# Patient Record
Sex: Female | Born: 1945
Health system: Southern US, Community
[De-identification: ages and names within clinical notes are randomized; demographics above are authoritative.]

## PROBLEM LIST (undated history)

## (undated) DIAGNOSIS — M1990 Unspecified osteoarthritis, unspecified site: Secondary | ICD-10-CM

## (undated) DIAGNOSIS — R197 Diarrhea, unspecified: Secondary | ICD-10-CM

## (undated) DIAGNOSIS — R202 Paresthesia of skin: Secondary | ICD-10-CM

## (undated) DIAGNOSIS — H269 Unspecified cataract: Secondary | ICD-10-CM

## (undated) DIAGNOSIS — M5126 Other intervertebral disc displacement, lumbar region: Secondary | ICD-10-CM

## (undated) DIAGNOSIS — K219 Gastro-esophageal reflux disease without esophagitis: Secondary | ICD-10-CM

## (undated) DIAGNOSIS — Z973 Presence of spectacles and contact lenses: Secondary | ICD-10-CM

## (undated) DIAGNOSIS — N879 Dysplasia of cervix uteri, unspecified: Secondary | ICD-10-CM

## (undated) DIAGNOSIS — H04123 Dry eye syndrome of bilateral lacrimal glands: Secondary | ICD-10-CM

## (undated) DIAGNOSIS — R200 Anesthesia of skin: Secondary | ICD-10-CM

## (undated) DIAGNOSIS — M545 Low back pain, unspecified: Secondary | ICD-10-CM

## (undated) DIAGNOSIS — I10 Essential (primary) hypertension: Secondary | ICD-10-CM

## (undated) DIAGNOSIS — T7840XA Allergy, unspecified, initial encounter: Secondary | ICD-10-CM

## (undated) DIAGNOSIS — M8580 Other specified disorders of bone density and structure, unspecified site: Secondary | ICD-10-CM

## (undated) DIAGNOSIS — H548 Legal blindness, as defined in USA: Secondary | ICD-10-CM

## (undated) DIAGNOSIS — R3911 Hesitancy of micturition: Secondary | ICD-10-CM

## (undated) DIAGNOSIS — R55 Syncope and collapse: Secondary | ICD-10-CM

## (undated) DIAGNOSIS — R350 Frequency of micturition: Secondary | ICD-10-CM

## (undated) DIAGNOSIS — R609 Edema, unspecified: Secondary | ICD-10-CM

## (undated) HISTORY — PX: LUMBAR LAMINECTOMY: SHX95

## (undated) HISTORY — DX: Other intervertebral disc displacement, lumbar region: M51.26

## (undated) HISTORY — DX: Edema, unspecified: R60.9

## (undated) HISTORY — DX: Other specified disorders of bone density and structure, unspecified site: M85.80

## (undated) HISTORY — DX: Dysplasia of cervix uteri, unspecified: N87.9

## (undated) HISTORY — DX: Low back pain: M54.5

## (undated) HISTORY — PX: UPPER GASTROINTESTINAL ENDOSCOPY: SHX188

## (undated) HISTORY — DX: Low back pain, unspecified: M54.50

## (undated) HISTORY — DX: Allergy, unspecified, initial encounter: T78.40XA

## (undated) HISTORY — PX: COLONOSCOPY: SHX174

## (undated) HISTORY — PX: VAGINAL HYSTERECTOMY: SUR661

---

## 1999-01-21 ENCOUNTER — Emergency Department (HOSPITAL_COMMUNITY): Admission: EM | Admit: 1999-01-21 | Discharge: 1999-01-21 | Payer: Self-pay | Admitting: Emergency Medicine

## 1999-01-26 ENCOUNTER — Other Ambulatory Visit: Admission: RE | Admit: 1999-01-26 | Discharge: 1999-01-26 | Payer: Self-pay | Admitting: Family Medicine

## 2000-09-25 ENCOUNTER — Other Ambulatory Visit: Admission: RE | Admit: 2000-09-25 | Discharge: 2000-09-25 | Payer: Self-pay | Admitting: Family Medicine

## 2004-03-21 ENCOUNTER — Other Ambulatory Visit: Admission: RE | Admit: 2004-03-21 | Discharge: 2004-03-21 | Payer: Self-pay | Admitting: Family Medicine

## 2004-03-26 ENCOUNTER — Encounter: Admission: RE | Admit: 2004-03-26 | Discharge: 2004-03-26 | Payer: Self-pay | Admitting: Orthopedic Surgery

## 2004-04-20 ENCOUNTER — Encounter: Admission: RE | Admit: 2004-04-20 | Discharge: 2004-04-20 | Payer: Self-pay

## 2004-05-16 ENCOUNTER — Ambulatory Visit: Payer: Self-pay | Admitting: Gastroenterology

## 2004-05-17 ENCOUNTER — Ambulatory Visit (HOSPITAL_COMMUNITY): Admission: RE | Admit: 2004-05-17 | Discharge: 2004-05-17 | Payer: Self-pay | Admitting: Gastroenterology

## 2004-05-17 ENCOUNTER — Ambulatory Visit: Payer: Self-pay | Admitting: Gastroenterology

## 2004-07-04 ENCOUNTER — Ambulatory Visit: Payer: Self-pay | Admitting: Gastroenterology

## 2005-04-08 ENCOUNTER — Ambulatory Visit: Payer: Self-pay | Admitting: Family Medicine

## 2005-04-16 ENCOUNTER — Encounter: Payer: Self-pay | Admitting: Family Medicine

## 2005-04-16 ENCOUNTER — Other Ambulatory Visit: Admission: RE | Admit: 2005-04-16 | Discharge: 2005-04-16 | Payer: Self-pay | Admitting: Family Medicine

## 2005-04-16 ENCOUNTER — Ambulatory Visit: Payer: Self-pay | Admitting: Family Medicine

## 2006-08-01 ENCOUNTER — Ambulatory Visit: Payer: Self-pay | Admitting: Family Medicine

## 2006-08-01 LAB — CONVERTED CEMR LAB
ALT: 17 U/L
AST: 20 U/L
Albumin: 3.8 g/dL
Alkaline Phosphatase: 67 U/L
BUN: 8 mg/dL
Basophils Absolute: 0 10*3/uL
Basophils Relative: 0.6 %
CO2: 31 meq/L
Calcium: 9.4 mg/dL
Chloride: 107 meq/L
Cholesterol: 190 mg/dL
Creatinine, Ser: 0.8 mg/dL
Eosinophils Relative: 4.3 %
GFR calc Af Amer: 94 mL/min
GFR calc non Af Amer: 78 mL/min
Glucose, Bld: 92 mg/dL
HCT: 40.6 %
HDL: 52.5 mg/dL
Hemoglobin: 13.7 g/dL
LDL Cholesterol: 112 mg/dL — ABNORMAL HIGH
Lymphocytes Relative: 31.6 %
MCHC: 33.8 g/dL
MCV: 94.2 fL
Monocytes Absolute: 0.7 10*3/uL
Monocytes Relative: 11.9 % — ABNORMAL HIGH
Neutro Abs: 3.2 10*3/uL
Neutrophils Relative %: 51.6 %
Platelets: 233 10*3/uL
Potassium: 3.8 meq/L
RBC: 4.31 M/uL
RDW: 11.9 %
Sodium: 142 meq/L
TSH: 0.99 u[IU]/mL
Total Bilirubin: 0.8 mg/dL
Total CHOL/HDL Ratio: 3.6
Total Protein: 6.5 g/dL
Triglycerides: 126 mg/dL
VLDL: 25 mg/dL
WBC: 6.2 10*3/uL

## 2006-08-12 ENCOUNTER — Ambulatory Visit: Payer: Self-pay | Admitting: Family Medicine

## 2007-03-30 DIAGNOSIS — J309 Allergic rhinitis, unspecified: Secondary | ICD-10-CM | POA: Insufficient documentation

## 2007-04-02 ENCOUNTER — Ambulatory Visit: Payer: Self-pay | Admitting: Family Medicine

## 2007-04-02 DIAGNOSIS — Z8679 Personal history of other diseases of the circulatory system: Secondary | ICD-10-CM | POA: Insufficient documentation

## 2007-05-20 ENCOUNTER — Ambulatory Visit: Payer: Self-pay | Admitting: Family Medicine

## 2007-08-31 ENCOUNTER — Ambulatory Visit: Payer: Self-pay | Admitting: Family Medicine

## 2007-08-31 LAB — CONVERTED CEMR LAB
Blood in Urine, dipstick: NEGATIVE
Glucose, Urine, Semiquant: NEGATIVE
Nitrite: NEGATIVE
Protein, U semiquant: NEGATIVE
Specific Gravity, Urine: >=1.03
Urobilinogen, UA: 0.2
WBC Urine, dipstick: NEGATIVE
pH: 5.5

## 2007-09-09 ENCOUNTER — Other Ambulatory Visit: Admission: RE | Admit: 2007-09-09 | Discharge: 2007-09-09 | Payer: Self-pay | Admitting: Family Medicine

## 2007-09-09 ENCOUNTER — Encounter: Payer: Self-pay | Admitting: Family Medicine

## 2007-09-09 ENCOUNTER — Ambulatory Visit: Payer: Self-pay | Admitting: Family Medicine

## 2007-09-09 DIAGNOSIS — R609 Edema, unspecified: Secondary | ICD-10-CM | POA: Insufficient documentation

## 2007-09-09 DIAGNOSIS — K589 Irritable bowel syndrome without diarrhea: Secondary | ICD-10-CM | POA: Insufficient documentation

## 2007-09-09 DIAGNOSIS — M129 Arthropathy, unspecified: Secondary | ICD-10-CM | POA: Insufficient documentation

## 2007-09-09 LAB — CONVERTED CEMR LAB
ALT: 19 U/L
AST: 21 U/L
Albumin: 3.7 g/dL
Alkaline Phosphatase: 63 U/L
BUN: 10 mg/dL
Basophils Absolute: 0 10*3/uL
Basophils Relative: 0.1 %
Bilirubin, Direct: 0.1 mg/dL
CO2: 30 meq/L
Calcium: 9.2 mg/dL
Chloride: 103 meq/L
Cholesterol: 205 mg/dL
Creatinine, Ser: 0.8 mg/dL
Direct LDL: 124.6 mg/dL
Eosinophils Absolute: 0.2 10*3/uL
Eosinophils Relative: 4.7 %
GFR calc Af Amer: 93 mL/min
GFR calc non Af Amer: 77 mL/min
Glucose, Bld: 91 mg/dL
HCT: 38.7 %
HDL: 49.3 mg/dL
Hemoglobin: 13 g/dL
Lymphocytes Relative: 35.6 %
MCHC: 33.5 g/dL
MCV: 94.3 fL
Monocytes Absolute: 0.5 10*3/uL
Monocytes Relative: 11.2 % — ABNORMAL HIGH
Neutro Abs: 2.4 10*3/uL
Neutrophils Relative %: 48.4 %
Pap Smear: NORMAL
Platelets: 199 10*3/uL
Potassium: 4.2 meq/L
RBC: 4.11 M/uL
RDW: 12 %
Sodium: 140 meq/L
TSH: 1.55 u[IU]/mL
Total Bilirubin: 0.8 mg/dL
Total CHOL/HDL Ratio: 4.2
Total Protein: 6.3 g/dL
Triglycerides: 139 mg/dL
VLDL: 28 mg/dL
WBC: 4.8 10*3/uL

## 2007-09-17 ENCOUNTER — Encounter: Payer: Self-pay | Admitting: Family Medicine

## 2007-09-30 ENCOUNTER — Encounter: Payer: Self-pay | Admitting: Family Medicine

## 2007-09-30 LAB — HM MAMMOGRAPHY

## 2007-11-26 ENCOUNTER — Ambulatory Visit: Payer: Self-pay | Admitting: Family Medicine

## 2008-04-04 ENCOUNTER — Ambulatory Visit: Payer: Self-pay | Admitting: Internal Medicine

## 2008-04-04 DIAGNOSIS — M545 Low back pain, unspecified: Secondary | ICD-10-CM | POA: Insufficient documentation

## 2008-04-04 DIAGNOSIS — M549 Dorsalgia, unspecified: Secondary | ICD-10-CM | POA: Insufficient documentation

## 2008-04-13 ENCOUNTER — Ambulatory Visit: Payer: Self-pay | Admitting: Family Medicine

## 2008-04-13 DIAGNOSIS — M5430 Sciatica, unspecified side: Secondary | ICD-10-CM | POA: Insufficient documentation

## 2008-04-13 DIAGNOSIS — M5126 Other intervertebral disc displacement, lumbar region: Secondary | ICD-10-CM | POA: Insufficient documentation

## 2008-04-15 ENCOUNTER — Encounter: Admission: RE | Admit: 2008-04-15 | Discharge: 2008-04-15 | Payer: Self-pay | Admitting: Family Medicine

## 2008-04-20 ENCOUNTER — Telehealth: Payer: Self-pay | Admitting: Family Medicine

## 2008-04-28 ENCOUNTER — Telehealth: Payer: Self-pay | Admitting: Family Medicine

## 2008-04-28 ENCOUNTER — Encounter: Payer: Self-pay | Admitting: Family Medicine

## 2008-04-28 ENCOUNTER — Ambulatory Visit (HOSPITAL_COMMUNITY): Admission: RE | Admit: 2008-04-28 | Discharge: 2008-04-29 | Payer: Self-pay | Admitting: Neurosurgery

## 2008-05-19 ENCOUNTER — Encounter: Payer: Self-pay | Admitting: Family Medicine

## 2008-06-17 ENCOUNTER — Encounter: Payer: Self-pay | Admitting: Family Medicine

## 2008-08-29 ENCOUNTER — Ambulatory Visit: Payer: Self-pay | Admitting: Family Medicine

## 2008-08-29 LAB — CONVERTED CEMR LAB
Bilirubin Urine: NEGATIVE
Blood in Urine, dipstick: NEGATIVE
Glucose, Urine, Semiquant: NEGATIVE
Ketones, urine, test strip: NEGATIVE
Nitrite: NEGATIVE
Protein, U semiquant: NEGATIVE
Specific Gravity, Urine: 1.015
Urobilinogen, UA: 0.2
WBC Urine, dipstick: NEGATIVE
pH: 5.5

## 2008-09-13 LAB — CONVERTED CEMR LAB
ALT: 17 U/L
AST: 20 U/L
Albumin: 3.8 g/dL
Alkaline Phosphatase: 65 U/L
BUN: 11 mg/dL
Basophils Absolute: 0 10*3/uL
Basophils Relative: 0.4 %
Bilirubin, Direct: 0.1 mg/dL
CO2: 29 meq/L
Calcium: 9.4 mg/dL
Chloride: 106 meq/L
Cholesterol: 188 mg/dL
Creatinine, Ser: 0.6 mg/dL
Eosinophils Absolute: 0.2 10*3/uL
Eosinophils Relative: 3.6 %
GFR calc Af Amer: 130 mL/min
GFR calc non Af Amer: 107 mL/min
Glucose, Bld: 90 mg/dL
HCT: 38.7 %
HDL: 54.4 mg/dL
Hemoglobin: 13.1 g/dL
LDL Cholesterol: 118 mg/dL — ABNORMAL HIGH
Lymphocytes Relative: 28.9 %
MCHC: 33.8 g/dL
MCV: 95.2 fL
Monocytes Absolute: 0.5 10*3/uL
Monocytes Relative: 9.9 %
Neutro Abs: 3 10*3/uL
Neutrophils Relative %: 57.2 %
Platelets: 174 10*3/uL
Potassium: 3.9 meq/L
RBC: 4.07 M/uL
RDW: 11.9 %
Sodium: 141 meq/L
TSH: 1.04 u[IU]/mL
Total Bilirubin: 0.8 mg/dL
Total CHOL/HDL Ratio: 3.5
Total Protein: 6.8 g/dL
Triglycerides: 77 mg/dL
VLDL: 15 mg/dL
Vit D, 25-Hydroxy: 39 ng/mL
WBC: 5.2 10*3/uL

## 2008-09-16 ENCOUNTER — Encounter: Payer: Self-pay | Admitting: Family Medicine

## 2008-09-28 ENCOUNTER — Ambulatory Visit: Payer: Self-pay | Admitting: Family Medicine

## 2008-09-28 DIAGNOSIS — L259 Unspecified contact dermatitis, unspecified cause: Secondary | ICD-10-CM | POA: Insufficient documentation

## 2008-10-18 ENCOUNTER — Encounter: Payer: Self-pay | Admitting: Family Medicine

## 2008-12-09 ENCOUNTER — Encounter (INDEPENDENT_AMBULATORY_CARE_PROVIDER_SITE_OTHER): Payer: Self-pay | Admitting: *Deleted

## 2009-01-20 ENCOUNTER — Ambulatory Visit: Payer: Self-pay | Admitting: Gastroenterology

## 2009-01-27 ENCOUNTER — Ambulatory Visit: Payer: Self-pay | Admitting: Gastroenterology

## 2009-01-27 LAB — HM COLONOSCOPY

## 2009-03-01 ENCOUNTER — Encounter: Payer: Self-pay | Admitting: Family Medicine

## 2009-04-12 ENCOUNTER — Ambulatory Visit: Payer: Self-pay | Admitting: Family Medicine

## 2009-06-07 ENCOUNTER — Ambulatory Visit: Payer: Self-pay | Admitting: Family Medicine

## 2009-06-07 DIAGNOSIS — M479 Spondylosis, unspecified: Secondary | ICD-10-CM | POA: Insufficient documentation

## 2009-06-22 ENCOUNTER — Telehealth: Payer: Self-pay | Admitting: Family Medicine

## 2009-06-27 ENCOUNTER — Ambulatory Visit: Payer: Self-pay | Admitting: Family Medicine

## 2009-07-04 ENCOUNTER — Encounter (INDEPENDENT_AMBULATORY_CARE_PROVIDER_SITE_OTHER): Payer: Self-pay | Admitting: *Deleted

## 2009-07-06 ENCOUNTER — Encounter: Admission: RE | Admit: 2009-07-06 | Discharge: 2009-07-06 | Payer: Self-pay | Admitting: Family Medicine

## 2009-07-11 ENCOUNTER — Encounter: Payer: Self-pay | Admitting: Family Medicine

## 2009-07-12 ENCOUNTER — Encounter: Payer: Self-pay | Admitting: Family Medicine

## 2009-08-08 ENCOUNTER — Encounter: Payer: Self-pay | Admitting: Family Medicine

## 2009-09-15 ENCOUNTER — Ambulatory Visit: Payer: Self-pay | Admitting: Family Medicine

## 2009-09-19 ENCOUNTER — Ambulatory Visit: Payer: Self-pay | Admitting: Family Medicine

## 2009-09-19 DIAGNOSIS — K5289 Other specified noninfective gastroenteritis and colitis: Secondary | ICD-10-CM | POA: Insufficient documentation

## 2009-09-19 LAB — CONVERTED CEMR LAB
ALT: 17 U/L
AST: 17 U/L
Albumin: 3.6 g/dL
Alkaline Phosphatase: 92 U/L
BUN: 19 mg/dL
Basophils Absolute: 0.1 10*3/uL
Basophils Relative: 0.9 %
Bilirubin, Direct: 0 mg/dL
CO2: 30 meq/L
Calcium: 9.2 mg/dL
Chloride: 106 meq/L
Cholesterol: 203 mg/dL — ABNORMAL HIGH
Creatinine, Ser: 0.8 mg/dL
Direct LDL: 136.7 mg/dL
Eosinophils Absolute: 0.1 10*3/uL
Eosinophils Relative: 2.2 %
GFR calc non Af Amer: 76.67 mL/min
Glucose, Bld: 85 mg/dL
HCT: 38.7 %
HDL: 60.6 mg/dL
Hemoglobin: 12.9 g/dL
Lymphocytes Relative: 27.8 %
Lymphs Abs: 1.8 10*3/uL
MCHC: 33.3 g/dL
MCV: 96.2 fL
Monocytes Absolute: 0.8 10*3/uL
Monocytes Relative: 12.3 % — ABNORMAL HIGH
Neutro Abs: 3.7 10*3/uL
Neutrophils Relative %: 56.8 %
Platelets: 322 10*3/uL
Potassium: 4 meq/L
RBC: 4.02 M/uL
RDW: 12.9 %
Sodium: 140 meq/L
TSH: 0.94 u[IU]/mL
Total Bilirubin: 0.3 mg/dL
Total CHOL/HDL Ratio: 3
Total Protein: 6.9 g/dL
Triglycerides: 72 mg/dL
VLDL: 14.4 mg/dL
Vit D, 25-Hydroxy: 34 ng/mL
WBC: 6.5 10*3/uL

## 2009-09-21 ENCOUNTER — Telehealth: Payer: Self-pay | Admitting: Family Medicine

## 2009-09-22 ENCOUNTER — Encounter: Payer: Self-pay | Admitting: Family Medicine

## 2009-10-03 ENCOUNTER — Other Ambulatory Visit: Admission: RE | Admit: 2009-10-03 | Discharge: 2009-10-03 | Payer: Self-pay | Admitting: Family Medicine

## 2009-10-03 ENCOUNTER — Ambulatory Visit: Payer: Self-pay | Admitting: Family Medicine

## 2009-10-03 LAB — CONVERTED CEMR LAB
Bilirubin Urine: NEGATIVE
Blood in Urine, dipstick: NEGATIVE
Glucose, Urine, Semiquant: NEGATIVE
Ketones, urine, test strip: NEGATIVE
Nitrite: NEGATIVE
Protein, U semiquant: NEGATIVE
Specific Gravity, Urine: >=1.03
Urobilinogen, UA: 0.2
WBC Urine, dipstick: NEGATIVE
pH: 5.5

## 2009-10-03 LAB — HM PAP SMEAR

## 2009-10-05 ENCOUNTER — Telehealth: Payer: Self-pay | Admitting: Family Medicine

## 2009-10-05 ENCOUNTER — Encounter: Payer: Self-pay | Admitting: Family Medicine

## 2009-10-25 ENCOUNTER — Encounter: Payer: Self-pay | Admitting: Family Medicine

## 2009-11-01 ENCOUNTER — Encounter: Payer: Self-pay | Admitting: Family Medicine

## 2010-08-12 LAB — CONVERTED CEMR LAB: Pap Smear: NEGATIVE

## 2010-08-16 NOTE — Letter (Signed)
 Summary: [facility]  [facility]   Imported By: Mliss Shutter 03/17/2009 08:21:19  _____________________________________________________________________  External Attachment:    Type:   Image     Comment:   External Document

## 2010-08-16 NOTE — Letter (Signed)
 Summary: Authorizaton for [employer] for [employer] Provider: This provider was preselected by the workflow.  Signature: The signature status of this document was preset by the workflow  Processed by [employer] Local Indexer Client @ Thursday, July 13, 2009 8:56:14 AM using version:2010.1.2.11(2.4)   Manually Indexed By: [id number]  idlBatchDetail: [id number]   _____________________________________________________________________  External Attachment:    Type:   Image     Comment:   External Document

## 2010-08-16 NOTE — Letter (Signed)
 Summary: [facility]  [facility]   Imported By: Mliss Shutter 07/27/2008 15:46:29  _____________________________________________________________________  External Attachment:    Type:   Image     Comment:   External Document

## 2010-08-16 NOTE — Letter (Signed)
 Summary: Recall Colonoscopy Letter  [facility]  [address] [place], [place] [address]   Phone: [phone]  Fax: [phone]      Dec 09, 2008 MRN: [id number]   [name] [place], [place]  [address]   Dear Ms. Satter,   According to your medical record, it is time for you to schedule a Colonoscopy. The American Cancer Society recommends this procedure as a method to detect early colon cancer. Patients with a family history of colon cancer, or a personal history of colon polyps or inflammatory bowel disease are at increased risk.  This letter has beeen generated based on the recommendations made at the time of your procedure. If you feel that in your particular situation this may no longer apply, please contact our office.  Please call our office at [phone] to schedule this appointment or to update your records at your earliest convenience.  Thank you for cooperating with us  to provide you with the very best care possible.   Sincerely,  Alm SAUNDERS. Jakie, M.D.  [facility] [phone]

## 2010-08-16 NOTE — Assessment & Plan Note (Signed)
 Summary: back pain/njr   Vital Signs:  Patient profile:   65 year old female Weight:      145 pounds O2 Sat:      97 % Temp:     97.5 degrees F Pulse rate:   79 / minute Pulse rhythm:   regular BP sitting:   130 / 80  (left arm)  Vitals Entered By: Angeline KANDICE Pellet, RN (June 07, 2009 9:47 AM) CC: left leg ?sciatic and rt leg painful some   History of Present Illness: This 66 year old white female, Vert pleasant however complains of rather severe back pain over the lumbar region with radiation down the left leg. Patient has had back pain since approximately April 28, 2008, had lumbar laminectomy with some improvement however the pain has not ever completely cleared  The patient had increased severity of pain approximately 2 weeks ago with radiation of pain down the left leg of a sciatic distribution, no pain on coughing, no pain radiating down the right leg Patient had been taking only one Celebrex 200 mg capsule daily but has also been taking some hydrocodone  for relief Patient relates she has a bladder problem with some urgency at time in the nose times difficult urination but would like to return for another office visit to evaluatem this problem Blood pressure control  Preventive Screening-Counseling & Management  Alcohol-Tobacco     Smoking Status: quit > 6 months  Allergies (verified): No Known Drug Allergies  Past History:  Risk Factors: Smoking Status: quit > 6 months (06/07/2009)  Past medical, surgical, family and social histories (including risk factors) reviewed, and no changes noted (except as noted below).  Past Medical History: Allergic rhinitis Low back pain herniated lumbar disc  Past Surgical History: Colonoscopy CB x1 Hysterectomy lumbar laminectomy  Family History: Reviewed history and no changes required.  Social History: Reviewed history from 03/30/2007 and no changes required. Former Smoker Smoking Status:  quit > 6 months  Review of  Systems      See HPI General:  Denies chills, fatigue, fever, loss of appetite, malaise, sleep disorder, sweats, weakness, and weight loss. ENT:  Denies decreased hearing, difficulty swallowing, ear discharge, earache, hoarseness, nasal congestion, nosebleeds, postnasal drainage, ringing in ears, sinus pressure, and sore throat. CV:  Denies bluish discoloration of lips or nails, chest pain or discomfort, difficulty breathing at night, difficulty breathing while lying down, fainting, fatigue, leg cramps with exertion, lightheadness, near fainting, palpitations, shortness of breath with exertion, swelling of feet, swelling of hands, and weight gain. Resp:  Denies chest discomfort, chest pain with inspiration, cough, coughing up blood, excessive snoring, hypersomnolence, morning headaches, pleuritic, shortness of breath, sputum productive, and wheezing. GI:  Denies abdominal pain, bloody stools, change in bowel habits, constipation, dark tarry stools, diarrhea, excessive appetite, gas, hemorrhoids, indigestion, loss of appetite, nausea, vomiting, vomiting blood, and yellowish skin color. GU:  Complains of urinary hesitancy; at times urinary urgency. MS:  See HPI; Complains of low back pain.  Physical Exam  General:  Well-developed,well-nourished,in no acute distress; alert,appropriate and cooperative throughout examination Lungs:  Normal respiratory effort, chest expands symmetrically. Lungs are clear to auscultation, no crackles or wheezes. Heart:  Normal rate and regular rhythm. S1 and S2 normal without gallop, murmur, click, rub or other extra sounds. Abdomen:  Bowel sounds positive,abdomen soft and non-tender without masses, organomegaly or hernias noted. Genitalia:  not examined Msk:  Tenderness over the lumbar spine as well as the left sacroiliac joint. No limitation straight leg raise  and Neurologic:  1+ knee reflex bilaterally, absent ankle reflex on the left   Impression &  Recommendations:  Problem # 1:  SCIATICA, ACUTE (ICD-724.3) Assessment Deteriorated  Her updated medication list for this problem includes:    Baby Aspirin  81 Mg Chew (Aspirin )    Hydrocodone -acetaminophen  10-650 Mg Tabs (Hydrocodone -acetaminophen ) .SABRA... 1/2 -1 q4h as needed pain not to exceed 4 per day    Celebrex 200 Mg Caps (Celecoxib) .SABRA... 1 two times a day for arthritis  Orders: Depo- Medrol  80mg  (J1040) Depo- Medrol  40mg  (J1030) Admin of Therapeutic Inj  intramuscular or subcutaneous (03627)  Problem # 2:  HERNIATED LUMBOSACRAL DISC (ICD-722.10) Assessment: Improved  Problem # 3:  LOW BACK PAIN (ICD-724.2) Assessment: Deteriorated  Her updated medication list for this problem includes:    Baby Aspirin  81 Mg Chew (Aspirin )    Hydrocodone -acetaminophen  10-650 Mg Tabs (Hydrocodone -acetaminophen ) .SABRA... 1/2 -1 q4h as needed pain not to exceed 4 per day    Celebrex 200 Mg Caps (Celecoxib) .SABRA... 1 two times a day for arthritis  Problem # 4:  LOW BACK PAIN (ICD-724.2) Assessment: Improved  Her updated medication list for this problem includes:    Baby Aspirin  81 Mg Chew (Aspirin )    Hydrocodone -acetaminophen  10-650 Mg Tabs (Hydrocodone -acetaminophen ) .SABRA... 1/2 -1 q4h as needed pain not to exceed 4 per day    Celebrex 200 Mg Caps (Celecoxib) .SABRA... 1 two times a day for arthritis  Problem # 5:  HYPERTENSION, LABILE, HX OF (ICD-V12.50) Assessment: Improved  Problem # 6:  DEGENERATIVE JOINT DISEASE, LUMBAR SPINE (ICD-721.90) Assessment: Deteriorated  Complete Medication List: 1)  Baby Aspirin  81 Mg Chew (Aspirin ) 2)  Maxzide-25 37.5-25 Mg Tabs (Triamterene-hctz) .... Take 1 once daily 3)  Estrace  1 Mg Tabs (Estradiol ) .SABRA.. 1 once daily 4)  Hydrocodone -acetaminophen  10-650 Mg Tabs (Hydrocodone -acetaminophen ) .... 1/2 -1 q4h as needed pain not to exceed 4 per day 5)  Celebrex 200 Mg Caps (Celecoxib) .SABRA.. 1 two times a day for arthritis 6)  Klor-con  M20 20 Meq Tbcr (Potassium  chloride crys cr) .... Take 1 by mouth once daily 7)  Hydroxyzine Hcl 10 Mg Tabs (Hydroxyzine hcl) .SABRA.. 1 by mouth two times a day 8)  Lotrisone 1-0.05 % Crea (Clotrimazole-betamethasone) .... Apply two times a day for at least 14 days repeaat prn 9)  Lyrica 50 Mg Caps (Pregabalin) .SABRA.. 1 three times a day pc  Patient Instructions: 1)  I think you have degenerative joint disease with some residual arthritic inflammation secondary to surgery. Sciatica is also present 2)  Increase Celebrex to one twice daily 3)  Depo-Medrol  120 mg IM for inflammation 4)  Will start Lyrica 50 mg 3 times daily to help relieve neuropathy and pain 5)  Return in 2 weeks for reevaluation and also to evaluate the bladder problem Prescriptions: HYDROCODONE -ACETAMINOPHEN  10-650 MG  TABS (HYDROCODONE -ACETAMINOPHEN ) 1/2 -1 q4h as needed pain not to exceed 4 per day  #100 x 5   Entered and Authorized by:   Marsha JONELLE Viviann Mickey MD   Signed by:   Marsha JONELLE Viviann Mickey MD on 06/07/2009   Method used:   Print then Give to Patient   RxID:   [id number] LYRICA 50 MG CAPS (PREGABALIN) 1 three times a day pc  #90 x 11   Entered and Authorized by:   Marsha JONELLE Viviann Mickey MD   Signed by:   Marsha JONELLE Viviann Mickey MD on 06/07/2009   Method used:   Print then Give to Patient  RxID:   [id number] CELEBREX 200 MG  CAPS (CELECOXIB) 1 two times a day for arthritis  #60 x 11   Entered and Authorized by:   Marsha JONELLE Viviann Mickey MD   Signed by:   Marsha JONELLE Viviann Mickey MD on 06/07/2009   Method used:   Electronically to        [facility] [phone]* (retail)       [address]       [place], [place]  [address]       Ph: [phone] or [phone]       Fax: [phone]   RxID:   [id number]    Medication Administration  Injection # 1:    Medication: Depo- Medrol  80mg     Diagnosis: SCIATICA, ACUTE (ICD-724.3)    Route: IM    Site: RUOQ gluteus    Exp Date: 04/2010    Lot #: [id number]    Mfr: teva    Patient tolerated  injection without complications    Given by: Angeline KANDICE Pellet, RN (June 07, 2009 11:14 AM)  Injection # 2:    Medication: Depo- Medrol  40mg     Diagnosis: SCIATICA, ACUTE (ICD-724.3)    Route: IM    Site: RUOQ gluteus    Exp Date: 04/2010    Lot #: [id number]    Mfr: teva    Given by: Angeline KANDICE Pellet, RN (June 07, 2009 11:14 AM)  Orders Added: 1)  Depo- Medrol  80mg  [J1040] 2)  Depo- Medrol  40mg  [J1030] 3)  Admin of Therapeutic Inj  intramuscular or subcutaneous [96372] 4)  Est. Patient Level IV [00785]

## 2010-08-16 NOTE — Letter (Signed)
 Summary: [facility]  [facility]   Imported By: Mliss Shutter 06/17/2008 13:25:05  _____________________________________________________________________  External Attachment:    Type:   Image     Comment:   External Document

## 2010-08-16 NOTE — Progress Notes (Signed)
Summary: LETTER FOR DISABILITY   Phone Note Call from Patient   Caller: Patient  [phone] Reason for Call: Talk to Nurse, Talk to Doctor Summary of Call: Pt called to adv that her lawyer has advised her that she needs to get a documented notation on letterhead advising what he has treated pt for and what dx / problems that Dr Scotty Court is aware of, and what pts functional and non-exertional limitations are.  Pt will p/u letter when it is finished... Pt can be reached at [phone]  or  [phone] when same is ready.  Initial call taken by: Debbra Riding,  October 05, 2009 2:38 PM  Follow-up for Phone Call        dr Scotty Court said needs to get this thru her neurosurgeon  Follow-up by: Pura Spice, RN,  October 10, 2009 9:53 AM  Additional Follow-up for Phone Call Additional follow up Details #1::        Pt. notified. Additional Follow-up by: Lynann Beaver CMA,  October 10, 2009 9:55 AM

## 2010-08-16 NOTE — Assessment & Plan Note (Signed)
Summary: cpx/cjr   Vital Signs:  Patient profile:   65 year old female Height:      66 inches Weight:      135 pounds BMI:     21.87 O2 Sat:      97 % Temp:     98.5 degrees F Pulse rate:   108 / minute BP sitting:   120 / 80  (left arm)  Vitals Entered By: Pura Spice, RN (October 03, 2009 1:55 PM) CC: CPX PARTIAL HYST. 1980  Is Patient Diabetic? No   History of Present Illness: This 65 year old female is in for a physical examination as well as discuss her problems with her back since she has had surgery for herniated lumbar disc and since then has received epidural injections appeared she injured herself as far as her back on 17 March and has had some pain but is improved now she had excellent results with the last epidural injection. She complains of leg cramping at night which responds to call plan. Her other complaint is that of urinary frequency which is fine her previous weight was at best fair and will restart this medication. Had a hysterectomy in 1980 and this time for Pap smear Her arthritic pain is felt with Celebrex but at times he does need hydrocodone. We discussed the fact it may be beneficial for her to continue the estradiol 1 mg H. Her episodes of diarrhea and irritable bowel was controlled with Hyoscyamione she is scheduled to see her neurosurgeon regarding future surgery which I do not think she needs to stop and he also agrees  EKG  Procedure date:  10/03/2009  Findings:       sinus rhythm with rate of:  77 possible left atrial abnormality possible old anterior infarct low qrs voltage in limb leads    Problems Prior to Update: 1)  Gastroenteritis, Acute  (ICD-558.9) 2)  Herniated Lumbosacral Disc  (ICD-722.10) 3)  Degenerative Joint Disease, Lumbar Spine  (ICD-721.90) 4)  Eczema  (ICD-692.9) 5)  Physical Examination  (ICD-V70.0) 6)  Sciatica, Acute  (ICD-724.3) 7)  Herniated Lumbosacral Disc  (ICD-722.10) 8)  Back Pain  (ICD-724.5) 9)  Low Back Pain   (ICD-724.2) 10)  Ibs  (ICD-564.1) 11)  Edema  (ICD-782.3) 12)  Arthritis  (ICD-716.90) 13)  Hypertension, Labile, Hx of  (ICD-V12.50) 14)  Allergic Rhinitis  (ICD-477.9)  Allergies: No Known Drug Allergies  Past History:  Past Medical History: Last updated: 06/07/2009 Allergic rhinitis Low back pain herniated lumbar disc  Past Surgical History: Last updated: 06/07/2009 Colonoscopy CB x1 Hysterectomy lumbar laminectomy  Social History: Last updated: 03/30/2007 Former Smoker  Risk Factors: Smoking Status: quit > 6 months (06/07/2009)  Review of Systems  The patient denies anorexia, fever, weight loss, weight gain, vision loss, decreased hearing, hoarseness, chest pain, syncope, dyspnea on exertion, peripheral edema, prolonged cough, headaches, hemoptysis, abdominal pain, melena, hematochezia, severe indigestion/heartburn, hematuria, incontinence, genital sores, muscle weakness, suspicious skin lesions, transient blindness, difficulty walking, depression, unusual weight change, abnormal bleeding, enlarged lymph nodes, angioedema, breast masses, and testicular masses.    Physical Exam  General:  Well-developed,well-nourished,in no acute distress; alert,appropriate and cooperative throughout examination Head:  Normocephalic and atraumatic without obvious abnormalities. No apparent alopecia or balding. Eyes:  No corneal or conjunctival inflammation noted. EOMI. Perrla. Funduscopic exam benign, without hemorrhages, exudates or papilledema. Vision grossly normal. Ears:  External ear exam shows no significant lesions or deformities.  Otoscopic examination reveals clear canals, tympanic membranes are intact bilaterally  without bulging, retraction, inflammation or discharge. Hearing is grossly normal bilaterally. Nose:  External nasal examination shows no deformity or inflammation. Nasal mucosa are pink and moist without lesions or exudates. Mouth:  Oral mucosa and oropharynx without  lesions or exudates.  Teeth in good repair. Neck:  No deformities, masses, or tenderness noted. Chest Wall:  No deformities, masses, or tenderness noted. Breasts:  No mass, nodules, thickening, tenderness, bulging, retraction, inflamation, nipple discharge or skin changes noted.   Lungs:  Normal respiratory effort, chest expands symmetrically. Lungs are clear to auscultation, no crackles or wheezes. Heart:  Normal rate and regular rhythm. S1 and S2 normal without gallop, murmur, click, rub or other extra sounds. Abdomen:  Bowel sounds positive,abdomen soft and non-tender without masses, organomegaly or hernias noted. Rectal:  No external abnormalities noted. Normal sphincter tone. No rectal masses or tenderness. Genitalia:  Normal introitus for age, no external lesions, no vaginal discharge, mucosa pink and moist, no vaginal or cervical lesions, no vaginal atrophy, no friaility or hemorrhage, absent uterus no adnexal masses or tenderness Msk:  No deformity or scoliosis noted of thoracic or lumbar spine.  laminectomy scar but no symptoms of signs of back surgery although was Pulses:  R and L carotid,radial,femoral,dorsalis pedis and posterior tibial pulses are full and equal bilaterally Extremities:  No clubbing, cyanosis, edema, or deformity noted with normal full range of motion of all joints.   Neurologic:  No cranial nerve deficits noted. Station and gait are normal. Plantar reflexes are down-going bilaterally. DTRs are symmetrical throughout. Sensory, motor and coordinative functions appear intact. Skin:  Intact without suspicious lesions or rashes Cervical Nodes:  No lymphadenopathy noted Axillary Nodes:  No palpable lymphadenopathy Inguinal Nodes:  No significant adenopathy Psych:  Cognition and judgment appear intact. Alert and cooperative with normal attention span and concentration. No apparent delusions, illusions, hallucinations   Impression & Recommendations:  Problem # 1:   HERNIATED LUMBOSACRAL DISC (ICD-722.10) Assessment Improved  Problem # 2:  PHYSICAL EXAMINATION (ICD-V70.0) Assessment: Unchanged  Orders: EKG w/ Interpretation (93000)no abnormalities noted  Problem # 3:  LOW BACK PAIN (ICD-724.2) Assessment: Improved  Her updated medication list for this problem includes:    Baby Aspirin 81 Mg Chew (Aspirin)    Hydrocodone-acetaminophen 10-650 Mg Tabs (Hydrocodone-acetaminophen) .Marland Kitchen... 1/2 -1 q4h as needed pain not to exceed 4 per day    Celebrex 200 Mg Caps (Celecoxib) .Marland Kitchen... 1 two times a day for arthritis  Problem # 4:  ECZEMA (ICD-692.9) Assessment: Improved  Problem # 5:  IBS (ICD-564.1) Assessment: Improved  Problem # 6:  HYPERTENSION, LABILE, HX OF (ICD-V12.50) Assessment: Improved  Complete Medication List: 1)  Baby Aspirin 81 Mg Chew (Aspirin) 2)  Maxzide-25 37.5-25 Mg Tabs (Triamterene-hctz) .... Take 1 once daily 3)  Estrace 1 Mg Tabs (Estradiol) .Marland Kitchen.. 1 once daily 4)  Hydrocodone-acetaminophen 10-650 Mg Tabs (Hydrocodone-acetaminophen) .... 1/2 -1 q4h as needed pain not to exceed 4 per day 5)  Celebrex 200 Mg Caps (Celecoxib) .Marland Kitchen.. 1 two times a day for arthritis 6)  Klor-con M20 20 Meq Tbcr (Potassium chloride crys cr) .... Take 1 by mouth once daily 7)  Hydroxyzine Hcl 10 Mg Tabs (Hydroxyzine hcl) .Marland Kitchen.. 1 by mouth two times a day 8)  Lotrisone 1-0.05 % Crea (Clotrimazole-betamethasone) .... Apply two times a day for at least 14 days repeaat prn 9)  Lyrica 50 Mg Caps (Pregabalin) .Marland Kitchen.. 1 three times a day pc 10)  Qualquin 325 Mg  .Marland Kitchen.. 1 each day to prevent  muscle cramps 11)  Hyoscyamine Sulfate 0.125 Mg Tabs (Hyoscyamine sulfate) .... 2 stat then 1 qid ac hs to prevent diarrhea and irritable bowel 12)  Nexium 40 Mg Cpdr (Esomeprazole magnesium) .Marland Kitchen.. 1 stat repeat tonight then 1 qd 13)  Vesicare 10 Mg Tabs (Solifenacin succinate) .Marland Kitchen.. 1 each day for urinary frequency  Patient Instructions: 1)  at this time of acute overwhelmed and  dictated on a daily back surgery. Continue trial of epidural injections for her back problem and I think he would do well 2)  Refill medications 3)  Physical examination revealed a healthy female Prescriptions: NEXIUM 40 MG CPDR (ESOMEPRAZOLE MAGNESIUM) 1 stat repeat tonight then 1 qd  #90 x 3   Entered and Authorized by:   Judithann Sheen MD   Signed by:   Judithann Sheen MD on 10/03/2009   Method used:   Print then Give to Patient   RxID:   [id number] QUALQUIN 325 MG 1 each day to prevent muscle cramps  #90 x 3   Entered and Authorized by:   Judithann Sheen MD   Signed by:   Judithann Sheen MD on 10/03/2009   Method used:   Print then Give to Patient   RxID:   [id number] HYDROXYZINE HCL 10 MG TABS (HYDROXYZINE HCL) 1 by mouth two times a day  #180 x 3   Entered and Authorized by:   Judithann Sheen MD   Signed by:   Judithann Sheen MD on 10/03/2009   Method used:   Print then Give to Patient   RxID:   [id number] KLOR-CON M20 20 MEQ  TBCR (POTASSIUM CHLORIDE CRYS CR) take 1 by mouth once daily  #90 x 3   Entered and Authorized by:   Judithann Sheen MD   Signed by:   Judithann Sheen MD on 10/03/2009   Method used:   Print then Give to Patient   RxID:   [id number] CELEBREX 200 MG  CAPS (CELECOXIB) 1 two times a day for arthritis  #180 x 3   Entered and Authorized by:   Judithann Sheen MD   Signed by:   Judithann Sheen MD on 10/03/2009   Method used:   Print then Give to Patient   RxID:   [id number] ESTRACE 1 MG  TABS (ESTRADIOL) 1 once daily  #90 x 3   Entered and Authorized by:   Judithann Sheen MD   Signed by:   Judithann Sheen MD on 10/03/2009   Method used:   Print then Give to Patient   RxID:   [id number]

## 2010-08-16 NOTE — Letter (Signed)
Summary: Results Follow-up Letter  [place] [facility]  [address] [place], [place] [address]   Phone: [phone]  Fax: [phone]    10/05/2009  [address] [place], [place]  [address]  Dear Ms. Quiros,   The following are the results of your recent test(s):  Test     Result     Pap Smear    Normal____yes ___       Sincerely,      Dr Raoul Pitch MD  [place] [facility]

## 2010-08-16 NOTE — Medication Information (Signed)
Summary: Exception Request for Dexilant  Exception Request for Dexilant   Imported By: Maryln Gottron 10/26/2009 13:53:31  _____________________________________________________________________  External Attachment:    Type:   Image     Comment:   External Document

## 2010-08-16 NOTE — Assessment & Plan Note (Signed)
 Summary: cpx/ccm   Vital Signs:  Patient Profile:   65 Years Old Female Weight:      137 pounds Temp:     98.2 degrees F Pulse rate:   72 / minute BP sitting:   120 / 80  (left arm) Cuff size:   regular  Vitals Entered By: Angeline KANDICE Pellet, RN (September 09, 2007 10:34 AM)                 Chief Complaint:  cpx  labs already drawn.SABRA  History of Present Illness: Pt in for yearly evaluation doing well, under some stress with husband and mother increased gas and abd pain at times with occasional episode of diarrhea    Current Allergies: No known allergies      Review of Systems      See HPI  General      Denies chills, fatigue, fever, loss of appetite, malaise, sleep disorder, sweats, weakness, and weight loss.  ENT      Denies decreased hearing, difficulty swallowing, ear discharge, earache, hoarseness, nasal congestion, nosebleeds, postnasal drainage, ringing in ears, sinus pressure, and sore throat.  CV      Denies bluish discoloration of lips or nails, chest pain or discomfort, difficulty breathing at night, difficulty breathing while lying down, fainting, fatigue, leg cramps with exertion, lightheadness, near fainting, palpitations, shortness of breath with exertion, swelling of feet, swelling of hands, and weight gain.  Resp      Denies chest discomfort, chest pain with inspiration, cough, coughing up blood, excessive snoring, hypersomnolence, morning headaches, pleuritic, shortness of breath, sputum productive, and wheezing.  GI      Complains of abdominal pain and hemorrhoids.      Denies bloody stools, constipation, dark tarry stools, diarrhea, and gas.      increased flatus, occasional diarrhea compatable with IBS  GU      Denies abnormal vaginal bleeding, decreased libido, discharge, dysuria, genital sores, hematuria, incontinence, nocturia, urinary frequency, and urinary hesitancy.  MS      Denies joint pain, joint redness, joint swelling, loss of  strength, low back pain, mid back pain, muscle aches, muscle , cramps, muscle weakness, stiffness, and thoracic pain.  Derm      Denies changes in color of skin, changes in nail beds, dryness, excessive perspiration, flushing, hair loss, insect bite(s), itching, lesion(s), poor wound healing, and rash.  Neuro      Denies brief paralysis, difficulty with concentration, disturbances in coordination, falling down, headaches, inability to speak, memory loss, numbness, poor balance, seizures, sensation of room spinning, tingling, tremors, visual disturbances, and weakness.  Psych      Denies alternate hallucination ( auditory/visual), anxiety, depression, easily angered, easily tearful, irritability, mental problems, panic attacks, sense of great danger, suicidal thoughts/plans, thoughts of violence, unusual visions or sounds, and thoughts /plans of harming others.      some stress or anxiety  Endo      Denies cold intolerance, excessive hunger, excessive thirst, excessive urination, heat intolerance, polyuria, and weight change.  Heme      Denies abnormal bruising, bleeding, enlarge lymph nodes, fevers, pallor, and skin discoloration.  Allergy      Denies hives or rash, itching eyes, persistent infections, seasonal allergies, and sneezing.  ENT      Denies decreased hearing, difficulty swallowing, ear discharge, earache, hoarseness, nasal congestion, nosebleeds, postnasal drainage, ringing in ears, sinus pressure, and sore throat.   Physical Exam  General:     Well-developed,well-nourished,in no acute  distress; alert,appropriate and cooperative throughout examination Head:     Normocephalic and atraumatic without obvious abnormalities. No apparent alopecia or balding. Eyes:     No corneal or conjunctival inflammation noted. EOMI. Perrla. Funduscopic exam benign, without hemorrhages, exudates or papilledema. Vision grossly normal. Ears:     External ear exam shows no significant lesions  or deformities.  Otoscopic examination reveals clear canals, tympanic membranes are intact bilaterally without bulging, retraction, inflammation or discharge. Hearing is grossly normal bilaterally. Nose:     External nasal examination shows no deformity or inflammation. Nasal mucosa are pink and moist without lesions or exudates. Mouth:     Oral mucosa and oropharynx without lesions or exudates.  Teeth in good repair. Neck:     No deformities, masses, or tenderness noted. Chest Wall:     No deformities, masses, or tenderness noted. Breasts:     No mass, nodules, thickening, tenderness, bulging, retraction, inflamation, nipple discharge or skin changes noted.   Lungs:     Normal respiratory effort, chest expands symmetrically. Lungs are clear to auscultation, no crackles or wheezes. Heart:     Normal rate and regular rhythm. S1 and S2 normal without gallop, murmur, click, rub or other extra sounds. Abdomen:     bowel sounds hyperactive.   Rectal:     No external abnormalities noted. Normal sphincter tone. No rectal masses or tenderness. Genitalia:     absent uterus pap done Msk:     No deformity or scoliosis noted of thoracic or lumbar spine.   Pulses:     R and L carotid,radial,femoral,dorsalis pedis and posterior tibial pulses are full and equal bilaterally Extremities:     No clubbing, cyanosis, edema, or deformity noted with normal full range of motion of all joints.   Neurologic:     No cranial nerve deficits noted. Station and gait are normal. Plantar reflexes are down-going bilaterally. DTRs are symmetrical throughout. Sensory, motor and coordinative functions appear intact. Skin:     Intact without suspicious lesions or rashes Cervical Nodes:     No lymphadenopathy noted Axillary Nodes:     No palpable lymphadenopathy Inguinal Nodes:     No significant adenopathy Psych:     Cognition and judgment appear intact. Alert and cooperative with normal attention span and  concentration. No apparent delusions, illusions, hallucinations    Complete Medication List: 1)  Diclofenac Sodium 75 Mg Tbec (Diclofenac sodium) .SABRA.. 1 tab two times a day 2)  Baby Aspirin  81 Mg Chew (Aspirin ) 3)  Alprazolam  0.25 Mg Tabs (Alprazolam ) .SABRA.. 1 tab three times a day for stress 4)  Maxzide-25 37.5-25 Mg Tabs (Triamterene-hctz) .... Take 1 once daily 5)  Estrace  1 Mg Tabs (Estradiol ) .SABRA.. 1 once daily 6)  Hyoscyamine Sulfate Ir/sr 0.375 Mg Tbcr (Hyoscyamine sulfate) .SABRA.. 1 tab two times a day  fir irritable bowel     Prescriptions: VALIUM 5 MG  TABS (DIAZEPAM) q1 three times a day  as needed  for agitation  #90 x 1   Entered and Authorized by:   Marsha JONELLE Viviann Mickey MD   Signed by:   Marsha JONELLE Viviann Mickey MD on 09/09/2007   Method used:   Print then Give to Patient   RxID:   [id number] HYOSCYAMINE SULFATE IR/SR 0.375 MG  TBCR (HYOSCYAMINE SULFATE) 1 tab two times a day  fir irritable bowel  #6o x 11   Entered and Authorized by:   Marsha JONELLE Viviann Mickey MD   Signed by:  Marsha JONELLE Viviann Mickey MD on 09/09/2007   Method used:   Electronically sent to ...       [id number]       [address]       [place], [place]  [address]       Ph: [phone] or [phone]       Fax: [phone]   RxID:   [id number] ALPRAZOLAM  0.25 MG  TABS (ALPRAZOLAM ) 1 tab three times a day for stress  #90 x 5   Entered and Authorized by:   Marsha JONELLE Viviann Mickey MD   Signed by:   Marsha JONELLE Viviann Mickey MD on 09/09/2007   Method used:   Print then Give to Patient   RxID:   [id number] DICLOFENAC SODIUM 75 MG  TBEC (DICLOFENAC SODIUM) 1 tab two times a day  #60 x 11   Entered and Authorized by:   Marsha JONELLE Viviann Mickey MD   Signed by:   Marsha JONELLE Viviann Mickey MD on 09/09/2007   Method used:   Electronically sent to ...       [facility]  [address] [id number]       [address]       [place], [place]  [address]       Ph: [phone] or [phone]       Fax: [phone]   RxID:    [id number] ESTRACE  1 MG  TABS (ESTRADIOL ) 1 once daily  #30 x 11   Entered and Authorized by:   Marsha JONELLE Viviann Mickey MD   Signed by:   Marsha JONELLE Viviann Mickey MD on 09/09/2007   Method used:   Electronically sent to ...       [facility] [address] [id number]       [address]       [place], [place]  [address]       Ph: [phone] or [phone]       Fax: [phone]   RxID:   [id number] MAXZIDE-25 37.5-25 MG  TABS (TRIAMTERENE-HCTZ) take 1 once daily  #90 x 3   Entered by:   Angeline KANDICE Pellet, RN   Authorized by:   Marsha JONELLE Viviann Mickey MD   Signed by:   Marsha JONELLE Viviann Mickey MD on 09/09/2007   Method used:   Print then Give to Patient   RxID:   [id number]  ]

## 2010-08-16 NOTE — Consult Note (Signed)
Summary: [facility]  [facility]   Imported By: Maryln Gottron 08/10/2009 14:00:13  _____________________________________________________________________  External Attachment:    Type:   Image     Comment:   External Document

## 2010-08-16 NOTE — Letter (Signed)
Summary: [facility]  [facility]   Imported By: Maryln Gottron 10/06/2009 13:54:21  _____________________________________________________________________  External Attachment:    Type:   Image     Comment:   External Document

## 2010-08-16 NOTE — Letter (Signed)
 Summary: Results Follow-up Letter  [place] [facility]  [address] [place], [place] [address]   Phone: [phone]  Fax: [phone]    09/17/2007  [address] [place], [place]  [address]  Dear Ms. Sida,   The following are the results of your recent test(s):  Test     Result     Pap Smear    Normal______  Not Normal_____       Comments: _________________________________________________________  _________________________________________________________  _________________________________________________________ _________________________________________________________ _________________________________________________________  Sincerely,  gina , RN [place] at [facility]           Appended Document: Results Follow-up Letter negative

## 2010-08-16 NOTE — Assessment & Plan Note (Signed)
Summary: ? virus//ccm   Vital Signs:  Patient profile:   65 year old female Weight:      135.8 pounds BMI:     22.00 O2 Sat:      98 % Temp:     98.1 degrees F Pulse rate:   67 / minute BP sitting:   120 / 78  (left arm)  Vitals Entered By: Pura Spice, RN (September 19, 2009 10:57 AM) CC: vomiting but thinks resolved diarrhea x 2 this am  nauseous with headache. back surg sch October 12 2009  Is Patient Diabetic? No   History of Present Illness: This 65 year old white married female has been having some vomiting and also epigastric pain associated with nausea. Addition that she had associated headache. The pain in her epigastric and burning along pain with signs of reflux. She has also had 2 episodes of diarrhea today but not at no other time. On February 17 she had an epidural injection and had some nausea and following this He continues to have slight left sciatic pain but is improving since injection  Allergies (verified): No Known Drug Allergies  Past History:  Past Medical History: Last updated: 06/07/2009 Allergic rhinitis Low back pain herniated lumbar disc  Past Surgical History: Last updated: 06/07/2009 Colonoscopy CB x1 Hysterectomy lumbar laminectomy  Social History: Last updated: 03/30/2007 Former Smoker  Risk Factors: Smoking Status: quit > 6 months (06/07/2009)  Review of Systems  The patient denies anorexia, fever, weight loss, weight gain, vision loss, decreased hearing, hoarseness, chest pain, syncope, dyspnea on exertion, peripheral edema, prolonged cough, headaches, hemoptysis, abdominal pain, melena, hematochezia, severe indigestion/heartburn, hematuria, incontinence, genital sores, muscle weakness, suspicious skin lesions, transient blindness, difficulty walking, depression, unusual weight change, abnormal bleeding, enlarged lymph nodes, angioedema, breast masses, and testicular masses.    Physical Exam  General:   Well-developed,well-nourished,in no acute distress; alert,appropriate and cooperative throughout examination Head:  Normocephalic and atraumatic without obvious abnormalities. No apparent alopecia or balding. Eyes:  No corneal or conjunctival inflammation noted. EOMI. Perrla. Funduscopic exam benign, without hemorrhages, exudates or papilledema. Vision grossly normal. Ears:  External ear exam shows no significant lesions or deformities.  Otoscopic examination reveals clear canals, tympanic membranes are intact bilaterally without bulging, retraction, inflammation or discharge. Hearing is grossly normal bilaterally. Nose:  External nasal examination shows no deformity or inflammation. Nasal mucosa are pink and moist without lesions or exudates. Mouth:  Oral mucosa and oropharynx without lesions or exudates.  Teeth in good repair. Lungs:  Normal respiratory effort, chest expands symmetrically. Lungs are clear to auscultation, no crackles or wheezes. Heart:  Normal rate and regular rhythm. S1 and S2 normal without gallop, murmur, click, rub or other extra sounds. Abdomen:  epigastriic tenderness with hyperactive bowel sounds Rectal:  not done Msk:  back exam no limitation straight leg raising Back exam negative   Impression & Recommendations:  Problem # 1:  GASTROENTERITIS, ACUTE (ICD-558.9) Assessment New  Orders: Prescription Created Electronically [phone])  Problem # 2:  HERNIATED LUMBOSACRAL DISC (ICD-722.10) Assessment: Improved  Problem # 3:  DEGENERATIVE JOINT DISEASE, LUMBAR SPINE (ICD-721.90) Assessment: Improved  Problem # 4:  HYPERTENSION, LABILE, HX OF (ICD-V12.50) Assessment: Improved  Complete Medication List: 1)  Baby Aspirin 81 Mg Chew (Aspirin) 2)  Maxzide-25 37.5-25 Mg Tabs (Triamterene-hctz) .... Take 1 once daily 3)  Estrace 1 Mg Tabs (Estradiol) .Marland Kitchen.. 1 once daily 4)  Hydrocodone-acetaminophen 10-650 Mg Tabs (Hydrocodone-acetaminophen) .... 1/2 -1 q4h as needed pain  not to exceed  4 per day 5)  Celebrex 200 Mg Caps (Celecoxib) .Marland Kitchen.. 1 two times a day for arthritis 6)  Klor-con M20 20 Meq Tbcr (Potassium chloride crys cr) .... Take 1 by mouth once daily 7)  Hydroxyzine Hcl 10 Mg Tabs (Hydroxyzine hcl) .Marland Kitchen.. 1 by mouth two times a day 8)  Lotrisone 1-0.05 % Crea (Clotrimazole-betamethasone) .... Apply two times a day for at least 14 days repeaat prn 9)  Lyrica 50 Mg Caps (Pregabalin) .Marland Kitchen.. 1 three times a day pc 10)  Qualquin 325 Mg  .Marland Kitchen.. 1 each day to prevent muscle cramps 11)  Hyoscyamine Sulfate 0.125 Mg Tabs (Hyoscyamine sulfate) .... 2 stat then 1 qid ac hs to prevent diarrhea and irritable bowel 12)  Nexium 40 Mg Cpdr (Esomeprazole magnesium) .Marland Kitchen.. 1 stat repeat tonight then 1 qd  Patient Instructions: 1)  You have gastroenyteritis 2)  No fried greasy spicy foods , low roughage for few days 3)  clear liquids as gatorade today then restart with chicken noodle , potatoe etc , no tomatoe  soup then as nausea and diarrhea subside atart solid food 4)  For mucle cramps start qualquin 1 each day, will prescribe today 5)  return on March 22 or call earlier if not improving Prescriptions: NEXIUM 40 MG CPDR (ESOMEPRAZOLE MAGNESIUM) 1 stat repeat tonight then 1 qd  #30 x 5   Entered and Authorized by:   Judithann Sheen MD   Signed by:   Judithann Sheen MD on 09/19/2009   Method used:   [facility]        [id number]       [address]       [place], [place]  [address]       Ph: [phone] or [phone]       Fax: [phone]   RxID:   [id number] HYOSCYAMINE SULFATE 0.125 MG TABS (HYOSCYAMINE SULFATE) 2 stat then 1 qid ac hs to prevent diarrhea and irritable bowel  #120 x 5   Entered and Authorized by:   Judithann Sheen MD   Signed by:   Judithann Sheen MD on 09/19/2009   Method used:   Print then Give to Patient   RxID:   [id number] QUALQUIN 325 MG 1 each day to prevent muscle cramps  #30 x 5   Entered  and Authorized by:   Judithann Sheen MD   Signed by:   Judithann Sheen MD on 09/19/2009   Method used:   Print then Give to Patient   RxID:   [id number]

## 2010-08-16 NOTE — Assessment & Plan Note (Signed)
 Summary: sciatica nerve//ccm   Vital Signs:  Patient profile:   65 year old female Weight:      139 pounds Temp:     97.9 degrees F oral BP sitting:   142 / 82  Vitals Entered By: Marissa Shropshire CMA (June 27, 2009 8:33 AM) CC: recheck leg and back pain Is Patient Diabetic? No Pain Assessment Patient in pain? yes        History of Present Illness: this 65 year old white female returns complaining of pain in the lower right lumbar spine with pain radiating down the right leg to the foot. Also has numbness and tingling of the right lower leg and foot area has had previous laminectomy but has had recurrence of symptoms and will evaluate and refer back to neurosurgery  Current Medications (verified): 1)  Baby Aspirin  81 Mg  Chew (Aspirin ) 2)  Maxzide-25 37.5-25 Mg  Tabs (Triamterene-Hctz) .... Take 1 Once Daily 3)  Estrace  1 Mg  Tabs (Estradiol ) .SABRA.. 1 Once Daily 4)  Hydrocodone -Acetaminophen  10-650 Mg  Tabs (Hydrocodone -Acetaminophen ) .... 1/2 -1 Q4h As Needed Pain Not To Exceed 4 Per Day 5)  Celebrex 200 Mg  Caps (Celecoxib) .SABRA.. 1 Two Times A Day For Arthritis 6)  Klor-Con  M20 20 Meq  Tbcr (Potassium Chloride  Crys Cr) .... Take 1 By Mouth Once Daily 7)  Hydroxyzine Hcl 10 Mg Tabs (Hydroxyzine Hcl) .SABRA.. 1 By Mouth Two Times A Day 8)  Lotrisone 1-0.05 % Crea (Clotrimazole-Betamethasone) .... Apply Two Times A Day For At Least 14 Days Repeaat Prn 9)  Lyrica 50 Mg Caps (Pregabalin) .SABRA.. 1 Three Times A Day Pc  Allergies (verified): No Known Drug Allergies  Past History:  Past Medical History: Last updated: 06/07/2009 Allergic rhinitis Low back pain herniated lumbar disc  Past Surgical History: Last updated: 06/07/2009 Colonoscopy CB x1 Hysterectomy lumbar laminectomy  Risk Factors: Smoking Status: quit > 6 months (06/07/2009)  Review of Systems  The patient denies anorexia, fever, weight loss, weight gain, vision loss, decreased hearing, hoarseness, chest pain,  syncope, dyspnea on exertion, peripheral edema, prolonged cough, headaches, hemoptysis, abdominal pain, melena, hematochezia, severe indigestion/heartburn, hematuria, incontinence, genital sores, muscle weakness, suspicious skin lesions, transient blindness, difficulty walking, depression, unusual weight change, abnormal bleeding, enlarged lymph nodes, angioedema, breast masses, and testicular masses.    Physical Exam  General:  Well-developed,well-nourished,in no acute distress; alert,appropriate and cooperative throughout examination Lungs:  Normal respiratory effort, chest expands symmetrically. Lungs are clear to auscultation, no crackles or wheezes. Heart:  Normal rate and regular rhythm. S1 and S2 normal without gallop, murmur, click, rub or other extra sounds. Abdomen:  Bowel sounds positive,abdomen soft and non-tender without masses, organomegaly or hernias noted. Rectal:  none exam Msk:  tenderness right lumbosacral spine especially SI joint limitation straight leg raising to 60 right leg, decreased sensation of the foot and lower right leg Pulses:  R and L carotid,radial,femoral,dorsalis pedis and posterior tibial pulses are full and equal bilaterally Extremities:  No clubbing, cyanosis, edema, or deformity noted with normal full range of motion of all joints.     Impression & Recommendations:  Problem # 1:  HERNIATED LUMBOSACRAL DISC (ICD-722.10) Assessment Deteriorated  Orders: Radiology Referral (Radiology)  Problem # 2:  DEGENERATIVE JOINT DISEASE, LUMBAR SPINE (ICD-721.90) Assessment: Deteriorated  Problem # 3:  SCIATICA, ACUTE (ICD-724.3) Assessment: Unchanged  Her updated medication list for this problem includes:    Baby Aspirin  81 Mg Chew (Aspirin )    Hydrocodone -acetaminophen  10-650 Mg Tabs (Hydrocodone -acetaminophen ) .SABRA... 1/2 -1  q4h as needed pain not to exceed 4 per day    Celebrex 200 Mg Caps (Celecoxib) .SABRA... 1 two times a day for arthritis  Problem # 4:   BACK PAIN (ICD-724.5) Assessment: Unchanged  Her updated medication list for this problem includes:    Baby Aspirin  81 Mg Chew (Aspirin )    Hydrocodone -acetaminophen  10-650 Mg Tabs (Hydrocodone -acetaminophen ) .SABRA... 1/2 -1 q4h as needed pain not to exceed 4 per day    Celebrex 200 Mg Caps (Celecoxib) .SABRA... 1 two times a day for arthritis  Problem # 5:  HYPERTENSION, LABILE, HX OF (ICD-V12.50) Assessment: Unchanged  Complete Medication List: 1)  Baby Aspirin  81 Mg Chew (Aspirin ) 2)  Maxzide-25 37.5-25 Mg Tabs (Triamterene-hctz) .... Take 1 once daily 3)  Estrace  1 Mg Tabs (Estradiol ) .SABRA.. 1 once daily 4)  Hydrocodone -acetaminophen  10-650 Mg Tabs (Hydrocodone -acetaminophen ) .... 1/2 -1 q4h as needed pain not to exceed 4 per day 5)  Celebrex 200 Mg Caps (Celecoxib) .SABRA.. 1 two times a day for arthritis 6)  Klor-con  M20 20 Meq Tbcr (Potassium chloride  crys cr) .... Take 1 by mouth once daily 7)  Hydroxyzine Hcl 10 Mg Tabs (Hydroxyzine hcl) .SABRA.. 1 by mouth two times a day 8)  Lotrisone 1-0.05 % Crea (Clotrimazole-betamethasone) .... Apply two times a day for at least 14 days repeaat prn 9)  Lyrica 50 Mg Caps (Pregabalin) .SABRA.. 1 three times a day pc  Patient Instructions: 1)  You have symptoms compatable with herniated disc as prior to previous laminectomy 2)  Prioe to returning to Dr. Alix we will get MRI  LS spine to hasten treatment by Dr. Alix 3)  will call aas soon as scheduled

## 2010-08-16 NOTE — Letter (Signed)
 Summary: [facility]  [facility]   Imported By: Mliss Shutter 10/17/2008 15:39:50  _____________________________________________________________________  External Attachment:    Type:   Image     Comment:   External Document

## 2010-08-16 NOTE — Progress Notes (Signed)
 Summary: Pt has not heard from the surgeons office  Phone Note Call from Patient Call back at Home Phone [phone]   Caller: Patient Call For: [name] Summary of Call: Pt calling to inform Dr [name] that she has not heard from the surgeons' office. Initial call taken by: Inocente Gallon LPN,  April 20, 2008 9:24 AM  Follow-up for Phone Call        dr stafford called Dr Chilton office and awaiting phone call for appt pt called  and informed Follow-up by: Angeline KANDICE Pellet, RN,  April 21, 2008 10:58 AM  Additional Follow-up for Phone Call Additional follow up Details #1::        notified pt and said she went to dr mora office and will be seen thusday oct 15 09 Additional Follow-up by: Angeline KANDICE Pellet, RN,  April 26, 2008 1:37 PM

## 2010-08-16 NOTE — Progress Notes (Signed)
Summary: FYI  Phone Note Call from Patient   Caller: Patient Call For: Judithann Sheen MD Summary of Call: Pt is some better, but believes it is her nerves and will continue what she is doing. [phone] Initial call taken by: Lynann Beaver CMA,  September 21, 2009 10:00 AM  Follow-up for Phone Call        dr stafford aware.  Follow-up by: Pura Spice, RN,  September 21, 2009 11:20 AM

## 2010-08-16 NOTE — Assessment & Plan Note (Signed)
 Summary: flu shot/njr  Nurse Visit   Allergies: No Known Drug Allergies  Orders Added: 1)  Admin 1st Vaccine [90471] 2)  Flu Vaccine 64yrs + [09341] Flu Vaccine Consent Questions     Do you have a history of severe allergic reactions to this vaccine? no    Any prior history of allergic reactions to egg and/or gelatin? no    Do you have a sensitivity to the preservative Thimersol? no    Do you have a past history of Guillan-Barre Syndrome? no    Do you currently have an acute febrile illness? no    Have you ever had a severe reaction to latex? no    Vaccine information given and explained to patient? yes    Are you currently pregnant? no    Lot Number:[id number]   Exp Date:01/11/2010   Site Given  Left Deltoid IM

## 2010-08-16 NOTE — Assessment & Plan Note (Signed)
 Summary: back pain,foot pain/jls   Vital Signs:  Patient Profile:   65 Years Old Female Weight:      132 pounds O2 Sat:      98 % Temp:     98 degrees F Pulse rate:   88 / minute BP sitting:   120 / 80  (left arm)  Vitals Entered By: Angeline KANDICE Pellet, RN (April 13, 2008 2:47 PM)                 Chief Complaint:  sciatiaaic left leg saw dr marla last week was given shot.States feels like pins and needles and numbness left lateral foot.  History of Present Illness: Pt complains of  left leg pain from back radiatiating down left leg, also numbness and pins and needles left lateral foot into toes.  Was seen by Dr. Jannine last week and was given Depomedrol. Symptoms have increased in severity  to point pt can hardly walk and even has pain in bed. Pt iin for evaluation and tx. No known injury. No past hx of this problem.    Current Allergies (reviewed today): No known allergies      Review of Systems      See HPI  General      Denies chills, fatigue, fever, loss of appetite, malaise, sleep disorder, sweats, weakness, and weight loss.  CV      Denies bluish discoloration of lips or nails, chest pain or discomfort, difficulty breathing at night, difficulty breathing while lying down, fainting, fatigue, leg cramps with exertion, lightheadness, near fainting, palpitations, shortness of breath with exertion, swelling of feet, swelling of hands, and weight gain.  GI      Denies abdominal pain, bloody stools, change in bowel habits, constipation, dark tarry stools, diarrhea, excessive appetite, gas, hemorrhoids, indigestion, loss of appetite, nausea, vomiting, vomiting blood, and yellowish skin color.  GU      Denies abnormal vaginal bleeding, decreased libido, discharge, dysuria, genital sores, hematuria, incontinence, nocturia, urinary frequency, and urinary hesitancy.  MS      See HPI   Physical Exam  General:     Well-developed,well-nourished,in no acute distress;  alert,appropriate and cooperative throughout examination Lungs:     Normal respiratory effort, chest expands symmetrically. Lungs are clear to auscultation, no crackles or wheezes. Heart:     Normal rate and regular rhythm. S1 and S2 normal without gallop, murmur, click, rub or other extra sounds. Abdomen:     Bowel sounds positive,abdomen soft and non-tender without masses, organomegaly or hernias noted. Msk:     tender left SI joint and lumbar tenderness Severe pain on Straight leg nraising to 15 degrees hyper reflesic Pulses:     R and L carotid,radial,femoral,dorsalis pedis and posterior tibial pulses are full and equal bilaterally Neurologic:     No cranial nerve deficits noted. Station and gait are normal. Plantar reflexes are down-going bilaterally. DTRs are symmetrical throughout. Sensory, motor and coordinative functions appear intact.    Impression & Recommendations:  Problem # 1:  HERNIATED LUMBOSACRAL DISC (ICD-722.10) Assessment: New  Problem # 2:  SCIATICA, ACUTE (ICD-724.3) Assessment: New  Her updated medication list for this problem includes:    Baby Aspirin  81 Mg Chew (Aspirin )    Hydrocodone -acetaminophen  10-650 Mg Tabs (Hydrocodone -acetaminophen ) .SABRA... 1/2 -1 q4h as needed pain    Celebrex 200 Mg Caps (Celecoxib) .SABRA... 1 two times a day for arthritis   Complete Medication List: 1)  Baby Aspirin  81 Mg Chew (Aspirin ) 2)  Alprazolam   0.25 Mg Tabs (Alprazolam ) .SABRA.. 1 tab three times a day for stress 3)  Maxzide-25 37.5-25 Mg Tabs (Triamterene-hctz) .... Take 1 once daily 4)  Estrace  1 Mg Tabs (Estradiol ) .SABRA.. 1 once daily 5)  Hyoscyamine Sulfate Ir/sr 0.375 Mg Tbcr (Hyoscyamine sulfate) .SABRA.. 1 tab two times a day  fir irritable bowel 6)  Hydrocodone -acetaminophen  10-650 Mg Tabs (Hydrocodone -acetaminophen ) .... 1/2 -1 q4h as needed pain 7)  Celebrex 200 Mg Caps (Celecoxib) .SABRA.. 1 two times a day for arthritis 8)  Klor-con  M20 20 Meq Tbcr (Potassium chloride  crys  cr) .... Take 1 by mouth once daily 9)  Sterapred Ds 12 Day 10 Mg Tabs (Prednisone) .... As directed  Other Orders: Radiology Referral (Radiology)   Patient Instructions: 1)  Pt on bed rest 2)  sterapred dose pk 3)  Lorcet 10-650 1 q4h as needed pain 4)  Scheduled  MRI for noon )ct 2 5)  To refer to Neurosurgeon 6)  My impression is herniated disc L5-S1 7)  Will call as soon as receive results MRI   Prescriptions: STERAPRED DS 12 DAY 10 MG TABS (PREDNISONE) as directed  #1 pkg x 0   Entered and Authorized by:   Marsha JONELLE Viviann Mickey MD   Signed by:   Marsha JONELLE Viviann Mickey MD on 04/19/2008   Method used:   Telephoned to ...       [phone][facility] (retail)       [address]       [place], [place]  [address]       Ph: [phone] or [phone]       Fax: [phone]   RxID:   [id number]  ]

## 2010-08-16 NOTE — Assessment & Plan Note (Signed)
 Summary: Pt concerned about sx with 93/53 standing BP/nn   Vital Signs:  Patient Profile:   65 Years Old Female Weight:      131 pounds Temp:     98.4 degrees F oral Pulse rate:   80 / minute BP sitting:   140 / 90  (left arm) BP standing:   112 / 90  (left arm)  Vitals Entered By: Angeline KANDICE Pellet, RN (April 02, 2007 4:22 PM)                 Chief Complaint:  pt concerned when takes bp at home with her bp machine getting low readings and cncerned.SABRA  History of Present Illness: Pt concerned about low  bp at home with wrist BP manometer ranging from 112 / to 110 sitting and 90/ standing  asymtomatic  BP readings in office with adult cuff 130/ 80 to 140/90 ROS neg in review  no rx needed get new BP manometer  Current Allergies: No known allergies      Review of Systems      See HPI   Physical Exam  General:     Well-developed,well-nourished,in no acute distress; alert,appropriate and cooperative throughout examination Head:     Normocephalic and atraumatic without obvious abnormalities. No apparent alopecia or balding. Eyes:     No corneal or conjunctival inflammation noted. EOMI. Perrla. Funduscopic exam benign, without hemorrhages, exudates or papilledema. Vision grossly normal. Ears:     External ear exam shows no significant lesions or deformities.  Otoscopic examination reveals clear canals, tympanic membranes are intact bilaterally without bulging, retraction, inflammation or discharge. Hearing is grossly normal bilaterally. Nose:     External nasal examination shows no deformity or inflammation. Nasal mucosa are pink and moist without lesions or exudates. Neck:     No deformities, masses, or tenderness noted. Lungs:     Normal respiratory effort, chest expands symmetrically. Lungs are clear to auscultation, no crackles or wheezes. Heart:     Normal rate and regular rhythm. S1 and S2 normal without gallop, murmur, click, rub or other extra  sounds.    Complete Medication List: 1)  Premarin 0.625 Mg Tabs (Estrogens conjugated) 2)  Diclofenac Sodium 75 Mg Tbec (Diclofenac sodium) 3)  Baby Aspirin  81 Mg Chew (Aspirin ) 4)  Alprazolam  0.25 Mg Tabs (Alprazolam ) 5)  Maxzide-25 37.5-25 Mg Tabs (Triamterene-hctz) 6)  Nexium 40 Mg Cpdr (Esomeprazole magnesium)     ]

## 2010-08-16 NOTE — Consult Note (Signed)
 Summary: Dr Alix note  Dr Alix note   Imported By: Doyal Daring 05/14/2008 10:12:54  _____________________________________________________________________  External Attachment:    Type:   Image     Comment:   Dr Alix note

## 2010-08-16 NOTE — Assessment & Plan Note (Signed)
 Summary: flu shot/njr  Nurse Visit    Prior Medications: PREMARIN 0.625 MG  TABS (ESTROGENS CONJUGATED)  DICLOFENAC SODIUM 75 MG  TBEC (DICLOFENAC SODIUM)  BABY ASPIRIN  81 MG  CHEW (ASPIRIN )  ALPRAZOLAM  0.25 MG  TABS (ALPRAZOLAM )  MAXZIDE-25 37.5-25 MG  TABS (TRIAMTERENE-HCTZ)  NEXIUM 40 MG  CPDR (ESOMEPRAZOLE MAGNESIUM)  Current Allergies: No known allergies    Influenza Vaccine    Vaccine Type: Fluvax Non-MCR    Given by: Leeroy Boas, RN  Flu Vaccine Consent Questions    Do you have a history of severe allergic reactions to this vaccine? no    Any prior history of allergic reactions to egg and/or gelatin? no    Do you have a sensitivity to the preservative Thimersol? no    Do you have a past history of Guillan-Barre Syndrome? no    Do you currently have an acute febrile illness? no    Have you ever had a severe reaction to latex? no    Vaccine information given and explained to patient? yes    Are you currently pregnant? no   Impression & Recommendations: lot [id number], exp June 30,2009, [employer], left deltoid IM, 0.5 cc ..................................................................SABRALeeroy Boas, RN  May 20, 2007 2:38 PM   Complete Medication List: 1)  Premarin 0.625 Mg Tabs (Estrogens conjugated) 2)  Diclofenac Sodium 75 Mg Tbec (Diclofenac sodium) 3)  Baby Aspirin  81 Mg Chew (Aspirin ) 4)  Alprazolam  0.25 Mg Tabs (Alprazolam ) 5)  Maxzide-25 37.5-25 Mg Tabs (Triamterene-hctz) 6)  Nexium 40 Mg Cpdr (Esomeprazole magnesium)   Orders Added: 1)  Influenza Vaccine NON MCR [00028]    ]

## 2010-08-16 NOTE — Procedures (Signed)
 Summary: Colonoscopy   Colonoscopy  Procedure date:  01/27/2009  Findings:      Location:  [facility].    Procedures Next Due Date:    Colonoscopy: 02/2019 COLONOSCOPY PROCEDURE REPORT  PATIENT:  [name], [name]  MR#:  [id number] BIRTHDATE:   12-13-1945, 62 yrs. old   GENDER:   female  ENDOSCOPIST:   Alm SAUNDERS. Jakie, MD, [facility] Referred by:   PROCEDURE DATE:  01/27/2009 PROCEDURE:  Colonoscopy, diagnostic ASA CLASS:   Class II INDICATIONS: history of pre-cancerous (adenomatous) colon polyps   MEDICATIONS:    Fentanyl  75 mcg IV, Versed  7 mg IV  DESCRIPTION OF PROCEDURE:   After the risks benefits and alternatives of the procedure were thoroughly explained, informed consent was obtained.  Digital rectal exam was performed and revealed no abnormalities.   The LB [id number] endoscope was introduced through the anus and advanced to the cecum, which was identified by both the appendix and ileocecal valve, without limitations.  The quality of the prep was excellent, using MoviPrep .  The instrument was then slowly withdrawn as the colon was fully examined. <<PROCEDUREIMAGES>>    <<OLD IMAGES>>  FINDINGS:  No polyps or cancers were seen.  This was otherwise a normal examination of the colon.   Retroflexed views in the rectum revealed no abnormalities.    The scope was then withdrawn from the patient and the procedure completed.  COMPLICATIONS:   None  ENDOSCOPIC IMPRESSION:  1) No polyps or cancers  2) Otherwise normal examination RECOMMENDATIONS:  1) You should continue follow current colorectal cancer screening guidelines for routine risk patients with a repeat colonoscopy in 10 years. I do not recommend other colon cancer screening prior to then (including stool tests for microscopic blood) unless new symptoms arise.      REPEAT EXAM:   No   _______________________________ Alm SAUNDERS. Jakie, MD, NOLIA  CC: Elsie Rouse, MD

## 2010-08-16 NOTE — Assessment & Plan Note (Signed)
 Summary: sciatic/mhf   Vital Signs:  Patient Profile:   65 Years Old Female Temp:     99.3 degrees F oral BP sitting:   122 / 82  (left arm) Cuff size:   regular  Vitals Entered By: Dagoberto Jenkins Leak, RN (April 04, 2008 9:35 AM)                 Chief Complaint:  C/o pain tail-bone and back of L leg. Hx sciatica R side.SABRA  History of Present Illness: 65 year old patient presents with a 3-week history of low back pain associate with left leg discomfort.  She has similar problems in the spurring involving the right side ; over the past 3 to 4 days.  She has had worsening pain.  Pain radiates down the posterior aspect of the left leg and may be associated  with some numbness involving the lateral left foot.  Pain is aggravated by walking and movement.  It seems the alleviated by hip flexion.  She states this is able to get countable at night lying supine with her knees pressed against her chest wall.  No constitutional symptoms.  No history of trauma.  She has been using Vicodin without much benefit and has resumed taking Celebrex for the past couple of weeks.  If imaging studies are necessary.  She wishes to wait till after October 1    Current Allergies: No known allergies   Past Medical History:    Allergic rhinitis    Low back pain     Review of Systems       The patient complains of difficulty walking.  The patient denies anorexia, fever, and weight loss.     Physical Exam  General:     alert,  uncomfortable Msk:     there is slight tenderness over the lumbosacral area; the right lumbar musculature was tight and tense, but not tender; the patient was unable to lay supine with her legs and hips extended due to pain; flexion at the hips alleviated the discomfort Neurologic:     patella reflexes were intact with the patient sitting; Achilles reflexes were both blunted; the patient is able to plantar and dorsi flex ankles and toes without difficulty; there may have been  some subjective diminished sensation involving her left lateral foot and toe    Impression & Recommendations:  Problem # 1:  BACK PAIN (ICD-724.5)  Her updated medication list for this problem includes:    Baby Aspirin  81 Mg Chew (Aspirin )    Hydrocodone -acetaminophen  10-650 Mg Tabs (Hydrocodone -acetaminophen ) .SABRA... 1/2 -1 q4h as needed pain    Celebrex 200 Mg Caps (Celecoxib) .SABRA... 1 two times a day for arthritis the patient was given Depo-Medrol  80 mg IM  Problem # 2:  ARTHRITIS (ICD-716.90) Flu Vaccine Consent Questions     Do you have a history of severe allergic reactions to this vaccine? no    Any prior history of allergic reactions to egg and/or gelatin? no    Do you have a sensitivity to the preservative Thimersol? no    Do you have a past history of Guillan-Barre Syndrome? no    Do you currently have an acute febrile illness? no    Have you ever had a severe reaction to latex? no    Vaccine information given and explained to patient? yes    Are you currently pregnant? no    Lot Number:[id number]   Site Given  Left Deltoid IM   Complete Medication List: 1)  Baby Aspirin  81 Mg Chew (Aspirin ) 2)  Alprazolam  0.25 Mg Tabs (Alprazolam ) .SABRA.. 1 tab three times a day for stress 3)  Maxzide-25 37.5-25 Mg Tabs (Triamterene-hctz) .... Take 1 once daily 4)  Estrace  1 Mg Tabs (Estradiol ) .SABRA.. 1 once daily 5)  Hyoscyamine Sulfate Ir/sr 0.375 Mg Tbcr (Hyoscyamine sulfate) .SABRA.. 1 tab two times a day  fir irritable bowel 6)  Hydrocodone -acetaminophen  10-650 Mg Tabs (Hydrocodone -acetaminophen ) .... 1/2 -1 q4h as needed pain 7)  Celebrex 200 Mg Caps (Celecoxib) .SABRA.. 1 two times a day for arthritis 8)  Klor-con  M20 20 Meq Tbcr (Potassium chloride  crys cr) .... Take 1 by mouth once daily  Other Orders: Flu Vaccine 14yrs + (09341) Admin of Therapeutic Inj (IM or [place]) (03627)   Patient Instructions: 1)  Most patients (90%) with low back pain will improve with time (2-6 weeks). Keep active  but avoid activities that are painful. Apply moist heat and/or ice to lower back several times a day. 2)  call if  pain intensifies or  you  develop any leg weakness   ]  Appended Document: sciatic/mhf    Clinical Lists Changes  Orders: Added new Service order of Depo- Medrol  80mg  (J1040) - Signed Added new Service order of Admin of Therapeutic Inj  intramuscular or subcutaneous (03627) - Signed       Medication Administration  Injection # 1:    Medication: Depo- Medrol  80mg     Diagnosis: ARTHRITIS (ICD-716.90)    Route: IM    Site: R deltoid    Exp Date: 11/10    Lot #: [id number]    Mfr: sicor    Patient tolerated injection without complications    Given by: Dagoberto Jenkins Leak, RN (April 04, 2008 10:13 AM)  Orders Added: 1)  Depo- Medrol  80mg  [J1040] 2)  Admin of Therapeutic Inj  intramuscular or subcutaneous [03627]

## 2010-08-16 NOTE — Assessment & Plan Note (Signed)
 Summary: leg pain/jls   Vital Signs:  Patient Profile:   65 Years Old Female Weight:      138 pounds O2 Sat:      97 % Temp:     98.2 degrees F Pulse rate:   78 / minute BP sitting:   120 / 78  (left arm) Cuff size:   regular  Vitals Entered By: Angeline KANDICE Pellet, RN (Nov 26, 2007 10:56 AM)                 Chief Complaint:  rt hip and knee painful .  History of Present Illness: over past month pt has had increased pain inrt hip and rt knee, painful on walking at times at rest needs tx and analgesics IBS doing fine stress under good controll  no other complaints    Current Allergies (reviewed today): No known allergies      Review of Systems      See HPI  MS      pain rt hip and knee   Physical Exam  General:     Well-developed,well-nourished,in no acute distress; alert,appropriate and cooperative throughout examination Lungs:     Normal respiratory effort, chest expands symmetrically. Lungs are clear to auscultation, no crackles or wheezes. Heart:     Normal rate and regular rhythm. S1 and S2 normal without gallop, murmur, click, rub or other extra sounds. Abdomen:     Bowel sounds positive,abdomen soft and non-tender without masses, organomegaly or hernias noted. Msk:     tender rt hip, tender and swollen rt knee Extremities:     No clubbing, cyanosis, edema, or deformity noted with normal full range of motion of all joints.      Impression & Recommendations:  Problem # 1:  ARTHRITIS (ICD-716.90) Assessment: Deteriorated  Problem # 2:  EDEMA (ICD-782.3) Assessment: Improved  Her updated medication list for this problem includes:    Maxzide-25 37.5-25 Mg Tabs (Triamterene-hctz) .SABRA... Take 1 once daily   Problem # 3:  IBS (ICD-564.1) Assessment: Improved  Problem # 4:  HYPERTENSION, LABILE, HX OF (ICD-V12.50) Assessment: Unchanged  Complete Medication List: 1)  Baby Aspirin  81 Mg Chew (Aspirin ) 2)  Alprazolam  0.25 Mg Tabs (Alprazolam ) .SABRA..  1 tab three times a day for stress 3)  Maxzide-25 37.5-25 Mg Tabs (Triamterene-hctz) .... Take 1 once daily 4)  Estrace  1 Mg Tabs (Estradiol ) .SABRA.. 1 once daily 5)  Hyoscyamine Sulfate Ir/sr 0.375 Mg Tbcr (Hyoscyamine sulfate) .SABRA.. 1 tab two times a day  fir irritable bowel 6)  Hydrocodone -acetaminophen  10-650 Mg Tabs (Hydrocodone -acetaminophen ) .... 1/2 -1 q4h as needed pain 7)  Celebrex 200 Mg Caps (Celecoxib) .SABRA.. 1 two times a day for arthritis 8)  Klor-con  M20 20 Meq Tbcr (Potassium chloride  crys cr) .... Take 1 by mouth once daily   Patient Instructions: 1)  to start celebrex 200 mg two times a day 2)  add K+ to diet 3)  hydrocodone  for pain 4)  if increases in severity to Xray   Prescriptions: KLOR-CON  M20 20 MEQ  TBCR (POTASSIUM CHLORIDE  CRYS CR) take 1 by mouth once daily  #30 x 11   Entered by:   Regina G Hudy, RN   Authorized by:   Marsha JONELLE Viviann Mickey MD   Signed by:   Angeline KANDICE Pellet, RN on 12/08/2007   Method used:   Electron[facility] sent to ...       [facility]  [address] [id number]       [address]  [address]       [place], [place]  [address]       Ph: [phone] or [phone]       Fax: [phone]   RxID:   [id number] CELEBREX 200 MG  CAPS (CELECOXIB) 1 two times a day for arthritis  #60 x 11   Entered and Authorized by:   Marsha JONELLE Viviann Mickey MD   Signed by:   Marsha JONELLE Viviann Mickey MD on 11/26/2007   Method used:   Print then Give to Patient   RxID:   [id number] HYDROCODONE -ACETAMINOPHEN  10-650 MG  TABS (HYDROCODONE -ACETAMINOPHEN ) 1/2 -1 q4h as needed pain  #100 x 5   Entered and Authorized by:   Marsha JONELLE Viviann Mickey MD   Signed by:   Marsha JONELLE Viviann Mickey MD on 11/26/2007   Method used:   Print then Give to Patient   RxID:   [id number]  ]

## 2010-08-16 NOTE — Miscellaneous (Signed)
Summary: Orders Update  Clinical Lists Changes  Orders: Added new Referral order of Neurosurgeon Referral (Neurosurgeon) - Signed 

## 2010-08-16 NOTE — Letter (Signed)
Summary: [facility]  [facility]   Imported By: Maryln Gottron 09/20/2009 14:17:53  _____________________________________________________________________  External Attachment:    Type:   Image     Comment:   External Document

## 2010-10-10 ENCOUNTER — Encounter: Payer: Self-pay | Admitting: Family Medicine

## 2010-11-27 NOTE — Op Note (Signed)
[name], [name]               ACCOUNT NO.:  [id number]   MEDICAL RECORD NO.:  [id number]          PATIENT TYPE:  AMB   LOCATION:  SDS                          FACILITY:  [facility]   PHYSICIAN:  [name], M.D.DATE OF BIRTH:  August 09, 1945   DATE OF PROCEDURE:  04/28/2008  DATE OF DISCHARGE:                               OPERATIVE REPORT   PREOPERATIVE DIAGNOSES:  1. Left L5-S1 lumbar disk herniation.  2. Lumbar degenerative disk disease.  3. Lumbar spondylosis.  4. Lumbar radiculopathy.   POSTOPERATIVE DIAGNOSES:  1. Left L5-S1 lumbar disk herniation.  2. Lumbar degenerative disk disease.  3. Lumbar spondylosis.  4. Lumbar radiculopathy.   PROCEDURES:  Left L5-S1 lumbar laminotomy and microdiskectomy with  microdissection.   SURGEON:  [name], M.D.   ASSISTANTS:  1. [name], M.D..   ANESTHESIA:  General endotracheal.   INDICATIONS:  The patient is a 65 year old woman who presented with left  lumbar radiculopathy who was found by MRI scan to have a large left L5-  S1 lumbar disk herniation.  Decision was made to proceed for elective  laminotomy and microdiskectomy.   DESCRIPTION OF PROCEDURE:  The patient was brought to the operating room  and placed under general endotracheal anesthesia.  The patient was  turned to a prone position.  Lumbar region was prepped with Betadine  soap and solution and draped in a sterile fashion.  The midline was  infiltrated with local anesthetic with epinephrine and x-ray was taken.  The L5-S1 level was identified and a midline incision was made over the  L5-S1 level and carried down through the subcutaneous tissue.  Bipolar  cautery and electrocautery maintained hemostasis.  Dissection was  carried down to the lumbar fascia, which was incised on the left side of  the midline and the paraspinal muscles were dissected from the spinous  process and lamina in a subperiosteal fashion.  The L5-S1 intralaminar  space was identified.  X-ray was taken to confirm the localization and  then the microscope was draped and brought to the field to provide  additional navigation, illumination, and visualization.  The remainder  of the decompression was performed using microdissection and  microsurgical technique.  Laminotomy was performed using the X-Max drill  and Kerrison punches.  The ligamentum flavum was carefully removed and  then we identified the thecal sac and exiting left S1 nerve root.  The  structures were retracted medially and the disk herniation was  identified immediately ventral to the left S1 nerve root.  We were able  to incise the remaining annular fibers and the fragment extruded.  We  continued to remove the fragment and then enter into the disk space and  continue with a thorough diskectomy removing all loose fragments of disk  material from the disk space with decompression of thecal sac and nerve  root.  At the end, a thorough diskectomy was performed, all loose  fragments of the disk material were removed from both the disk space and  the epidural space.  Hemostasis was established with the use of bipolar  cautery and Gelfoam soaked in thrombin.  The Gelfoam was removed.  The  wounds were irrigated with Bacitracin solution.  Hemostasis was  confirmed and then we instilled 2 mL of fentanyl and 80 mg of Depo-  Medrol into the epidural space and proceeded with closure.  The deep  fascia was closed with interrupted undyed #1 Vicryl sutures.  The  subcutaneous and subcuticular were closed with interrupted inverted 3-0  undyed Vicryl sutures.  The skin was approximated with Dermabond.  The  procedure was tolerated well.  The estimated blood loss was 10 mL.  Sponge and needle count were correct.  Following the surgery, the  patient was turned back to the supine position, reversed from the  anesthetic, extubated, and transferred to the recovery room for further  care.      [name], M.D.  Electronically Signed     RWN/MEDQ  D:  04/28/2008  T:  04/29/2008  Job:  [id number]

## 2011-04-16 LAB — CBC
HCT: 40.1
Hemoglobin: 13.5
MCHC: 33.8
MCV: 95.6
Platelets: 201
RBC: 4.19
RDW: 12.7
WBC: 6.2

## 2011-06-10 ENCOUNTER — Encounter: Payer: Self-pay | Admitting: Gynecology

## 2011-06-10 ENCOUNTER — Ambulatory Visit (INDEPENDENT_AMBULATORY_CARE_PROVIDER_SITE_OTHER): Payer: Medicare Other | Admitting: Gynecology

## 2011-06-10 VITALS — BP 112/70 | Ht 64.25 in | Wt 142.0 lb

## 2011-06-10 DIAGNOSIS — R609 Edema, unspecified: Secondary | ICD-10-CM | POA: Insufficient documentation

## 2011-06-10 DIAGNOSIS — K644 Residual hemorrhoidal skin tags: Secondary | ICD-10-CM

## 2011-06-10 DIAGNOSIS — N952 Postmenopausal atrophic vaginitis: Secondary | ICD-10-CM

## 2011-06-10 NOTE — Progress Notes (Signed)
[name] 03-Apr-1946 [id number]        65 y.o. new patient for follow up.  Normal he saw Dr. Rickard Patience who has now retired she wants to establish care with another physician from a gynecologic standpoint. She has a history of abnormal Pap smear 30 years ago underwent hysterectomy following a pregnancy and has had normal Pap smears since then.  Past medical history,surgical history, medications, allergies, family history and social history were all reviewed and documented in the EPIC chart. ROS:  Was performed and pertinent positives and negatives are included in the history.  Exam: chaperone present Filed Vitals:   06/10/11 1512  BP: 112/70   General appearance  Normal Skin grossly normal Head/Neck normal with no cervical or supraclavicular adenopathy thyroid normal Lungs  clear Cardiac RR, without RMG Abdominal  soft, nontender, without masses, organomegaly or hernia Breasts  examined lying and sitting without masses, retractions, discharge or axillary adenopathy. Pelvic  Ext/BUS/vagina  normal with atrophic genital changes  Adnexa  Without masses or tenderness    Anus and perineum  normal   Rectovaginal  normal sphincter tone without palpated masses or tenderness. Old external hemorrhoids   Assessment/Plan:  65 y.o. female for follow up.   Exam shows atrophic genital changes. The patient is asymptomatic from this and we'll plan expectant management. Gives history of abnormal Pap smear 30 years ago. Review of her chart shows her last 3 Pap smears were normal, the last being in March 2011. I discussed new screening guidelines she is over 73 although she does have a history of abnormality given that it was 30 years ago. I do not believe we will be able to get pathology from her hysterectomy but we will try.  At this point I have recommended that we continue with Pap smears although at a less frequent interval every 3 years and she is comfortable with this. I did not do a Pap  smear today.  SBE on a monthly basis discussed. Had mammography earlier this year which, after repeat views was normal. We'll continue annual mammography. She did have a bone density also, was told there was some weakness in her wrists but there was no recommended follow up. I am going to get a copy of this. Colonoscopy 2010. No blood work was done today as this is all done through her primary. Assuming she continues well from a gynecologic standpoint she will see me in a year sooner as needed.    Dara Lords MD, 4:02 PM 06/10/2011

## 2012-06-25 ENCOUNTER — Ambulatory Visit (INDEPENDENT_AMBULATORY_CARE_PROVIDER_SITE_OTHER): Payer: Medicare Other | Admitting: Gynecology

## 2012-06-25 ENCOUNTER — Encounter: Payer: Self-pay | Admitting: Gynecology

## 2012-06-25 VITALS — BP 120/72 | Ht 64.25 in | Wt 131.0 lb

## 2012-06-25 DIAGNOSIS — K649 Unspecified hemorrhoids: Secondary | ICD-10-CM

## 2012-06-25 DIAGNOSIS — N952 Postmenopausal atrophic vaginitis: Secondary | ICD-10-CM

## 2012-06-25 NOTE — Progress Notes (Signed)
KHALIAH BARNICK Nov 12, 1945 [id number]        66 y.o.  G1P1 for follow up exam.    Past medical history,surgical history, medications, allergies, family history and social history were all reviewed and documented in the EPIC chart. ROS:  Was performed and pertinent positives and negatives are included in the history.  Exam: Sherrilyn Rist assistant Filed Vitals:   06/25/12 1119  BP: 120/72  Height: 5' 4.25" (1.632 m)  Weight: 131 lb (59.421 kg)   General appearance  Normal Skin grossly normal Head/Neck normal with no cervical or supraclavicular adenopathy thyroid normal Lungs  clear Cardiac RR, without RMG Abdominal  soft, nontender, without masses, organomegaly or hernia Breasts  examined lying and sitting without masses, retractions, discharge or axillary adenopathy. Pelvic  Ext/BUS/vagina  normal with atrophic changes  Adnexa  Without masses or tenderness    Anus and perineum  normal   Rectovaginal  normal sphincter tone without palpated masses or tenderness. Old external hemorrhoids.   Assessment/Plan:  66 y.o. G1P1 female for follow up exam, status post vaginal hysterectomy in the past..   1. Postmenopausal/atrophic genital changes. Patient is asymptomatic and we'll continue to monitor. 2. External hemorrhoids, old. Asymptomatic. We'll continue to monitor. 3. Mammography 2013. We'll continue with annual mammography. SBE monthly reviewed. 4. Pap smear 2011. No Pap smear done today. Current screening guidelines reviewed and the options to stop screening altogether she is status post vaginal hysterectomy with no history of abnormal Pap smears versus less frequent screening discussed. We'll readdress on an annual basis. 5. Bone health. Had DEXA 2 years ago historically. Do not have this report and we'll attempt to retrieve. She is being followed for this by her primary to continue to see him in reference to this. 6. Health maintenance. No blood work done today as it was all done through her  primary physician's office who she sees on a regular basis. Follow up one year, sooner as needed.    Dara Lords MD, 11:54 AM 06/25/2012

## 2012-06-25 NOTE — Patient Instructions (Addendum)
Follow up one year, sooner as needed.

## 2012-06-26 LAB — URINALYSIS W MICROSCOPIC + REFLEX CULTURE
Bilirubin Urine: NEGATIVE
Casts: NONE SEEN
Crystals: NONE SEEN
Glucose, UA: NEGATIVE mg/dL
Hgb urine dipstick: NEGATIVE
Ketones, ur: NEGATIVE mg/dL
Leukocytes, UA: NEGATIVE
Nitrite: NEGATIVE
Protein, ur: NEGATIVE mg/dL
Specific Gravity, Urine: 1.008 (ref 1.005–1.030)
Squamous Epithelial / HPF: NONE SEEN
Urobilinogen, UA: 0.2 mg/dL (ref 0.0–1.0)
pH: 7 (ref 5.0–8.0)

## 2012-06-27 LAB — URINE CULTURE
Colony Count: NO GROWTH
Organism ID, Bacteria: NO GROWTH

## 2013-06-28 ENCOUNTER — Encounter: Payer: Medicare Other | Admitting: Gynecology

## 2013-06-29 ENCOUNTER — Encounter: Payer: Self-pay | Admitting: Gynecology

## 2013-06-29 ENCOUNTER — Other Ambulatory Visit (HOSPITAL_COMMUNITY)
Admission: RE | Admit: 2013-06-29 | Discharge: 2013-06-29 | Disposition: A | Discharge status: Home / Self Care | Payer: Medicare Other | Source: Ambulatory Visit | Attending: Gynecology | Admitting: Gynecology

## 2013-06-29 ENCOUNTER — Ambulatory Visit (INDEPENDENT_AMBULATORY_CARE_PROVIDER_SITE_OTHER): Payer: Medicare Other | Admitting: Gynecology

## 2013-06-29 VITALS — BP 116/74 | Ht 64.0 in | Wt 135.0 lb

## 2013-06-29 DIAGNOSIS — M8580 Other specified disorders of bone density and structure, unspecified site: Secondary | ICD-10-CM

## 2013-06-29 DIAGNOSIS — Z124 Encounter for screening for malignant neoplasm of cervix: Secondary | ICD-10-CM | POA: Insufficient documentation

## 2013-06-29 DIAGNOSIS — N952 Postmenopausal atrophic vaginitis: Secondary | ICD-10-CM

## 2013-06-29 DIAGNOSIS — Z8742 Personal history of other diseases of the female genital tract: Secondary | ICD-10-CM

## 2013-06-29 DIAGNOSIS — M899 Disorder of bone, unspecified: Secondary | ICD-10-CM

## 2013-06-29 DIAGNOSIS — Z79890 Hormone replacement therapy (postmenopausal): Secondary | ICD-10-CM

## 2013-06-29 DIAGNOSIS — Z87898 Personal history of other specified conditions: Secondary | ICD-10-CM

## 2013-06-29 NOTE — Progress Notes (Signed)
[name] July 17, 1945 [id number]        67 y.o.  G1P1 for followup exam.  Several issues noted below.  Past medical history,surgical history, problem list, medications, allergies, family history and social history were all reviewed and documented in the EPIC chart.  ROS:  Performed and pertinent positives and negatives are included in the history, assessment and plan .  Exam: Kim assistant Filed Vitals:   06/29/13 1114  BP: 116/74  Height: 5\' 4"  (1.626 m)  Weight: 135 lb (61.236 kg)   General appearance  Normal Skin grossly normal Head/Neck normal with no cervical or supraclavicular adenopathy thyroid normal Lungs  clear Cardiac RR, without RMG Abdominal  soft, nontender, without masses, organomegaly or hernia Breasts  examined lying and sitting without masses, retractions, discharge or axillary adenopathy. Pelvic  Ext/BUS/vagina  Normal with mild atrophic changes. Pap of cuff done   Adnexa  Without masses or tenderness    Anus and perineum  Normal   Rectovaginal  Normal sphincter tone without palpated masses or tenderness.    Assessment/Plan:  67 y.o. G1P1 female for followup exam.   1. Postmenopausal/ERT, status post TVH for abnormal Pap smear. Patient on Estrace 1 mg daily and has been for a number of years. This was not recognized previously when I saw her. Originally started for hot flashes and sweats.  I reviewed the whole issue of HRT with her to include the WHI study with increased risk of stroke, heart attack, DVT and breast cancer. The ACOG and NAMS statements for lowest dose for the shortest period of time reviewed. Transdermal versus oral first-pass effect benefit discussed. Given her age my recommendation would be to wean from ERT. Asked to take a half a pill for several weeks and then every several days for another several weeks and then discontinue and we'll see how she does. She will call me if she has any issues. 2. Mammography 05/2013. Continue with annual  mammography. SBE monthly reviewed. 3. Pap smear 2011. Pap smear done today. Patient does have history of hysterectomy done for abnormal Pap smear 30 years ago. Final pathology is unknown. Followup Pap smears have all been normal. Will plan on continued screening indefinitely every 3 years given that we do not have the final pathology from the hysterectomy. 4. Osteopenia. Patient recently had DEXA 05/2013 at her primary physician's office. Was told that she had some weakness but not osteoporosis. Was told to take extra vitamin D and calcium. We'll try to get copy of this report but she will followup with them in reference to their recommendations. 5. Colonoscopy 2010. Repeat at their recommended interval. 6. Health maintenance. No blood work done today as it is all done through her primary physician's office. Followup in one year, sooner if any issues.  Note: This document was prepared with digital dictation and possible smart phrase technology. Any transcriptional errors that result from this process are unintentional.   Dara Lords MD, 11:41 AM 06/29/2013

## 2013-06-29 NOTE — Addendum Note (Signed)
Addended by: Dayna Barker on: 06/29/2013 11:53 AM   Modules accepted: Orders

## 2013-06-29 NOTE — Patient Instructions (Signed)
Wean off of the estrogen as we discussed. Call me if you have any issues with this. Followup in one year for annual exam.

## 2013-06-30 LAB — URINALYSIS W MICROSCOPIC + REFLEX CULTURE
Bilirubin Urine: NEGATIVE
Casts: NONE SEEN
Crystals: NONE SEEN
Glucose, UA: NEGATIVE mg/dL
Hgb urine dipstick: NEGATIVE
Ketones, ur: NEGATIVE mg/dL
Leukocytes, UA: NEGATIVE
Nitrite: NEGATIVE
Protein, ur: NEGATIVE mg/dL
Specific Gravity, Urine: 1.017 (ref 1.005–1.030)
Urobilinogen, UA: 0.2 mg/dL (ref 0.0–1.0)
pH: 7.5 (ref 5.0–8.0)

## 2013-07-01 ENCOUNTER — Encounter: Payer: Self-pay | Admitting: Gynecology

## 2013-07-02 ENCOUNTER — Other Ambulatory Visit: Payer: Self-pay | Admitting: Gynecology

## 2013-07-02 MED ORDER — AMPICILLIN 500 MG PO CAPS: 500.0000 mg | ORAL_CAPSULE | Freq: Four times a day (QID) | ORAL | Status: DC

## 2013-07-03 LAB — URINE CULTURE: Colony Count: 55000

## 2013-07-07 ENCOUNTER — Other Ambulatory Visit: Payer: Self-pay | Admitting: Gynecology

## 2013-07-07 MED ORDER — CIPROFLOXACIN HCL 250 MG PO TABS: 250.0000 mg | ORAL_TABLET | Freq: Two times a day (BID) | ORAL | Status: DC

## 2013-11-03 ENCOUNTER — Encounter: Payer: Self-pay | Admitting: *Deleted

## 2013-11-03 NOTE — Telephone Encounter (Signed)
error 

## 2014-01-19 ENCOUNTER — Encounter: Payer: Self-pay | Admitting: Gastroenterology

## 2014-03-10 ENCOUNTER — Ambulatory Visit (AMBULATORY_SURGERY_CENTER): Payer: Self-pay

## 2014-03-10 VITALS — Ht 64.0 in | Wt 138.8 lb

## 2014-03-10 DIAGNOSIS — Z860100 Personal history of colon polyps, unspecified: Secondary | ICD-10-CM

## 2014-03-10 MED ORDER — MOVIPREP 100 G PO SOLR: 1.0000 | Freq: Once | ORAL | Status: DC

## 2014-03-10 NOTE — Progress Notes (Signed)
No allergies to eggs or soy No home oxygen No diet/weight loss meds No past problems with anesthesia  No email 

## 2014-03-29 ENCOUNTER — Ambulatory Visit (AMBULATORY_SURGERY_CENTER): Payer: Commercial Managed Care - HMO | Admitting: Gastroenterology

## 2014-03-29 ENCOUNTER — Encounter: Payer: Self-pay | Admitting: Gastroenterology

## 2014-03-29 VITALS — BP 137/77 | HR 71 | Temp 97.9°F | Resp 19 | Ht 64.0 in | Wt 138.0 lb

## 2014-03-29 DIAGNOSIS — Z860100 Personal history of colon polyps, unspecified: Secondary | ICD-10-CM

## 2014-03-29 DIAGNOSIS — Z1211 Encounter for screening for malignant neoplasm of colon: Secondary | ICD-10-CM | POA: Diagnosis present

## 2014-03-29 DIAGNOSIS — Z800 Family history of malignant neoplasm of digestive organs: Secondary | ICD-10-CM

## 2014-03-29 MED ORDER — SODIUM CHLORIDE 0.9 % IV SOLN: 500.0000 mL | INTRAVENOUS | Status: DC

## 2014-03-29 NOTE — Patient Instructions (Signed)
YOU HAD AN ENDOSCOPIC PROCEDURE TODAY AT THE [facility]: Refer to the procedure report that was given to you for any specific questions about what was found during the examination.  If the procedure report does not answer your questions, please call your gastroenterologist to clarify.  If you requested that your care partner not be given the details of your procedure findings, then the procedure report has been included in a sealed envelope for you to review at your convenience later.  YOU SHOULD EXPECT: Some feelings of bloating in the abdomen. Passage of more gas than usual.  Walking can help get rid of the air that was put into your GI tract during the procedure and reduce the bloating. If you had a lower endoscopy (such as a colonoscopy or flexible sigmoidoscopy) you may notice spotting of blood in your stool or on the toilet paper. If you underwent a bowel prep for your procedure, then you may not have a normal bowel movement for a few days.  DIET: Your first meal following the procedure should be a light meal and then it is ok to progress to your normal diet.  A half-sandwich or bowl of soup is an example of a good first meal.  Heavy or fried foods are harder to digest and may make you feel nauseous or bloated.  Likewise meals heavy in dairy and vegetables can cause extra gas to form and this can also increase the bloating.  Drink plenty of fluids but you should avoid alcoholic beverages for 24 hours.  ACTIVITY: Your care partner should take you home directly after the procedure.  You should plan to take it easy, moving slowly for the rest of the day.  You can resume normal activity the day after the procedure however you should NOT DRIVE or use heavy machinery for 24 hours (because of the sedation medicines used during the test).    SYMPTOMS TO REPORT IMMEDIATELY: A gastroenterologist can be reached at any hour.  During normal business hours, 8:30 AM to 5:00 PM Monday through Friday,  call [phone].  After hours and on weekends, please call the GI answering service at [phone] who will take a message and have the physician on call contact you.   Following lower endoscopy (colonoscopy or flexible sigmoidoscopy):  Excessive amounts of blood in the stool  Significant tenderness or worsening of abdominal pains  Swelling of the abdomen that is new, acute  Fever of 100F or higher  FOLLOW UP: If any biopsies were taken you will be contacted by phone or by letter within the next 1-3 weeks.  Call your gastroenterologist if you have not heard about the biopsies in 3 weeks.  Our staff will call the home number listed on your records the next business day following your procedure to check on you and address any questions or concerns that you may have at that time regarding the information given to you following your procedure. This is a courtesy call and so if there is no answer at the home number and we have not heard from you through the emergency physician on call, we will assume that you have returned to your regular daily activities without incident.  SIGNATURES/CONFIDENTIALITY: You and/or your care partner have signed paperwork which will be entered into your electronic medical record.  These signatures attest to the fact that that the information above on your After Visit Summary has been reviewed and is understood.  Full responsibility of the confidentiality of this   discharge information lies with you and/or your care-partner.  Normal exam  Repeat colonoscopy in 5 years.2020

## 2014-03-29 NOTE — Op Note (Signed)
[facility]  [address] [place], [id number]   COLONOSCOPY PROCEDURE REPORT  PATIENT: [name], [name]  MR#: [id number] BIRTHDATE: [birth year] , 52  yrs. old GENDER: Female ENDOSCOPIST: Milus Banister, MD PROCEDURE DATE:  03/29/2014 PROCEDURE:   Colonoscopy, screening First Screening Colonoscopy - Avg.  risk and is 50 yrs.  old or older - No.  Prior Negative Screening - Now for repeat screening. N/A  History of Adenoma - Now for follow-up colonoscopy & has been > or = to 3 yrs.  N/A  Polyps Removed Today? No.  Recommend repeat exam, <10 yrs? Yes.  High risk (family or personal hx). ASA CLASS:   Class II INDICATIONS:father had colon cancer in his 54s; colonoscopy 2010 Dr. Sharlett Iles was normal. MEDICATIONS: MAC sedation, administered by CRNA and Propofol (Diprivan) 140 mg IV  DESCRIPTION OF PROCEDURE:   After the risks benefits and alternatives of the procedure were thoroughly explained, informed consent was obtained.  A digital rectal exam revealed no abnormalities of the rectum.   The LB [id number]  endoscope was introduced through the anus and advanced to the cecum, which was identified by both the appendix and ileocecal valve. No adverse events experienced.   The quality of the prep was excellent.  The instrument was then slowly withdrawn as the colon was fully examined.   COLON FINDINGS: A normal appearing cecum, ileocecal valve, and appendiceal orifice were identified.  The ascending, hepatic flexure, transverse, splenic flexure, descending, sigmoid colon and rectum appeared unremarkable.  No polyps or cancers were seen. Retroflexed views revealed no abnormalities. The time to cecum=2 minutes 20 seconds.  Withdrawal time=6 minutes 26 seconds.  The scope was withdrawn and the procedure completed. COMPLICATIONS: There were no complications.  ENDOSCOPIC IMPRESSION: Normal colon No polyps or cancers  RECOMMENDATIONS: Given your significant  family history of colon cancer (father), you should have a repeat colonoscopy in 5 years   eSigned:  Milus Banister, MD 03/29/2014 10:50 AM   cc: Shirline Frees, MD

## 2014-03-29 NOTE — Progress Notes (Signed)
A/ox3 pleased with MAC, report to April RN 

## 2014-03-30 ENCOUNTER — Telehealth: Payer: Self-pay | Admitting: *Deleted

## 2014-03-30 NOTE — Telephone Encounter (Signed)
  Follow up Call-  Call back number 03/29/2014  Post procedure Call Back phone  # [phone]  Permission to leave phone message Yes     Patient questions:  Do you have a fever, pain , or abdominal swelling? No. Pain Score  0 *  Have you tolerated food without any problems? Yes.    Have you been able to return to your normal activities? Yes.    Do you have any questions about your discharge instructions: Diet   No. Medications  No. Follow up visit  No.  Do you have questions or concerns about your Care? No.  Actions: * If pain score is 4 or above: No action needed, pain <4.

## 2014-05-16 ENCOUNTER — Encounter: Payer: Self-pay | Admitting: Gastroenterology

## 2014-06-30 ENCOUNTER — Encounter: Payer: Self-pay | Admitting: Gynecology

## 2014-06-30 ENCOUNTER — Ambulatory Visit (INDEPENDENT_AMBULATORY_CARE_PROVIDER_SITE_OTHER): Payer: Commercial Managed Care - HMO | Admitting: Gynecology

## 2014-06-30 VITALS — BP 120/74 | Ht 64.0 in | Wt 136.0 lb

## 2014-06-30 DIAGNOSIS — N951 Menopausal and female climacteric states: Secondary | ICD-10-CM

## 2014-06-30 DIAGNOSIS — M8580 Other specified disorders of bone density and structure, unspecified site: Secondary | ICD-10-CM

## 2014-06-30 DIAGNOSIS — R87619 Unspecified abnormal cytological findings in specimens from cervix uteri: Secondary | ICD-10-CM

## 2014-06-30 DIAGNOSIS — Z79890 Hormone replacement therapy (postmenopausal): Secondary | ICD-10-CM

## 2014-06-30 DIAGNOSIS — N952 Postmenopausal atrophic vaginitis: Secondary | ICD-10-CM

## 2014-06-30 MED ORDER — ESTRADIOL 0.05 MG/24HR TD PTWK: 0.0500 mg | MEDICATED_PATCH | TRANSDERMAL | Status: DC

## 2014-06-30 NOTE — Progress Notes (Signed)
[name] [birth year] [id number]        68 y.o.  G1P1 for follow up exam.  Several issues noted below.  Past medical history,surgical history, problem list, medications, allergies, family history and social history were all reviewed and documented as reviewed in the EPIC chart.  ROS:  Performed with pertinent positives and negatives included in the history, assessment and plan.   Additional significant findings :  none   Exam: Kim Counsellor Vitals:   06/30/14 1058  BP: 120/74  Height: 5\' 4"  (1.626 m)  Weight: 136 lb (61.689 kg)   General appearance:  Normal affect, orientation and appearance. Skin: Grossly normal HEENT: Without gross lesions.  No cervical or supraclavicular adenopathy. Thyroid normal.  Lungs:  Clear without wheezing, rales or rhonchi Cardiac: RR, without RMG Abdominal:  Soft, nontender, without masses, guarding, rebound, organomegaly or hernia Breasts:  Examined lying and sitting without masses, retractions, discharge or axillary adenopathy. Pelvic:  Ext/BUS/vagina with generalized atrophic changes  Adnexa  Without masses or tenderness    Anus and perineum  Normal   Rectovaginal  Normal sphincter tone without palpated masses or tenderness.    Assessment/Plan:  68 y.o. G1P1 female for follow up exam.   1. Postmenopausal/menopausal symptoms/atrophic genital changes.  Status post TVH in the past.  Patient was on Estrace 1 mg daily and wean herself off. Patient is now miserable with hot flushes, night sweats, fatigue and significant vaginal dryness. I reviewed various options with her to include hormonal versus nonhormonal. The patient strongly wants to reinitiate ERT.  I reviewed the whole issue of HRT with her to include the WHI study with increased risk of stroke, heart attack, DVT and breast cancer. The ACOG and NAMS statements for lowest dose for the shortest period of time reviewed. Transdermal versus oral first-pass effect benefit discussed.  After a  lengthy discussion the patient feels it is a quality-of-life issue she wants to go ahead and start on ERT. Will initiate with the patch 0.05 mg weekly and see how she does with this. She'll call me for dosage adjustment or if she does not tolerate the pack we will start oral supplement. 2. Osteopenia.  DEXA 05/2013 T score -1.3  FRAX 14%/1.5%. Patient is followed through her primary physician's office for this and shall continue to follow up with them reference to this. I did review increased calcium vitamin D. Also the beneficial effects of ERT. 3. Pap smear 2014. No Pap smear done today. Patient does have a history of hysterectomy for abnormal smearing number of years ago after the birth of her last child. I do not have results of this and will continue with three-year screening. 4. Mammography 05/2014. Continue with annual mammography. SBE monthly reviewed. 5. Colonoscopy 2015. Repeat at their recommended interval. 6. Health maintenance. No routine lab work done as she reports this done at her primary physician's office.  All up with HRT results, otherwise annually.     Anastasio Auerbach MD, 11:25 AM 06/30/2014

## 2014-06-30 NOTE — Patient Instructions (Signed)
Start on the estrogen patches as we discussed. Call me if you have any issues at all.  You may obtain a copy of any labs that were done today by logging onto MyChart as outlined in the instructions provided with your AVS (after visit summary). The office will not call with normal lab results but certainly if there are any significant abnormalities then we will contact you.   Health Maintenance, Female A healthy lifestyle and preventative care can promote health and wellness.  Maintain regular health, dental, and eye exams.  Eat a healthy diet. Foods like vegetables, fruits, whole grains, low-fat dairy products, and lean protein foods contain the nutrients you need without too many calories. Decrease your intake of foods high in solid fats, added sugars, and salt. Get information about a proper diet from your caregiver, if necessary.  Regular physical exercise is one of the most important things you can do for your health. Most adults should get at least 150 minutes of moderate-intensity exercise (any activity that increases your heart rate and causes you to sweat) each week. In addition, most adults need muscle-strengthening exercises on 2 or more days a week.   Maintain a healthy weight. The body mass index (BMI) is a screening tool to identify possible weight problems. It provides an estimate of body fat based on height and weight. Your caregiver can help determine your BMI, and can help you achieve or maintain a healthy weight. For adults 20 years and older:  A BMI below 18.5 is considered underweight.  A BMI of 18.5 to 24.9 is normal.  A BMI of 25 to 29.9 is considered overweight.  A BMI of 30 and above is considered obese.  Maintain normal blood lipids and cholesterol by exercising and minimizing your intake of saturated fat. Eat a balanced diet with plenty of fruits and vegetables. Blood tests for lipids and cholesterol should begin at age 74 and be repeated every 5 years. If your lipid  or cholesterol levels are high, you are over 50, or you are a high risk for heart disease, you may need your cholesterol levels checked more frequently.Ongoing high lipid and cholesterol levels should be treated with medicines if diet and exercise are not effective.  If you smoke, find out from your caregiver how to quit. If you do not use tobacco, do not start.  Lung cancer screening is recommended for adults aged 55 80 years who are at high risk for developing lung cancer because of a history of smoking. Yearly low-dose computed tomography (CT) is recommended for people who have at least a 30-pack-year history of smoking and are a current smoker or have quit within the past 15 years. A pack year of smoking is smoking an average of 1 pack of cigarettes a day for 1 year (for example: 1 pack a day for 30 years or 2 packs a day for 15 years). Yearly screening should continue until the smoker has stopped smoking for at least 15 years. Yearly screening should also be stopped for people who develop a health problem that would prevent them from having lung cancer treatment.  If you are pregnant, do not drink alcohol. If you are breastfeeding, be very cautious about drinking alcohol. If you are not pregnant and choose to drink alcohol, do not exceed 1 drink per day. One drink is considered to be 12 ounces (355 mL) of beer, 5 ounces (148 mL) of wine, or 1.5 ounces (44 mL) of liquor.  Avoid use of street  drugs. Do not share needles with anyone. Ask for help if you need support or instructions about stopping the use of drugs.  High blood pressure causes heart disease and increases the risk of stroke. Blood pressure should be checked at least every 1 to 2 years. Ongoing high blood pressure should be treated with medicines, if weight loss and exercise are not effective.  If you are 87 to 69 years old, ask your caregiver if you should take aspirin to prevent strokes.  Diabetes screening involves taking a blood  sample to check your fasting blood sugar level. This should be done once every 3 years, after age 5, if you are within normal weight and without risk factors for diabetes. Testing should be considered at a younger age or be carried out more frequently if you are overweight and have at least 1 risk factor for diabetes.  Breast cancer screening is essential preventative care for women. You should practice "breast self-awareness." This means understanding the normal appearance and feel of your breasts and may include breast self-examination. Any changes detected, no matter how small, should be reported to a caregiver. Women in their 58s and 30s should have a clinical breast exam (CBE) by a caregiver as part of a regular health exam every 1 to 3 years. After age 25, women should have a CBE every year. Starting at age 97, women should consider having a mammogram (breast X-ray) every year. Women who have a family history of breast cancer should talk to their caregiver about genetic screening. Women at a high risk of breast cancer should talk to their caregiver about having an MRI and a mammogram every year.  Breast cancer gene (BRCA)-related cancer risk assessment is recommended for women who have family members with BRCA-related cancers. BRCA-related cancers include breast, ovarian, tubal, and peritoneal cancers. Having family members with these cancers may be associated with an increased risk for harmful changes (mutations) in the breast cancer genes BRCA1 and BRCA2. Results of the assessment will determine the need for genetic counseling and BRCA1 and BRCA2 testing.  The Pap test is a screening test for cervical cancer. Women should have a Pap test starting at age 47. Between ages 68 and 29, Pap tests should be repeated every 2 years. Beginning at age 39, you should have a Pap test every 3 years as long as the past 3 Pap tests have been normal. If you had a hysterectomy for a problem that was not cancer or a  condition that could lead to cancer, then you no longer need Pap tests. If you are between ages 25 and 54, and you have had normal Pap tests going back 10 years, you no longer need Pap tests. If you have had past treatment for cervical cancer or a condition that could lead to cancer, you need Pap tests and screening for cancer for at least 20 years after your treatment. If Pap tests have been discontinued, risk factors (such as a new sexual partner) need to be reassessed to determine if screening should be resumed. Some women have medical problems that increase the chance of getting cervical cancer. In these cases, your caregiver may recommend more frequent screening and Pap tests.  The human papillomavirus (HPV) test is an additional test that may be used for cervical cancer screening. The HPV test looks for the virus that can cause the cell changes on the cervix. The cells collected during the Pap test can be tested for HPV. The HPV test could be  used to screen women aged 29 years and older, and should be used in women of any age who have unclear Pap test results. After the age of 52, women should have HPV testing at the same frequency as a Pap test.  Colorectal cancer can be detected and often prevented. Most routine colorectal cancer screening begins at the age of 24 and continues through age 19. However, your caregiver may recommend screening at an earlier age if you have risk factors for colon cancer. On a yearly basis, your caregiver may provide home test kits to check for hidden blood in the stool. Use of a small camera at the end of a tube, to directly examine the colon (sigmoidoscopy or colonoscopy), can detect the earliest forms of colorectal cancer. Talk to your caregiver about this at age 75, when routine screening begins. Direct examination of the colon should be repeated every 5 to 10 years through age 54, unless early forms of pre-cancerous polyps or small growths are found.  Hepatitis C blood  testing is recommended for all people born from [birth year] through 1966 and any individual with known risks for hepatitis C.  Practice safe sex. Use condoms and avoid high-risk sexual practices to reduce the spread of sexually transmitted infections (STIs). Sexually active women aged 80 and younger should be checked for Chlamydia, which is a common sexually transmitted infection. Older women with new or multiple partners should also be tested for Chlamydia. Testing for other STIs is recommended if you are sexually active and at increased risk.  Osteoporosis is a disease in which the bones lose minerals and strength with aging. This can result in serious bone fractures. The risk of osteoporosis can be identified using a bone density scan. Women ages 59 and over and women at risk for fractures or osteoporosis should discuss screening with their caregivers. Ask your caregiver whether you should be taking a calcium supplement or vitamin D to reduce the rate of osteoporosis.  Menopause can be associated with physical symptoms and risks. Hormone replacement therapy is available to decrease symptoms and risks. You should talk to your caregiver about whether hormone replacement therapy is right for you.  Use sunscreen. Apply sunscreen liberally and repeatedly throughout the day. You should seek shade when your shadow is shorter than you. Protect yourself by wearing long sleeves, pants, a wide-brimmed hat, and sunglasses year round, whenever you are outdoors.  Notify your caregiver of new moles or changes in moles, especially if there is a change in shape or color. Also notify your caregiver if a mole is larger than the size of a pencil eraser.  Stay current with your immunizations. Document Released: 01/14/2011 Document Revised: 10/26/2012 Document Reviewed: 01/14/2011 [facility] Patient Information 2014 [place].

## 2014-07-06 ENCOUNTER — Telehealth: Payer: Self-pay | Admitting: *Deleted

## 2014-07-06 NOTE — Telephone Encounter (Signed)
Prior authorization for estradiol done online, will wait for response.

## 2014-07-11 NOTE — Telephone Encounter (Signed)
Medication approved until 07/15/2015

## 2014-07-21 ENCOUNTER — Telehealth: Payer: Self-pay | Admitting: *Deleted

## 2014-07-21 MED ORDER — ESTRADIOL 1 MG PO TABS: 1.0000 mg | ORAL_TABLET | Freq: Every day | ORAL | Status: DC

## 2014-07-21 NOTE — Telephone Encounter (Signed)
Pt called c/o climara 0.05mg  patch was too expensive at $125 so pt never started patch. Asked if you could send Rx in for estradiol 1 mg tablet, had some left over and currently using them now. Please advise

## 2014-07-21 NOTE — Telephone Encounter (Signed)
Okay for the Estrace. I would try to see if she could do was just half a tablet or 0.5 mg. But we can refill Estrace 1 mg #30 with 11 refills

## 2014-07-21 NOTE — Telephone Encounter (Signed)
Pt informed will try cutting in half, rx sent

## 2015-01-23 ENCOUNTER — Encounter: Payer: Self-pay | Admitting: Internal Medicine

## 2015-01-23 ENCOUNTER — Encounter: Payer: Self-pay | Admitting: Gastroenterology

## 2015-06-15 DIAGNOSIS — M8580 Other specified disorders of bone density and structure, unspecified site: Secondary | ICD-10-CM

## 2015-06-15 HISTORY — DX: Other specified disorders of bone density and structure, unspecified site: M85.80

## 2015-07-04 ENCOUNTER — Encounter: Payer: Commercial Managed Care - HMO | Admitting: Gynecology

## 2015-07-05 ENCOUNTER — Encounter: Payer: Self-pay | Admitting: Gynecology

## 2015-07-06 ENCOUNTER — Encounter: Payer: Self-pay | Admitting: Gynecology

## 2015-07-06 ENCOUNTER — Ambulatory Visit (INDEPENDENT_AMBULATORY_CARE_PROVIDER_SITE_OTHER): Payer: Medicare Other | Admitting: Gynecology

## 2015-07-06 VITALS — BP 120/76 | Ht 64.0 in | Wt 137.0 lb

## 2015-07-06 DIAGNOSIS — M8580 Other specified disorders of bone density and structure, unspecified site: Secondary | ICD-10-CM

## 2015-07-06 DIAGNOSIS — Z01419 Encounter for gynecological examination (general) (routine) without abnormal findings: Secondary | ICD-10-CM

## 2015-07-06 DIAGNOSIS — Z79890 Hormone replacement therapy (postmenopausal): Secondary | ICD-10-CM | POA: Diagnosis not present

## 2015-07-06 DIAGNOSIS — N952 Postmenopausal atrophic vaginitis: Secondary | ICD-10-CM | POA: Diagnosis not present

## 2015-07-06 MED ORDER — ESTRADIOL 1 MG PO TABS: 1.0000 mg | ORAL_TABLET | Freq: Every day | ORAL | Status: DC

## 2015-07-06 NOTE — Progress Notes (Signed)
[name] April 30, 1946 [id number]        69 y.o.  G1P1  for breast and pelvic exam  Past medical history,surgical history, problem list, medications, allergies, family history and social history were all reviewed and documented as reviewed in the EPIC chart.  ROS:  Performed with pertinent positives and negatives included in the history, assessment and plan.   Additional significant findings :  none   Exam: Kim Counsellor Vitals:   07/06/15 1105  BP: 120/76  Height: 5\' 4"  (1.626 m)  Weight: 137 lb (62.143 kg)   General appearance:  Normal affect, orientation and appearance. Skin: Grossly normal HEENT: Without gross lesions.  No cervical or supraclavicular adenopathy. Thyroid normal.  Lungs:  Clear without wheezing, rales or rhonchi Cardiac: RR, without RMG Abdominal:  Soft, nontender, without masses, guarding, rebound, organomegaly or hernia Breasts:  Examined lying and sitting without masses, retractions, discharge or axillary adenopathy. Pelvic:  Ext/BUS/vagina with atrophic changes  Adnexa  Without masses or tenderness    Anus and perineum  Normal   Rectovaginal  Normal sphincter tone without palpated masses or tenderness.    Assessment/Plan:  69 y.o. G1P1 female for breast and pelvic exam.   1. Postmenopausal/atrophic genital changes/HRT. Status post vaginal hysterectomy in the past on Estrace 1 mg daily. Has tried stopping with unacceptable hot flushes and night sweats. Discussed this last year and was going to place her on a patch to help minimize thrombosis risk but she said it was too expensive and preferred the Estrace. I again reviewed the whole issue of HRT and risks to include stroke heart attack DVT possible breast cancer. Unusual to be continuing in the 70s. Patient very strongly feels that his quality of life issue and she is miserable without it. I did recommend trying 0.5 mg to see how she does with this. She is going to snap her tablets in half and see if  she tolerates the symptoms. If not she'll continue on the 1 mg at her choice.  Not having significant vaginal dryness or other issues. 2. Osteopenia. Patient had DEXA 2 days ago at [facility] and we are awaiting the report.  T score 2014 -1.3. 3. Mammography 06/2015. Continue with annual mammography when due. SBE monthly reviewed. 4. Colonoscopy 2015. Repeat at their recommended interval. 5. Pap smear 2014. No Pap smear done today. No history of significant abnormal Pap smears. We discussed stop screening options per current screening guidelines based on age and hysterectomy history. Will readdress on an annual basis. 6. Health maintenance. No routine lab work done as this is done at her primary physician's office. Follow up 1 year, sooner as needed.   Anastasio Auerbach MD, 11:29 AM 07/06/2015

## 2015-07-06 NOTE — Patient Instructions (Signed)

## 2015-07-11 ENCOUNTER — Encounter: Payer: Self-pay | Admitting: Gynecology

## 2015-07-11 ENCOUNTER — Telehealth: Payer: Self-pay | Admitting: Gynecology

## 2015-07-11 NOTE — Telephone Encounter (Signed)
Tell patient that her bone density shows some loss but still not in the treatable range. I would make sure she has a recent vitamin D level checked either here or at her primary physician's office to make sure she is within the normal range.

## 2015-07-12 ENCOUNTER — Ambulatory Visit: Payer: Self-pay | Admitting: Orthopedic Surgery

## 2015-07-12 NOTE — Progress Notes (Signed)
Preoperative surgical orders have been place into the Epic hospital system for [name] on 07/12/2015, 3:13 PM  by Mickel Crow for surgery on 08-02-15.  Preop Total Knee orders including Experal, IV Tylenol, and IV Decadron as long as there are no contraindications to the above medications. Arlee Muslim, PA-C

## 2015-07-12 NOTE — Telephone Encounter (Signed)
Pt informed with the below note, will have PCP check Vitamin d level and fax here.

## 2015-07-12 NOTE — Telephone Encounter (Signed)
Left message for pt to call.

## 2015-07-25 ENCOUNTER — Encounter (HOSPITAL_COMMUNITY)
Admission: RE | Admit: 2015-07-25 | Discharge: 2015-07-25 | Disposition: A | Discharge status: Home / Self Care | Payer: PPO | Source: Ambulatory Visit | Attending: Orthopedic Surgery | Admitting: Orthopedic Surgery

## 2015-07-25 ENCOUNTER — Encounter (HOSPITAL_COMMUNITY): Payer: Self-pay

## 2015-07-25 DIAGNOSIS — Z01812 Encounter for preprocedural laboratory examination: Secondary | ICD-10-CM | POA: Insufficient documentation

## 2015-07-25 DIAGNOSIS — Z0183 Encounter for blood typing: Secondary | ICD-10-CM | POA: Diagnosis not present

## 2015-07-25 DIAGNOSIS — Z01818 Encounter for other preprocedural examination: Secondary | ICD-10-CM | POA: Diagnosis present

## 2015-07-25 DIAGNOSIS — R9431 Abnormal electrocardiogram [ECG] [EKG]: Secondary | ICD-10-CM | POA: Diagnosis not present

## 2015-07-25 DIAGNOSIS — M1712 Unilateral primary osteoarthritis, left knee: Secondary | ICD-10-CM | POA: Insufficient documentation

## 2015-07-25 HISTORY — DX: Syncope and collapse: R55

## 2015-07-25 HISTORY — DX: Hesitancy of micturition: R39.11

## 2015-07-25 HISTORY — DX: Anesthesia of skin: R20.0

## 2015-07-25 HISTORY — DX: Unspecified osteoarthritis, unspecified site: M19.90

## 2015-07-25 HISTORY — DX: Frequency of micturition: R35.0

## 2015-07-25 HISTORY — DX: Legal blindness, as defined in USA: H54.8

## 2015-07-25 HISTORY — DX: Anesthesia of skin: R20.2

## 2015-07-25 HISTORY — DX: Unspecified cataract: H26.9

## 2015-07-25 HISTORY — DX: Presence of spectacles and contact lenses: Z97.3

## 2015-07-25 HISTORY — DX: Gastro-esophageal reflux disease without esophagitis: K21.9

## 2015-07-25 LAB — CBC
HCT: 42.5 % (ref 36.0–46.0)
Hemoglobin: 13.9 g/dL (ref 12.0–15.0)
MCH: 31.9 pg (ref 26.0–34.0)
MCHC: 32.7 g/dL (ref 30.0–36.0)
MCV: 97.5 fL (ref 78.0–100.0)
Platelets: 252 10*3/uL (ref 150–400)
RBC: 4.36 MIL/uL (ref 3.87–5.11)
RDW: 12.6 % (ref 11.5–15.5)
WBC: 4.9 10*3/uL (ref 4.0–10.5)

## 2015-07-25 LAB — COMPREHENSIVE METABOLIC PANEL WITH GFR
ALT: 20 U/L (ref 14–54)
AST: 25 U/L (ref 15–41)
Albumin: 4.1 g/dL (ref 3.5–5.0)
Alkaline Phosphatase: 81 U/L (ref 38–126)
Anion gap: 7 (ref 5–15)
BUN: 16 mg/dL (ref 6–20)
CO2: 27 mmol/L (ref 22–32)
Calcium: 8.9 mg/dL (ref 8.9–10.3)
Chloride: 105 mmol/L (ref 101–111)
Creatinine, Ser: 0.66 mg/dL (ref 0.44–1.00)
GFR calc Af Amer: >60 mL/min
GFR calc non Af Amer: >60 mL/min
Glucose, Bld: 100 mg/dL — ABNORMAL HIGH (ref 65–99)
Potassium: 4.2 mmol/L (ref 3.5–5.1)
Sodium: 139 mmol/L (ref 135–145)
Total Bilirubin: 1 mg/dL (ref 0.3–1.2)
Total Protein: 7.3 g/dL (ref 6.5–8.1)

## 2015-07-25 LAB — URINALYSIS, ROUTINE W REFLEX MICROSCOPIC
Bilirubin Urine: NEGATIVE
Glucose, UA: NEGATIVE mg/dL
Hgb urine dipstick: NEGATIVE
Ketones, ur: NEGATIVE mg/dL
Leukocytes, UA: NEGATIVE
Nitrite: NEGATIVE
Protein, ur: NEGATIVE mg/dL
Specific Gravity, Urine: 1.003 — ABNORMAL LOW (ref 1.005–1.030)
pH: 6.5 (ref 5.0–8.0)

## 2015-07-25 LAB — PROTIME-INR
INR: 0.97 (ref 0.00–1.49)
Prothrombin Time: 13.1 s (ref 11.6–15.2)

## 2015-07-25 LAB — SURGICAL PCR SCREEN
MRSA, PCR: NEGATIVE
Staphylococcus aureus: NEGATIVE

## 2015-07-25 LAB — APTT: aPTT: 28 s (ref 24–37)

## 2015-07-25 LAB — ABO/RH: ABO/RH(D): O POS

## 2015-07-25 NOTE — Progress Notes (Signed)
Clearance note per chart per Dr Kenton Kingfisher 04/10/2015

## 2015-07-25 NOTE — Patient Instructions (Signed)
RENN SITLER  07/25/2015   Your procedure is scheduled on: Wednesday August 02, 2015   Report to [facility] Main  Entrance take [place]  elevators to 3rd floor to  [place] at 10:30 AM.  Call this number if you have problems the morning of surgery [phone]   Remember: ONLY 1 PERSON MAY GO WITH YOU TO SHORT STAY TO GET  READY MORNING OF [place].  Do not eat food or drink liquids :After Midnight.     Take these medicines the morning of surgery with A SIP OF WATER: Hydrocodone-Acetaminophen if needed:                               You may not have any metal on your body including hair pins and              piercings  Do not wear jewelry, make-up, lotions, powders or perfumes, deodorant             Do not wear nail polish.  Do not shave  48 hours prior to surgery.                Do not bring valuables to the hospital. [place].  Contacts, dentures or bridgework may not be worn into surgery.  Leave suitcase in the car. After surgery it may be brought to your room.                Please read over the following fact sheets you were given:MRSA INFORMATION SHEET; INCENTIVE SPIROMETER; BLOOD TRANSFUSION INFORMATION SHEET  _____________________________________________________________________             [facility] - Preparing for Surgery Before surgery, you can play an important role.  Because skin is not sterile, your skin needs to be as free of germs as possible.  You can reduce the number of germs on your skin by washing with CHG (chlorahexidine gluconate) soap before surgery.  CHG is an antiseptic cleaner which kills germs and bonds with the skin to continue killing germs even after washing. Please DO NOT use if you have an allergy to CHG or antibacterial soaps.  If your skin becomes reddened/irritated stop using the CHG and inform your nurse when you arrive at Short Stay. Do not shave (including  legs and underarms) for at least 48 hours prior to the first CHG shower.  You may shave your face/neck. Please follow these instructions carefully:  1.  Shower with CHG Soap the night before surgery and the  morning of Surgery.  2.  If you choose to wash your hair, wash your hair first as usual with your  normal  shampoo.  3.  After you shampoo, rinse your hair and body thoroughly to remove the  shampoo.                           4.  Use CHG as you would any other liquid soap.  You can apply chg directly  to the skin and wash                       Gently with a scrungie or clean washcloth.  5.  Apply the CHG Soap to your body ONLY FROM THE NECK DOWN.   Do not use on face/ open                           Wound or open sores. Avoid contact with eyes, ears mouth and genitals (private parts).                       Wash face,  Genitals (private parts) with your normal soap.             6.  Wash thoroughly, paying special attention to the area where your surgery  will be performed.  7.  Thoroughly rinse your body with warm water from the neck down.  8.  DO NOT shower/wash with your normal soap after using and rinsing off  the CHG Soap.                9.  Pat yourself dry with a clean towel.            10.  Wear clean pajamas.            11.  Place clean sheets on your bed the night of your first shower and do not  sleep with pets. Day of Surgery : Do not apply any lotions/deodorants the morning of surgery.  Please wear clean clothes to the hospital/surgery center.  FAILURE TO FOLLOW THESE INSTRUCTIONS MAY RESULT IN THE CANCELLATION OF YOUR SURGERY PATIENT SIGNATURE_________________________________  NURSE SIGNATURE__________________________________  ________________________________________________________________________   Incentive Spirometer  An incentive spirometer is a tool that can help keep your lungs clear and active. This tool measures how well you are filling your lungs with each breath.  Taking long deep breaths may help reverse or decrease the chance of developing breathing (pulmonary) problems (especially infection) following:  A long period of time when you are unable to move or be active. BEFORE THE PROCEDURE   If the spirometer includes an indicator to show your best effort, your nurse or respiratory therapist will set it to a desired goal.  If possible, sit up straight or lean slightly forward. Try not to slouch.  Hold the incentive spirometer in an upright position. INSTRUCTIONS FOR USE   Sit on the edge of your bed if possible, or sit up as far as you can in bed or on a chair.  Hold the incentive spirometer in an upright position.  Breathe out normally.  Place the mouthpiece in your mouth and seal your lips tightly around it.  Breathe in slowly and as deeply as possible, raising the piston or the ball toward the top of the column.  Hold your breath for 3-5 seconds or for as long as possible. Allow the piston or ball to fall to the bottom of the column.  Remove the mouthpiece from your mouth and breathe out normally.  Rest for a few seconds and repeat Steps 1 through 7 at least 10 times every 1-2 hours when you are awake. Take your time and take a few normal breaths between deep breaths.  The spirometer may include an indicator to show your best effort. Use the indicator as a goal to work toward during each repetition.  After each set of 10 deep breaths, practice coughing to be sure your lungs are clear. If you have an incision (the cut made at the time of surgery), support your incision when coughing by placing a   pillow or rolled up towels firmly against it. Once you are able to get out of bed, walk around indoors and cough well. You may stop using the incentive spirometer when instructed by your caregiver.  RISKS AND COMPLICATIONS  Take your time so you do not get dizzy or light-headed.  If you are in pain, you may need to take or ask for pain medication  before doing incentive spirometry. It is harder to take a deep breath if you are having pain. AFTER USE  Rest and breathe slowly and easily.  It can be helpful to keep track of a log of your progress. Your caregiver can provide you with a simple table to help with this. If you are using the spirometer at home, follow these instructions: SEEK MEDICAL CARE IF:   You are having difficultly using the spirometer.  You have trouble using the spirometer as often as instructed.  Your pain medication is not giving enough relief while using the spirometer.  You develop fever of 100.5 F (38.1 C) or higher. SEEK IMMEDIATE MEDICAL CARE IF:   You cough up bloody sputum that had not been present before.  You develop fever of 102 F (38.9 C) or greater.  You develop worsening pain at or near the incision site. MAKE SURE YOU:   Understand these instructions.  Will watch your condition.  Will get help right away if you are not doing well or get worse. Document Released: 11/11/2006 Document Revised: 09/23/2011 Document Reviewed: 01/12/2007 [employer] Patient Information 2014 [employer].   ________________________________________________________________________  WHAT IS A BLOOD TRANSFUSION? Blood Transfusion Information  A transfusion is the replacement of blood or some of its parts. Blood is made up of multiple cells which provide different functions.  Red blood cells carry oxygen and are used for blood loss replacement.  White blood cells fight against infection.  Platelets control bleeding.  Plasma helps clot blood.  Other blood products are available for specialized needs, such as hemophilia or other clotting disorders. BEFORE THE TRANSFUSION  Who gives blood for transfusions?   Healthy volunteers who are fully evaluated to make sure their blood is safe. This is blood bank blood. Transfusion therapy is the safest it has ever been in the practice of medicine. Before blood is  taken from a donor, a complete history is taken to make sure that person has no history of diseases nor engages in risky social behavior (examples are intravenous drug use or sexual activity with multiple partners). The donor's travel history is screened to minimize risk of transmitting infections, such as malaria. The donated blood is tested for signs of infectious diseases, such as HIV and hepatitis. The blood is then tested to be sure it is compatible with you in order to minimize the chance of a transfusion reaction. If you or a relative donates blood, this is often done in anticipation of surgery and is not appropriate for emergency situations. It takes many days to process the donated blood. RISKS AND COMPLICATIONS Although transfusion therapy is very safe and saves many lives, the main dangers of transfusion include:   Getting an infectious disease.  Developing a transfusion reaction. This is an allergic reaction to something in the blood you were given. Every precaution is taken to prevent this. The decision to have a blood transfusion has been considered carefully by your caregiver before blood is given. Blood is not given unless the benefits outweigh the risks. AFTER THE TRANSFUSION  Right after receiving a blood transfusion, you   will usually feel much better and more energetic. This is especially true if your red blood cells have gotten low (anemic). The transfusion raises the level of the red blood cells which carry oxygen, and this usually causes an energy increase.  The nurse administering the transfusion will monitor you carefully for complications. HOME CARE INSTRUCTIONS  No special instructions are needed after a transfusion. You may find your energy is better. Speak with your caregiver about any limitations on activity for underlying diseases you may have. SEEK MEDICAL CARE IF:   Your condition is not improving after your transfusion.  You develop redness or irritation at the  intravenous (IV) site. SEEK IMMEDIATE MEDICAL CARE IF:  Any of the following symptoms occur over the next 12 hours:  Shaking chills.  You have a temperature by mouth above 102 F (38.9 C), not controlled by medicine.  Chest, back, or muscle pain.  People around you feel you are not acting correctly or are confused.  Shortness of breath or difficulty breathing.  Dizziness and fainting.  You get a rash or develop hives.  You have a decrease in urine output.  Your urine turns a dark color or changes to pink, red, or brown. Any of the following symptoms occur over the next 10 days:  You have a temperature by mouth above 102 F (38.9 C), not controlled by medicine.  Shortness of breath.  Weakness after normal activity.  The white part of the eye turns yellow (jaundice).  You have a decrease in the amount of urine or are urinating less often.  Your urine turns a dark color or changes to pink, red, or brown. Document Released: 06/28/2000 Document Revised: 09/23/2011 Document Reviewed: 02/15/2008 [facility] Patient Information 2014 [place], [place].  _______________________________________________________________________

## 2015-07-30 ENCOUNTER — Ambulatory Visit: Payer: Self-pay | Admitting: Orthopedic Surgery

## 2015-07-30 NOTE — H&P (Signed)
[name] DOB: 1945-12-27 Married / Language: English / Race: White Female Date of Admission:  08/02/2015 CC:  Left Knee Pain History of Present Illness The patient is a 70 year old female who comes in for a preoperative History and Physical. The patient is scheduled for a left total knee arthroplasty to be performed by Dr. Dione Plover. Aluisio, MD at [facility] on 08-02-2015. The patient is a 70 year old female who has been followed for their left knee. The patient is being followed for their left osteoarthritis. They are now out from Synvisc injections. Symptoms reported include: pain (medial pain on and off). The patient has had some benefit from the gel series but only shortlived. Current treatment includes: pain medications. The following medication has been used for pain control: Hydrocodone and Tylenol. She says she got temporary benefit but did not last very long. She does occasionally have some discomfort in her knee. She would, however, like to go ahead and proceed with knee replacement. She feels like she is at a point where she would like a more permanent solution as opposed to these temporary solutions with a viscosupplementation. They have been treated conservatively in the past for the above stated problem and despite conservative measures, they continue to have progressive pain and severe functional limitations and dysfunction. They have failed non-operative management including home exercise, medications, and injections. It is felt that they would benefit from undergoing total joint replacement. Risks and benefits of the procedure have been discussed with the patient and they elect to proceed with surgery. There are no active contraindications to surgery such as ongoing infection or rapidly progressive neurological disease.  Problem List/Past Medical  Primary osteoarthritis of left knee (M17.12)  Chronic sciatica of right side (M54.31)  Chronic Sciatica of Left Side   Cataract  Bilateral Impaired Vision  Left Eye Blindness Ruptured Disc at L4  Hyperlipidemia  Anxiety Disorder  Insomnia  Gastroesophageal Reflux Disease  Palpitations   Allergies No Known Drug Allergies  Family History Hypertension  brother and grandmother mothers side Osteoporosis  mother Rheumatoid Arthritis  mother Heart Disease  grandmother fathers side Cancer  father Cerebrovascular Accident  mother, grandmother mothers side and grandfather mothers side Diabetes Mellitus  father and brother  Social History Number of flights of stairs before winded  1 Marital status  married Living situation  live with spouse Tobacco use  former smoker; smoke(d) less than 1/2 pack(s) per day Tobacco / smoke exposure  no Pain Contract  no Illicit drug use  no Current work status  retired Children  1 Alcohol use  never consumed alcohol Exercise  Exercises never Drug/Alcohol Rehab (Previously)  no Drug/Alcohol Rehab (Currently)  no Post-Surgical Plans  Home  Medication History Potassium Chloride (20MEQ Tablet ER, Oral) Active. ALPRAZolam (0.25MG  Tablet, Oral) Active. Temazepam (15MG  Capsule, Oral) Active. Estradiol (1MG  Tablet, Oral) Active. Stool Softner Active. Aspirin Adult Low Strength (81MG  Tablet Chewable, Oral daily) Active. Complete Senior (Oral daily) Active. Fish Oil Concentrate (435MG  Capsule, 1 (one) Oral) Active. Red Yeast Rice (600MG  Capsule, 1 (one) Oral daily) Active. Chewable Calcium (Oral) Specific strength unknown - Active. Glucosamine Triple Strength Active. Pantoprazole Sodium (40MG  Tablet DR, Oral as needed) Active. Acetaminophen (500MG  Capsule, Oral as needed) Active. Cetirizine HCl (10MG  Tablet Chewable, Oral as needed) Active. Glycerin (Adult) (2.1GM Suppository, Rectal as needed) Active.   Past Surgical History Spinal Surgery  Date: 04/2008. Hysterectomy  Date: 08/1979. partial (cancerous) Colon Polyp  Removal - Colonoscopy   Review of  Systems General Not Present- Chills, Fatigue, Fever, Memory Loss, Night Sweats, Weight Gain and Weight Loss. Skin Not Present- Eczema, Hives, Itching, Lesions and Rash. HEENT Not Present- Dentures, Double Vision, Headache, Hearing Loss, Tinnitus and Visual Loss. Respiratory Not Present- Allergies, Chronic Cough, Coughing up blood, Shortness of breath at rest and Shortness of breath with exertion. Cardiovascular Not Present- Chest Pain, Difficulty Breathing Lying Down, Murmur, Palpitations, Racing/skipping heartbeats and Swelling. Gastrointestinal Not Present- Abdominal Pain, Bloody Stool, Constipation, Diarrhea, Difficulty Swallowing, Heartburn, Jaundice, Loss of appetitie, Nausea and Vomiting. Female Genitourinary Not Present- Blood in Urine, Discharge, Flank Pain, Incontinence, Painful Urination, Urgency, Urinary frequency, Urinary Retention, Urinating at Night and Weak urinary stream. Musculoskeletal Present- Back Pain (Ruptured Disc L4) and Joint Pain. Not Present- Joint Swelling, Morning Stiffness, Muscle Pain, Muscle Weakness and Spasms. Neurological Not Present- Blackout spells, Difficulty with balance, Dizziness, Paralysis, Tremor and Weakness. Psychiatric Not Present- Insomnia.  Vitals Weight: 138 lb Height: 64in Weight was reported by patient. Height was reported by patient. Body Surface Area: 1.67 m Body Mass Index: 23.69 kg/m  BP: 132/74 (Sitting, Right Arm, Standard)  Physical Exam General Mental Status -Alert, cooperative and good historian. General Appearance-pleasant, Not in acute distress. Orientation-Oriented X3. Build & Nutrition-Well nourished and Well developed.  Head and Neck Head-normocephalic, atraumatic . Neck Global Assessment - supple, no bruit auscultated on the right, no bruit auscultated on the left.  Eye Vision-Decreased(left eye blindness). Pupil - Bilateral-Round. Motion -  Bilateral-EOMI.  Chest and Lung Exam Auscultation Breath sounds - clear at anterior chest wall and clear at posterior chest wall. Adventitious sounds - No Adventitious sounds.  Cardiovascular Auscultation Rhythm - Regular rate and rhythm. Heart Sounds - S1 WNL and S2 WNL. Murmurs & Other Heart Sounds - Auscultation of the heart reveals - No Murmurs.  Abdomen Palpation/Percussion Tenderness - Abdomen is non-tender to palpation. Rigidity (guarding) - Abdomen is soft. Auscultation Auscultation of the abdomen reveals - Bowel sounds normal.  Musculoskeletal Note: On physical exam, she is alert and oriented. She is in no acute distress. The left knee, she is slightly tender along the medial joint line. Mild soft tissue swelling but no effusion or instability. No rashes or lesions present. She does have normal painless range of motion in the hip. She is nontender over the right and left greater trochanteric bursa. She does have some discomfort with forward flexion of the lumbar spine, which does cause some pain in the right buttocks, does not radiate. On exam today, she is neurologically intact in lower extremities with strength 5/5 and sensation intact. Distal pulses 2+. AP and lateral views of the right hip show some very mild arthritic change, very minimal joint space narrowing and very slight osteophyte formation along the acetabulum. No fractures noted.  Assessment & Plan Primary osteoarthritis of left knee (Principal Diagnosis) (M17.12)  Note:Surgical Plans: Left Total Knee Replacement  Disposition: Home  PCP: Dr. Shirline Frees - Patient has been seen preoperatively and felt to be stable for surgery.  IV TXA  Anesthesia Issues: None  Signed electronically by Joelene Millin, III PA-C

## 2015-08-02 ENCOUNTER — Encounter (HOSPITAL_COMMUNITY)
Admission: RE | Disposition: A | Discharge status: Home Health | Payer: Self-pay | Source: Ambulatory Visit | Attending: Orthopedic Surgery

## 2015-08-02 ENCOUNTER — Inpatient Hospital Stay (HOSPITAL_COMMUNITY): Payer: PPO | Admitting: Certified Registered Nurse Anesthetist

## 2015-08-02 ENCOUNTER — Encounter (HOSPITAL_COMMUNITY): Payer: Self-pay | Admitting: *Deleted

## 2015-08-02 ENCOUNTER — Inpatient Hospital Stay (HOSPITAL_COMMUNITY)
Admission: RE | Admit: 2015-08-02 | Discharge: 2015-08-04 | DRG: 470 | Disposition: A | Discharge status: Home Health | Payer: PPO | Source: Ambulatory Visit | Attending: Orthopedic Surgery | Admitting: Orthopedic Surgery

## 2015-08-02 DIAGNOSIS — K219 Gastro-esophageal reflux disease without esophagitis: Secondary | ICD-10-CM | POA: Diagnosis present

## 2015-08-02 DIAGNOSIS — M1710 Unilateral primary osteoarthritis, unspecified knee: Secondary | ICD-10-CM | POA: Diagnosis present

## 2015-08-02 DIAGNOSIS — Z87891 Personal history of nicotine dependence: Secondary | ICD-10-CM | POA: Diagnosis not present

## 2015-08-02 DIAGNOSIS — Z01812 Encounter for preprocedural laboratory examination: Secondary | ICD-10-CM

## 2015-08-02 DIAGNOSIS — R112 Nausea with vomiting, unspecified: Secondary | ICD-10-CM | POA: Diagnosis present

## 2015-08-02 DIAGNOSIS — F419 Anxiety disorder, unspecified: Secondary | ICD-10-CM | POA: Diagnosis present

## 2015-08-02 DIAGNOSIS — M25562 Pain in left knee: Secondary | ICD-10-CM | POA: Diagnosis present

## 2015-08-02 DIAGNOSIS — Z79899 Other long term (current) drug therapy: Secondary | ICD-10-CM | POA: Diagnosis not present

## 2015-08-02 DIAGNOSIS — M179 Osteoarthritis of knee, unspecified: Secondary | ICD-10-CM | POA: Diagnosis present

## 2015-08-02 DIAGNOSIS — Z7982 Long term (current) use of aspirin: Secondary | ICD-10-CM

## 2015-08-02 DIAGNOSIS — M8580 Other specified disorders of bone density and structure, unspecified site: Secondary | ICD-10-CM | POA: Diagnosis present

## 2015-08-02 DIAGNOSIS — E785 Hyperlipidemia, unspecified: Secondary | ICD-10-CM | POA: Diagnosis present

## 2015-08-02 DIAGNOSIS — M1712 Unilateral primary osteoarthritis, left knee: Secondary | ICD-10-CM

## 2015-08-02 DIAGNOSIS — H5442 Blindness, left eye, normal vision right eye: Secondary | ICD-10-CM | POA: Diagnosis present

## 2015-08-02 HISTORY — PX: TOTAL KNEE ARTHROPLASTY: SHX125

## 2015-08-02 LAB — TYPE AND SCREEN
ABO/RH(D): O POS
Antibody Screen: NEGATIVE

## 2015-08-02 SURGERY — TOTAL KNEE ARTHROPLASTY
Anesthesia: Spinal | Site: Knee | Laterality: Left

## 2015-08-02 MED ORDER — PROPOFOL 500 MG/50ML IV EMUL
INTRAVENOUS | Status: DC | PRN
  Administered 2015-08-02: 75 ug/kg/min via INTRAVENOUS

## 2015-08-02 MED ORDER — ONDANSETRON HCL 4 MG PO TABS
4.0000 mg | ORAL_TABLET | Freq: Four times a day (QID) | ORAL | Status: DC | PRN
  Administered 2015-08-04: 4 mg via ORAL
  Filled 2015-08-02: qty 1

## 2015-08-02 MED ORDER — RIVAROXABAN 10 MG PO TABS
10.0000 mg | ORAL_TABLET | Freq: Every day | ORAL | Status: DC
  Administered 2015-08-03: 10 mg via ORAL
  Filled 2015-08-02 (×3): qty 1

## 2015-08-02 MED ORDER — PROMETHAZINE HCL 25 MG/ML IJ SOLN
6.2500 mg | Freq: Four times a day (QID) | INTRAMUSCULAR | Status: DC | PRN
  Administered 2015-08-02: 6.25 mg via INTRAVENOUS
  Filled 2015-08-02: qty 1

## 2015-08-02 MED ORDER — BUPIVACAINE IN DEXTROSE 0.75-8.25 % IT SOLN
INTRATHECAL | Status: DC | PRN
  Administered 2015-08-02: 1.8 mL via INTRATHECAL

## 2015-08-02 MED ORDER — POLYETHYLENE GLYCOL 3350 17 G PO PACK: 17.0000 g | PACK | Freq: Every day | ORAL | Status: DC | PRN

## 2015-08-02 MED ORDER — FENTANYL CITRATE (PF) 100 MCG/2ML IJ SOLN
INTRAMUSCULAR | Status: DC | PRN
  Administered 2015-08-02 (×2): 50 ug via INTRAVENOUS

## 2015-08-02 MED ORDER — PHENYLEPHRINE HCL 10 MG/ML IJ SOLN
INTRAMUSCULAR | Status: AC
  Filled 2015-08-02: qty 1

## 2015-08-02 MED ORDER — SODIUM CHLORIDE 0.9 % IJ SOLN
INTRAMUSCULAR | Status: DC | PRN
  Administered 2015-08-02: 30 mL

## 2015-08-02 MED ORDER — ONDANSETRON HCL 4 MG/2ML IJ SOLN: 4.0000 mg | Freq: Once | INTRAMUSCULAR | Status: DC | PRN

## 2015-08-02 MED ORDER — ACETAMINOPHEN 10 MG/ML IV SOLN
1000.0000 mg | Freq: Once | INTRAVENOUS | Status: AC
  Administered 2015-08-02: 1000 mg via INTRAVENOUS

## 2015-08-02 MED ORDER — METHOCARBAMOL 500 MG PO TABS
500.0000 mg | ORAL_TABLET | Freq: Four times a day (QID) | ORAL | Status: DC | PRN
  Administered 2015-08-03 (×2): 500 mg via ORAL
  Filled 2015-08-02 (×3): qty 1

## 2015-08-02 MED ORDER — CEFAZOLIN SODIUM-DEXTROSE 2-3 GM-% IV SOLR
2.0000 g | INTRAVENOUS | Status: AC
  Administered 2015-08-02: 2 g via INTRAVENOUS

## 2015-08-02 MED ORDER — OXYCODONE HCL 5 MG/5ML PO SOLN
5.0000 mg | Freq: Once | ORAL | Status: DC | PRN
  Filled 2015-08-02: qty 5

## 2015-08-02 MED ORDER — PANTOPRAZOLE SODIUM 40 MG PO TBEC
40.0000 mg | DELAYED_RELEASE_TABLET | Freq: Every day | ORAL | Status: DC
  Administered 2015-08-03 – 2015-08-04 (×2): 40 mg via ORAL
  Filled 2015-08-02 (×3): qty 1

## 2015-08-02 MED ORDER — MORPHINE SULFATE (PF) 2 MG/ML IV SOLN
1.0000 mg | INTRAVENOUS | Status: DC | PRN
  Administered 2015-08-03 (×2): 1 mg via INTRAVENOUS
  Filled 2015-08-02 (×2): qty 1

## 2015-08-02 MED ORDER — BUPIVACAINE HCL 0.25 % IJ SOLN
INTRAMUSCULAR | Status: DC | PRN
  Administered 2015-08-02: 20 mL

## 2015-08-02 MED ORDER — BISACODYL 10 MG RE SUPP: 10.0000 mg | Freq: Every day | RECTAL | Status: DC | PRN

## 2015-08-02 MED ORDER — TRANEXAMIC ACID 1000 MG/10ML IV SOLN
1000.0000 mg | INTRAVENOUS | Status: AC
  Administered 2015-08-02: 1000 mg via INTRAVENOUS
  Filled 2015-08-02: qty 10

## 2015-08-02 MED ORDER — BUPIVACAINE LIPOSOME 1.3 % IJ SUSP
INTRAMUSCULAR | Status: DC | PRN
  Administered 2015-08-02: 20 mL

## 2015-08-02 MED ORDER — MIDAZOLAM HCL 2 MG/2ML IJ SOLN
INTRAMUSCULAR | Status: AC
  Filled 2015-08-02: qty 2

## 2015-08-02 MED ORDER — METOCLOPRAMIDE HCL 10 MG PO TABS: 5.0000 mg | ORAL_TABLET | Freq: Three times a day (TID) | ORAL | Status: DC | PRN

## 2015-08-02 MED ORDER — ONDANSETRON HCL 4 MG/2ML IJ SOLN
4.0000 mg | Freq: Four times a day (QID) | INTRAMUSCULAR | Status: DC | PRN
  Administered 2015-08-02: 4 mg via INTRAVENOUS
  Filled 2015-08-02: qty 2

## 2015-08-02 MED ORDER — DIPHENHYDRAMINE HCL 12.5 MG/5ML PO ELIX: 12.5000 mg | ORAL_SOLUTION | ORAL | Status: DC | PRN

## 2015-08-02 MED ORDER — FENTANYL CITRATE (PF) 100 MCG/2ML IJ SOLN
25.0000 ug | INTRAMUSCULAR | Status: DC | PRN
  Administered 2015-08-02: 0.5 ug via INTRAVENOUS
  Administered 2015-08-02: 50 ug via INTRAVENOUS

## 2015-08-02 MED ORDER — ACETAMINOPHEN 325 MG PO TABS
650.0000 mg | ORAL_TABLET | Freq: Four times a day (QID) | ORAL | Status: DC | PRN
  Administered 2015-08-03: 650 mg via ORAL
  Filled 2015-08-02: qty 2

## 2015-08-02 MED ORDER — CEFAZOLIN SODIUM-DEXTROSE 2-3 GM-% IV SOLR
INTRAVENOUS | Status: AC
  Filled 2015-08-02: qty 50

## 2015-08-02 MED ORDER — SODIUM CHLORIDE 0.9 % IV SOLN
INTRAVENOUS | Status: DC
  Administered 2015-08-02: 17:00:00 via INTRAVENOUS

## 2015-08-02 MED ORDER — OXYCODONE HCL 5 MG PO TABS
5.0000 mg | ORAL_TABLET | ORAL | Status: DC | PRN
  Administered 2015-08-02 (×3): 5 mg via ORAL
  Administered 2015-08-03 – 2015-08-04 (×7): 10 mg via ORAL
  Filled 2015-08-02: qty 2
  Filled 2015-08-02: qty 1
  Filled 2015-08-02: qty 2
  Filled 2015-08-02: qty 1
  Filled 2015-08-02 (×7): qty 2

## 2015-08-02 MED ORDER — PROPOFOL 10 MG/ML IV BOLUS
INTRAVENOUS | Status: AC
  Filled 2015-08-02: qty 20

## 2015-08-02 MED ORDER — METOCLOPRAMIDE HCL 5 MG/ML IJ SOLN
5.0000 mg | Freq: Three times a day (TID) | INTRAMUSCULAR | Status: DC | PRN
  Administered 2015-08-02: 10 mg via INTRAVENOUS
  Filled 2015-08-02: qty 2

## 2015-08-02 MED ORDER — DEXAMETHASONE SODIUM PHOSPHATE 10 MG/ML IJ SOLN
10.0000 mg | Freq: Once | INTRAMUSCULAR | Status: AC
  Administered 2015-08-03: 10 mg via INTRAVENOUS
  Filled 2015-08-02: qty 1

## 2015-08-02 MED ORDER — TEMAZEPAM 15 MG PO CAPS
15.0000 mg | ORAL_CAPSULE | Freq: Every evening | ORAL | Status: DC | PRN
  Administered 2015-08-03: 15 mg via ORAL
  Filled 2015-08-02: qty 1

## 2015-08-02 MED ORDER — PHENYLEPHRINE 40 MCG/ML (10ML) SYRINGE FOR IV PUSH (FOR BLOOD PRESSURE SUPPORT)
PREFILLED_SYRINGE | INTRAVENOUS | Status: AC
  Filled 2015-08-02: qty 20

## 2015-08-02 MED ORDER — CEFAZOLIN SODIUM-DEXTROSE 2-3 GM-% IV SOLR
2.0000 g | Freq: Four times a day (QID) | INTRAVENOUS | Status: AC
  Administered 2015-08-02 – 2015-08-03 (×2): 2 g via INTRAVENOUS
  Filled 2015-08-02 (×2): qty 50

## 2015-08-02 MED ORDER — LACTATED RINGERS IV SOLN
INTRAVENOUS | Status: DC | PRN
  Administered 2015-08-02 (×3): via INTRAVENOUS

## 2015-08-02 MED ORDER — MENTHOL 3 MG MT LOZG: 1.0000 | LOZENGE | OROMUCOSAL | Status: DC | PRN

## 2015-08-02 MED ORDER — ACETAMINOPHEN 10 MG/ML IV SOLN
INTRAVENOUS | Status: AC
  Filled 2015-08-02: qty 100

## 2015-08-02 MED ORDER — ALPRAZOLAM 0.25 MG PO TABS
0.2500 mg | ORAL_TABLET | Freq: Every evening | ORAL | Status: DC | PRN
  Administered 2015-08-02: 0.25 mg via ORAL
  Filled 2015-08-02 (×2): qty 1

## 2015-08-02 MED ORDER — PHENYLEPHRINE HCL 10 MG/ML IJ SOLN
INTRAMUSCULAR | Status: DC | PRN
  Administered 2015-08-02: 120 ug via INTRAVENOUS
  Administered 2015-08-02: 80 ug via INTRAVENOUS
  Administered 2015-08-02: 40 ug via INTRAVENOUS
  Administered 2015-08-02 (×2): 80 ug via INTRAVENOUS

## 2015-08-02 MED ORDER — 0.9 % SODIUM CHLORIDE (POUR BTL) OPTIME
TOPICAL | Status: DC | PRN
  Administered 2015-08-02: 1000 mL

## 2015-08-02 MED ORDER — ONDANSETRON HCL 4 MG/2ML IJ SOLN
INTRAMUSCULAR | Status: AC
  Filled 2015-08-02: qty 2

## 2015-08-02 MED ORDER — DEXAMETHASONE SODIUM PHOSPHATE 10 MG/ML IJ SOLN
10.0000 mg | Freq: Once | INTRAMUSCULAR | Status: AC
  Administered 2015-08-02: 10 mg via INTRAVENOUS

## 2015-08-02 MED ORDER — MIDAZOLAM HCL 5 MG/5ML IJ SOLN
INTRAMUSCULAR | Status: DC | PRN
  Administered 2015-08-02: 2 mg via INTRAVENOUS

## 2015-08-02 MED ORDER — SODIUM CHLORIDE 0.9 % IJ SOLN
INTRAMUSCULAR | Status: AC
  Filled 2015-08-02: qty 50

## 2015-08-02 MED ORDER — BUPIVACAINE HCL (PF) 0.25 % IJ SOLN
INTRAMUSCULAR | Status: AC
  Filled 2015-08-02: qty 30

## 2015-08-02 MED ORDER — PHENYLEPHRINE HCL 10 MG/ML IJ SOLN
10.0000 mg | INTRAVENOUS | Status: DC | PRN
  Administered 2015-08-02: 20 ug/min via INTRAVENOUS

## 2015-08-02 MED ORDER — BUPIVACAINE LIPOSOME 1.3 % IJ SUSP
20.0000 mL | Freq: Once | INTRAMUSCULAR | Status: DC
  Filled 2015-08-02: qty 20

## 2015-08-02 MED ORDER — SODIUM CHLORIDE 0.9 % IR SOLN
Status: DC | PRN
  Administered 2015-08-02: 1000 mL

## 2015-08-02 MED ORDER — METHOCARBAMOL 1000 MG/10ML IJ SOLN
500.0000 mg | Freq: Four times a day (QID) | INTRAVENOUS | Status: DC | PRN
  Administered 2015-08-02: 500 mg via INTRAVENOUS
  Filled 2015-08-02 (×2): qty 5

## 2015-08-02 MED ORDER — FENTANYL CITRATE (PF) 100 MCG/2ML IJ SOLN
INTRAMUSCULAR | Status: AC
  Filled 2015-08-02: qty 2

## 2015-08-02 MED ORDER — OXYCODONE HCL 5 MG PO TABS: 5.0000 mg | ORAL_TABLET | Freq: Once | ORAL | Status: DC | PRN

## 2015-08-02 MED ORDER — SODIUM CHLORIDE 0.9 % IV SOLN: INTRAVENOUS | Status: DC

## 2015-08-02 MED ORDER — HYDROMORPHONE HCL 1 MG/ML IJ SOLN
INTRAMUSCULAR | Status: AC
  Filled 2015-08-02: qty 1

## 2015-08-02 MED ORDER — PHENOL 1.4 % MT LIQD: 1.0000 | OROMUCOSAL | Status: DC | PRN

## 2015-08-02 MED ORDER — FLEET ENEMA 7-19 GM/118ML RE ENEM: 1.0000 | ENEMA | Freq: Once | RECTAL | Status: DC | PRN

## 2015-08-02 MED ORDER — DOCUSATE SODIUM 100 MG PO CAPS
100.0000 mg | ORAL_CAPSULE | Freq: Two times a day (BID) | ORAL | Status: DC
  Administered 2015-08-02 – 2015-08-04 (×4): 100 mg via ORAL

## 2015-08-02 MED ORDER — CHLORHEXIDINE GLUCONATE 4 % EX LIQD: 60.0000 mL | Freq: Once | CUTANEOUS | Status: DC

## 2015-08-02 MED ORDER — PROPOFOL 10 MG/ML IV BOLUS
INTRAVENOUS | Status: AC
  Filled 2015-08-02: qty 40

## 2015-08-02 MED ORDER — HYDROMORPHONE HCL 1 MG/ML IJ SOLN
0.2500 mg | INTRAMUSCULAR | Status: DC | PRN
  Administered 2015-08-02 (×3): 0.5 mg via INTRAVENOUS

## 2015-08-02 MED ORDER — ACETAMINOPHEN 500 MG PO TABS
1000.0000 mg | ORAL_TABLET | Freq: Four times a day (QID) | ORAL | Status: AC
  Administered 2015-08-02 – 2015-08-03 (×3): 1000 mg via ORAL
  Filled 2015-08-02 (×4): qty 2

## 2015-08-02 MED ORDER — ACETAMINOPHEN 650 MG RE SUPP: 650.0000 mg | Freq: Four times a day (QID) | RECTAL | Status: DC | PRN

## 2015-08-02 MED ORDER — TRAMADOL HCL 50 MG PO TABS: 50.0000 mg | ORAL_TABLET | Freq: Four times a day (QID) | ORAL | Status: DC | PRN

## 2015-08-02 SURGICAL SUPPLY — 55 items
BAG DECANTER FOR FLEXI CONT (MISCELLANEOUS) ×2
BAG SPEC THK2 15X12 ZIP CLS (MISCELLANEOUS) ×1
BAG ZIPLOCK 12X15 (MISCELLANEOUS) ×2
BANDAGE ACE 6X5 VEL STRL LF (GAUZE/BANDAGES/DRESSINGS) ×2
BANDAGE ELASTIC 6 VELCRO ST LF (GAUZE/BANDAGES/DRESSINGS) ×2
BLADE SAG 18X100X1.27 (BLADE) ×2
BLADE SAW SGTL 11.0X1.19X90.0M (BLADE) ×2
BOWL SMART MIX CTS (DISPOSABLE) ×2
CAPT KNEE TOTAL 3 ATTUNE ×2 IMPLANT
CEMENT HV SMART SET (Cement) ×4 IMPLANT
CLOTH BEACON ORANGE TIMEOUT ST (SAFETY) ×2
CUFF TOURN SGL QUICK 34 (TOURNIQUET CUFF) ×2
CUFF TRNQT CYL 34X4X40X1 (TOURNIQUET CUFF) ×1
DECANTER SPIKE VIAL GLASS SM (MISCELLANEOUS) ×2
DRAPE U-SHAPE 47X51 STRL (DRAPES) ×2
DRSG ADAPTIC 3X8 NADH LF (GAUZE/BANDAGES/DRESSINGS) ×2
DRSG PAD ABDOMINAL 8X10 ST (GAUZE/BANDAGES/DRESSINGS) ×2
DURAPREP 26ML APPLICATOR (WOUND CARE) ×2
ELECT REM PT RETURN 9FT ADLT (ELECTROSURGICAL) ×2
EVACUATOR 1/8 PVC DRAIN (DRAIN) ×2
GAUZE SPONGE 4X4 12PLY STRL (GAUZE/BANDAGES/DRESSINGS) ×2
GLOVE BIO SURGEON STRL SZ7.5 (GLOVE) ×4
GLOVE BIO SURGEON STRL SZ8 (GLOVE) ×2
GLOVE BIOGEL PI IND STRL 6.5 (GLOVE)
GLOVE BIOGEL PI IND STRL 7.0 (GLOVE) ×3
GLOVE BIOGEL PI IND STRL 7.5 (GLOVE) ×2
GLOVE BIOGEL PI IND STRL 8 (GLOVE) ×2
GLOVE BIOGEL PI INDICATOR 6.5 (GLOVE)
GLOVE BIOGEL PI INDICATOR 7.0 (GLOVE) ×3
GLOVE BIOGEL PI INDICATOR 7.5 (GLOVE) ×2
GLOVE BIOGEL PI INDICATOR 8 (GLOVE) ×2
GLOVE SURG SS PI 6.5 STRL IVOR (GLOVE)
GLOVE SURG SS PI 7.0 STRL IVOR (GLOVE) ×2
GOWN STRL REUS W/TWL LRG LVL3 (GOWN DISPOSABLE) ×4
GOWN STRL REUS W/TWL XL LVL3 (GOWN DISPOSABLE) ×6
HANDPIECE INTERPULSE COAX TIP (DISPOSABLE) ×2
IMMOBILIZER KNEE 20 (SOFTGOODS) ×2
MANIFOLD NEPTUNE II (INSTRUMENTS) ×2
NS IRRIG 1000ML POUR BTL (IV SOLUTION) ×2
PACK TOTAL KNEE CUSTOM (KITS) ×2
PAD ABD 8X10 STRL (GAUZE/BANDAGES/DRESSINGS) ×2
PADDING CAST COTTON 6X4 STRL (CAST SUPPLIES) ×2
POSITIONER SURGICAL ARM (MISCELLANEOUS) ×2
SET HNDPC FAN SPRY TIP SCT (DISPOSABLE) ×1
STRIP CLOSURE SKIN 1/2X4 (GAUZE/BANDAGES/DRESSINGS) ×2
SUT MNCRL AB 4-0 PS2 18 (SUTURE) ×2
SUT VIC AB 2-0 CT1 27 (SUTURE) ×6
SUT VIC AB 2-0 CT1 TAPERPNT 27 (SUTURE) ×3
SUT VLOC 180 0 24IN GS25 (SUTURE) ×2
SYR 50ML LL SCALE MARK (SYRINGE) ×2
TRAY FOLEY W/METER SILVER 14FR (SET/KITS/TRAYS/PACK) ×2
TRAY FOLEY W/METER SILVER 16FR (SET/KITS/TRAYS/PACK)
WATER STERILE IRR 1500ML POUR (IV SOLUTION) ×2
WRAP KNEE MAXI GEL POST OP (GAUZE/BANDAGES/DRESSINGS) ×2
YANKAUER SUCT BULB TIP 10FT TU (MISCELLANEOUS) ×2

## 2015-08-02 NOTE — Interval H&P Note (Signed)
History and Physical Interval Note:  08/02/2015 12:21 PM  [name]  has presented today for surgery, with the diagnosis of left knee osteoarthritis  The various methods of treatment have been discussed with the patient and family. After consideration of risks, benefits and other options for treatment, the patient has consented to  Procedure(s): LEFT TOTAL KNEE ARTHROPLASTY (Left) as a surgical intervention .  The patient's history has been reviewed, patient examined, no change in status, stable for surgery.  I have reviewed the patient's chart and labs.  Questions were answered to the patient's satisfaction.     Gearlean Alf

## 2015-08-02 NOTE — Progress Notes (Signed)
Utilization review completed.  

## 2015-08-02 NOTE — Anesthesia Procedure Notes (Addendum)
Spinal Patient location during procedure: OR Start time: 08/02/2015 12:30 PM End time: 08/02/2015 12:35 PM Staffing Resident/CRNA: Enrigue Catena E Performed by: resident/CRNA  Preanesthetic Checklist Completed: patient identified, site marked, surgical consent, pre-op evaluation, timeout performed, IV checked, risks and benefits discussed and monitors and equipment checked Spinal Block Patient position: sitting Prep: Betadine Patient monitoring: heart rate, continuous pulse ox and blood pressure Location: L3-4 Injection technique: single-shot Needle Needle type: Quincke  Needle gauge: 22 G Needle length: 9 cm Additional Notes Expiration date of kit checked and confirmed. Patient tolerated procedure well, without complications.

## 2015-08-02 NOTE — Op Note (Signed)
Pre-operative diagnosis- Osteoarthritis  Left knee(s)  Post-operative diagnosis- Osteoarthritis Left knee(s)  Procedure-  Left  Total Knee Arthroplasty  Surgeon- Jennifer Plover. Gyneth Hubka, MD  Assistant- Arlee Muslim, PA-C   Anesthesia-  Spinal  EBL-* No blood loss amount entered *   Drains Hemovac  Tourniquet time-  Total Tourniquet Time Documented: Thigh (Left) - 32 minutes Total: Thigh (Left) - 32 minutes     Complications- None  Condition-PACU - hemodynamically stable.   Brief Clinical Note  [name] is a 70 y.o. year old female with end stage OA of her left knee with progressively worsening pain and dysfunction. She has constant pain, with activity and at rest and significant functional deficits with difficulties even with ADLs. She has had extensive non-op management including analgesics, injections of cortisone and viscosupplements, and home exercise program, but remains in significant pain with significant dysfunction. Radiographs show bone on bone arthritis medial and patellofemoral. She presents now for left Total Knee Arthroplasty.    Procedure in detail---   The patient is brought into the operating room and positioned supine on the operating table. After successful administration of  Spinal,   a tourniquet is placed high on the  Left thigh(s) and the lower extremity is prepped and draped in the usual sterile fashion. Time out is performed by the operating team and then the  Left lower extremity is wrapped in Esmarch, knee flexed and the tourniquet inflated to 300 mmHg.       A midline incision is made with a ten blade through the subcutaneous tissue to the level of the extensor mechanism. A fresh blade is used to make a medial parapatellar arthrotomy. Soft tissue over the proximal medial tibia is subperiosteally elevated to the joint line with a knife and into the semimembranosus bursa with a Cobb elevator. Soft tissue over the proximal lateral tibia is elevated with  attention being paid to avoiding the patellar tendon on the tibial tubercle. The patella is everted, knee flexed 90 degrees and the ACL and PCL are removed. Findings are bone on bone medial and patellofemoral with large medial osteophytes.       The drill is used to create a starting hole in the distal femur and the canal is thoroughly irrigated with sterile saline to remove the fatty contents. The 5 degree Left  valgus alignment guide is placed into the femoral canal and the distal femoral cutting block is pinned to remove 10 mm off the distal femur. Resection is made with an oscillating saw.      The tibia is subluxed forward and the menisci are removed. The extramedullary alignment guide is placed referencing proximally at the medial aspect of the tibial tubercle and distally along the second metatarsal axis and tibial crest. The block is pinned to remove 2mm off the more deficient medial  side. Resection is made with an oscillating saw. Size 6is the most appropriate size for the tibia and the proximal tibia is prepared with the modular drill and keel punch for that size.      The femoral sizing guide is placed and size 6 is most appropriate. Rotation is marked off the epicondylar axis and confirmed by creating a rectangular flexion gap at 90 degrees. The size 6 cutting block is pinned in this rotation and the anterior, posterior and chamfer cuts are made with the oscillating saw. The intercondylar block is then placed and that cut is made.      Trial size 6 tibial component, trial  size 6 narrow posterior stabilized femur and a 8  mm posterior stabilized rotating platform insert trial is placed. Full extension is achieved with excellent varus/valgus and anterior/posterior balance throughout full range of motion. The patella is everted and thickness measured to be 22  mm. Free hand resection is taken to 12 mm, a 38 template is placed, lug holes are drilled, trial patella is placed, and it tracks normally.  Osteophytes are removed off the posterior femur with the trial in place. All trials are removed and the cut bone surfaces prepared with pulsatile lavage. Cement is mixed and once ready for implantation, the size 6 tibial implant, size  6 narrow posterior stabilized femoral component, and the size 38 patella are cemented in place and the patella is held with the clamp. The trial insert is placed and the knee held in full extension. The Exparel (20 ml mixed with 30 ml saline) and .25% Bupivicaine, are injected into the extensor mechanism, posterior capsule, medial and lateral gutters and subcutaneous tissues.  All extruded cement is removed and once the cement is hard the permanent 8 mm posterior stabilized rotating platform insert is placed into the tibial tray.      The wound is copiously irrigated with saline solution and the extensor mechanism closed over a hemovac drain with #1 V-loc suture. The tourniquet is released for a total tourniquet time of 32  minutes. Flexion against gravity is 140 degrees and the patella tracks normally. Subcutaneous tissue is closed with 2.0 vicryl and subcuticular with running 4.0 Monocryl. The incision is cleaned and dried and steri-strips and a bulky sterile dressing are applied. The limb is placed into a knee immobilizer and the patient is awakened and transported to recovery in stable condition.      Please note that a surgical assistant was a medical necessity for this procedure in order to perform it in a safe and expeditious manner. Surgical assistant was necessary to retract the ligaments and vital neurovascular structures to prevent injury to them and also necessary for proper positioning of the limb to allow for anatomic placement of the prosthesis.   Jennifer Plover [name], MD    08/02/2015, 1:34 PM

## 2015-08-02 NOTE — Transfer of Care (Signed)
Immediate Anesthesia Transfer of Care Note  Patient: [name]  Procedure(s) Performed: Procedure(s): LEFT TOTAL KNEE ARTHROPLASTY (Left)  Patient Location: PACU  Anesthesia Type:Spinal  Level of Consciousness: awake, alert , oriented and patient cooperative  Airway & Oxygen Therapy: Patient Spontanous Breathing and Patient connected to face mask oxygen  Post-op Assessment: Report given to RN and Post -op Vital signs reviewed and stable  Post vital signs: stable  Last Vitals:  Filed Vitals:   08/02/15 1020  BP: 132/79  Pulse: 76  Temp: 36.3 C  Resp: 18    Complications: No apparent anesthesia complications  L3 spinal level

## 2015-08-02 NOTE — Anesthesia Postprocedure Evaluation (Signed)
Anesthesia Post Note  Patient: [name]  Procedure(s) Performed: Procedure(s) (LRB): LEFT TOTAL KNEE ARTHROPLASTY (Left)  Patient location during evaluation: PACU Anesthesia Type: General Level of consciousness: awake and alert Pain management: pain level controlled Vital Signs Assessment: post-procedure vital signs reviewed and stable Respiratory status: spontaneous breathing, nonlabored ventilation, respiratory function stable and patient connected to nasal cannula oxygen Cardiovascular status: blood pressure returned to baseline and stable Postop Assessment: no signs of nausea or vomiting Anesthetic complications: no    Last Vitals:  Filed Vitals:   08/02/15 1430 08/02/15 1445  BP: 141/81 121/108  Pulse: 77 72  Temp:    Resp: 32 14    Last Pain:  Filed Vitals:   08/02/15 1448  PainSc: 7                  Zenaida Deed

## 2015-08-02 NOTE — H&P (View-Only) (Signed)
[name] DOB: 03-15-1946 Married / Language: English / Race: White Female Date of Admission:  08/02/2015 CC:  Left Knee Pain History of Present Illness The patient is a 70 year old female who comes in for a preoperative History and Physical. The patient is scheduled for a left total knee arthroplasty to be performed by Dr. Dione Plover. Aluisio, MD at [facility] on 08-02-2015. The patient is a 70 year old female who has been followed for their left knee. The patient is being followed for their left osteoarthritis. They are now out from Synvisc injections. Symptoms reported include: pain (medial pain on and off). The patient has had some benefit from the gel series but only shortlived. Current treatment includes: pain medications. The following medication has been used for pain control: Hydrocodone and Tylenol. She says she got temporary benefit but did not last very long. She does occasionally have some discomfort in her knee. She would, however, like to go ahead and proceed with knee replacement. She feels like she is at a point where she would like a more permanent solution as opposed to these temporary solutions with a viscosupplementation. They have been treated conservatively in the past for the above stated problem and despite conservative measures, they continue to have progressive pain and severe functional limitations and dysfunction. They have failed non-operative management including home exercise, medications, and injections. It is felt that they would benefit from undergoing total joint replacement. Risks and benefits of the procedure have been discussed with the patient and they elect to proceed with surgery. There are no active contraindications to surgery such as ongoing infection or rapidly progressive neurological disease.  Problem List/Past Medical  Primary osteoarthritis of left knee (M17.12)  Chronic sciatica of right side (M54.31)  Chronic Sciatica of Left Side   Cataract  Bilateral Impaired Vision  Left Eye Blindness Ruptured Disc at L4  Hyperlipidemia  Anxiety Disorder  Insomnia  Gastroesophageal Reflux Disease  Palpitations   Allergies No Known Drug Allergies  Family History Hypertension  brother and grandmother mothers side Osteoporosis  mother Rheumatoid Arthritis  mother Heart Disease  grandmother fathers side Cancer  father Cerebrovascular Accident  mother, grandmother mothers side and grandfather mothers side Diabetes Mellitus  father and brother  Social History Number of flights of stairs before winded  1 Marital status  married Living situation  live with spouse Tobacco use  former smoker; smoke(d) less than 1/2 pack(s) per day Tobacco / smoke exposure  no Pain Contract  no Illicit drug use  no Current work status  retired Children  1 Alcohol use  never consumed alcohol Exercise  Exercises never Drug/Alcohol Rehab (Previously)  no Drug/Alcohol Rehab (Currently)  no Post-Surgical Plans  Home  Medication History Potassium Chloride (20MEQ Tablet ER, Oral) Active. ALPRAZolam (0.25MG  Tablet, Oral) Active. Temazepam (15MG  Capsule, Oral) Active. Estradiol (1MG  Tablet, Oral) Active. Stool Softner Active. Aspirin Adult Low Strength (81MG  Tablet Chewable, Oral daily) Active. Complete Senior (Oral daily) Active. Fish Oil Concentrate (435MG  Capsule, 1 (one) Oral) Active. Red Yeast Rice (600MG  Capsule, 1 (one) Oral daily) Active. Chewable Calcium (Oral) Specific strength unknown - Active. Glucosamine Triple Strength Active. Pantoprazole Sodium (40MG  Tablet DR, Oral as needed) Active. Acetaminophen (500MG  Capsule, Oral as needed) Active. Cetirizine HCl (10MG  Tablet Chewable, Oral as needed) Active. Glycerin (Adult) (2.1GM Suppository, Rectal as needed) Active.   Past Surgical History Spinal Surgery  Date: 04/2008. Hysterectomy  Date: 08/1979. partial (cancerous) Colon Polyp  Removal - Colonoscopy   Review of  Systems General Not Present- Chills, Fatigue, Fever, Memory Loss, Night Sweats, Weight Gain and Weight Loss. Skin Not Present- Eczema, Hives, Itching, Lesions and Rash. HEENT Not Present- Dentures, Double Vision, Headache, Hearing Loss, Tinnitus and Visual Loss. Respiratory Not Present- Allergies, Chronic Cough, Coughing up blood, Shortness of breath at rest and Shortness of breath with exertion. Cardiovascular Not Present- Chest Pain, Difficulty Breathing Lying Down, Murmur, Palpitations, Racing/skipping heartbeats and Swelling. Gastrointestinal Not Present- Abdominal Pain, Bloody Stool, Constipation, Diarrhea, Difficulty Swallowing, Heartburn, Jaundice, Loss of appetitie, Nausea and Vomiting. Female Genitourinary Not Present- Blood in Urine, Discharge, Flank Pain, Incontinence, Painful Urination, Urgency, Urinary frequency, Urinary Retention, Urinating at Night and Weak urinary stream. Musculoskeletal Present- Back Pain (Ruptured Disc L4) and Joint Pain. Not Present- Joint Swelling, Morning Stiffness, Muscle Pain, Muscle Weakness and Spasms. Neurological Not Present- Blackout spells, Difficulty with balance, Dizziness, Paralysis, Tremor and Weakness. Psychiatric Not Present- Insomnia.  Vitals Weight: 138 lb Height: 64in Weight was reported by patient. Height was reported by patient. Body Surface Area: 1.67 m Body Mass Index: 23.69 kg/m  BP: 132/74 (Sitting, Right Arm, Standard)  Physical Exam General Mental Status -Alert, cooperative and good historian. General Appearance-pleasant, Not in acute distress. Orientation-Oriented X3. Build & Nutrition-Well nourished and Well developed.  Head and Neck Head-normocephalic, atraumatic . Neck Global Assessment - supple, no bruit auscultated on the right, no bruit auscultated on the left.  Eye Vision-Decreased(left eye blindness). Pupil - Bilateral-Round. Motion -  Bilateral-EOMI.  Chest and Lung Exam Auscultation Breath sounds - clear at anterior chest wall and clear at posterior chest wall. Adventitious sounds - No Adventitious sounds.  Cardiovascular Auscultation Rhythm - Regular rate and rhythm. Heart Sounds - S1 WNL and S2 WNL. Murmurs & Other Heart Sounds - Auscultation of the heart reveals - No Murmurs.  Abdomen Palpation/Percussion Tenderness - Abdomen is non-tender to palpation. Rigidity (guarding) - Abdomen is soft. Auscultation Auscultation of the abdomen reveals - Bowel sounds normal.  Musculoskeletal Note: On physical exam, she is alert and oriented. She is in no acute distress. The left knee, she is slightly tender along the medial joint line. Mild soft tissue swelling but no effusion or instability. No rashes or lesions present. She does have normal painless range of motion in the hip. She is nontender over the right and left greater trochanteric bursa. She does have some discomfort with forward flexion of the lumbar spine, which does cause some pain in the right buttocks, does not radiate. On exam today, she is neurologically intact in lower extremities with strength 5/5 and sensation intact. Distal pulses 2+. AP and lateral views of the right hip show some very mild arthritic change, very minimal joint space narrowing and very slight osteophyte formation along the acetabulum. No fractures noted.  Assessment & Plan Primary osteoarthritis of left knee (Principal Diagnosis) (M17.12)  Note:Surgical Plans: Left Total Knee Replacement  Disposition: Home  PCP: Dr. Shirline Frees - Patient has been seen preoperatively and felt to be stable for surgery.  IV TXA  Anesthesia Issues: None  Signed electronically by Joelene Millin, III PA-C

## 2015-08-02 NOTE — Anesthesia Preprocedure Evaluation (Addendum)
Anesthesia Evaluation  Patient identified by MRN, date of birth, ID band Patient awake    Reviewed: Allergy & Precautions, H&P , NPO status , Patient's Chart, lab work & pertinent test results  History of Anesthesia Complications Negative for: history of anesthetic complications  Airway Mallampati: II  TM Distance: >3 FB Neck ROM: full    Dental no notable dental hx.    Pulmonary neg pulmonary ROS,    Pulmonary exam normal breath sounds clear to auscultation       Cardiovascular negative cardio ROS Normal cardiovascular exam Rhythm:regular Rate:Normal     Neuro/Psych  Neuromuscular disease    GI/Hepatic Neg liver ROS, GERD  ,  Endo/Other  negative endocrine ROS  Renal/GU negative Renal ROS     Musculoskeletal  (+) Arthritis , Patient with previous L4 disk herniation with radicular symptoms that prompted lumbar laminectomy to relieve pressure. She has intermittently required anti-inflammatory medications since then.    Abdominal   Peds  Hematology negative hematology ROS (+)   Anesthesia Other Findings   Reproductive/Obstetrics negative OB ROS                            Anesthesia Physical Anesthesia Plan  ASA: II  Anesthesia Plan: Spinal   Post-op Pain Management:    Induction: Intravenous  Airway Management Planned: Simple Face Mask  Additional Equipment:   Intra-op Plan:   Post-operative Plan:   Informed Consent: I have reviewed the patients History and Physical, chart, labs and discussed the procedure including the risks, benefits and alternatives for the proposed anesthesia with the patient or authorized representative who has indicated his/her understanding and acceptance.   Dental Advisory Given  Plan Discussed with: Anesthesiologist, CRNA and Surgeon  Anesthesia Plan Comments: (All risks and benefits of spinal anesthesia)       Anesthesia Quick Evaluation

## 2015-08-03 ENCOUNTER — Encounter (HOSPITAL_COMMUNITY): Payer: Self-pay | Admitting: Orthopedic Surgery

## 2015-08-03 LAB — CBC
HCT: 34.2 % — ABNORMAL LOW (ref 36.0–46.0)
Hemoglobin: 11.2 g/dL — ABNORMAL LOW (ref 12.0–15.0)
MCH: 31.8 pg (ref 26.0–34.0)
MCHC: 32.7 g/dL (ref 30.0–36.0)
MCV: 97.2 fL (ref 78.0–100.0)
Platelets: 208 10*3/uL (ref 150–400)
RBC: 3.52 MIL/uL — ABNORMAL LOW (ref 3.87–5.11)
RDW: 12.8 % (ref 11.5–15.5)
WBC: 11.4 10*3/uL — ABNORMAL HIGH (ref 4.0–10.5)

## 2015-08-03 LAB — BASIC METABOLIC PANEL WITH GFR
Anion gap: 9 (ref 5–15)
BUN: 8 mg/dL (ref 6–20)
CO2: 27 mmol/L (ref 22–32)
Calcium: 8.7 mg/dL — ABNORMAL LOW (ref 8.9–10.3)
Chloride: 105 mmol/L (ref 101–111)
Creatinine, Ser: 0.6 mg/dL (ref 0.44–1.00)
GFR calc Af Amer: >60 mL/min
GFR calc non Af Amer: >60 mL/min
Glucose, Bld: 114 mg/dL — ABNORMAL HIGH (ref 65–99)
Potassium: 3.9 mmol/L (ref 3.5–5.1)
Sodium: 141 mmol/L (ref 135–145)

## 2015-08-03 MED ORDER — LACTATED RINGERS IV SOLN
INTRAVENOUS | Status: DC
  Administered 2015-08-03: 02:00:00 via INTRAVENOUS

## 2015-08-03 MED ORDER — ALPRAZOLAM 0.25 MG PO TABS
0.2500 mg | ORAL_TABLET | Freq: Three times a day (TID) | ORAL | Status: DC | PRN
  Administered 2015-08-03 (×2): 0.25 mg via ORAL
  Filled 2015-08-03: qty 1

## 2015-08-03 NOTE — Evaluation (Signed)
Occupational Therapy Evaluation Patient Details Name: [name] MRN: [id number] DOB: 1945/10/07 Today's Date: 08/03/2015    History of Present Illness TKR   Clinical Impression   Pt is s/p TKA resulting in the deficits listed below (see OT Problem List).  Pt will benefit from skilled OT to increase their safety and independence with ADL and functional mobility for ADL to facilitate discharge to venue listed below.        Follow Up Recommendations  No OT follow up    Equipment Recommendations  3 in 1 bedside comode           Mobility Bed Mobility Overal bed mobility: Needs Assistance Bed Mobility: Sit to Supine       Sit to supine: Mod assist      Transfers Overall transfer level: Needs assistance Equipment used: Rolling walker (2 wheeled) Transfers: Sit to/from [facility] Sit to Stand: Mod assist Stand pivot transfers: Mod assist       General transfer comment: VC for safety. pain impacting this OT session         ADL Overall ADL's : Needs assistance/impaired Eating/Feeding: Modified independent;Sitting   Grooming: Set up;Sitting   Upper Body Bathing: Set up;Sitting   Lower Body Bathing: Moderate assistance;Sit to/from stand;Cueing for sequencing;Cueing for safety   Upper Body Dressing : Set up;Sitting   Lower Body Dressing: Moderate assistance;Sit to/from stand;Cueing for sequencing;Cueing for safety   Toilet Transfer: Moderate assistance;RW;Comfort height toilet   Toileting- Clothing Manipulation and Hygiene: Moderate assistance;Sit to/from stand;Cueing for sequencing         General ADL Comments: pts pain significant. OT Aed pt back to bed               Pertinent Vitals/Pain Pain Assessment: 0-10 Pain Score: 10-Worst pain ever Pain Descriptors / Indicators: Sharp;Restless;Shooting;Grimacing;Crying Pain Intervention(s): Limited activity within patient's tolerance;Monitored during session;Repositioned;Patient  requesting pain meds-RN notified;RN gave pain meds during session;Relaxation;Ice applied        Extremity/Trunk Assessment Upper Extremity Assessment Upper Extremity Assessment: Overall WFL for tasks assessed           Communication Communication Communication: No difficulties   Cognition Arousal/Alertness: Awake/alert Behavior During Therapy: WFL for tasks assessed/performed Overall Cognitive Status: Within Functional Limits for tasks assessed                                Home Living Family/patient expects to be discharged to:: Private residence Living Arrangements: Spouse/significant other Available Help at Discharge: Family Type of Home: House Home Access: Stairs to enter Technical brewer of Steps: 3 Entrance Stairs-Rails: None Home Layout: Multi-level;Able to live on main level with bedroom/bathroom     Bathroom Shower/Tub: Occupational psychologist: Standard Bathroom Accessibility: Yes   Home Equipment: None          Prior Functioning/Environment Level of Independence: Independent             OT Diagnosis: Generalized weakness;Acute pain   OT Problem List: Decreased strength;Decreased safety awareness;Pain   OT Treatment/Interventions: Self-care/ADL training;DME and/or AE instruction    OT Goals(Current goals can be found in the care plan section) Acute Rehab OT Goals Patient Stated Goal: decrease pain OT Goal Formulation: With patient Time For Goal Achievement: 08/17/15 Potential to Achieve Goals: Good  OT Frequency: Min 2X/week              End of Session Equipment Utilized During  Treatment: Rolling walker;Back brace (pt has personal back brace) Nurse Communication: Mobility status;Patient requests pain meds  Activity Tolerance: Patient limited by pain Patient left: in bed;with call bell/phone within reach;with nursing/sitter in room   Time: 1026-1038 OT Time Calculation (min): 12 min Charges:  OT General  Charges $OT Visit: 1 Procedure OT Evaluation $OT Eval Low Complexity: 1 Procedure G-Codes:    Payton Mccallum D 2015/08/19, 11:15 AM

## 2015-08-03 NOTE — Care Management Note (Signed)
Case Management Note  Patient Details  Name: KAMREE WIENS MRN: [id number] Date of Birth: 26-Oct-1945  Subjective/Objective:                   LEFT TOTAL KNEE ARTHROPLASTY (Left) Action/Plan:  Discharge planning Expected Discharge Date:  08/04/15               Expected Discharge Plan:  [place]  In-House Referral:     Discharge planning Services  CM Consult  Post Acute Care Choice:  Home Health Choice offered to:  Patient  DME Arranged:  3-N-1, Walker rolling DME Agency:  [place]:  PT [place]:  [place]  Status of Service:  Completed, signed off  Medicare Important Message Given:    Date Medicare IM Given:    Medicare IM give by:    Date Additional Medicare IM Given:    Additional Medicare Important Message give by:     If discussed at [place] of Stay Meetings, dates discussed:    Additional Comments: CM met with pt in room to offer choice of home health agency.  Pt chooses [facility] as she states she wants the same therapist her mother had from [facility].  Referral called to [facility] rep, Santiago Glad.  CM called [facility] DME rep, Lecretia to please deliver the rolling walker prior to discharge.  No other CM needs were communicated. Dellie Catholic, RN 08/03/2015, 2:43 PM

## 2015-08-03 NOTE — Evaluation (Signed)
Physical Therapy Evaluation Patient Details Name: [name] MRN: [id number] DOB: November 09, 1945 Today's Date: 08/03/2015   History of Present Illness  TKR  Clinical Impression  Pt s/p L TKR presents with decreased L LE strength/ROM and post op pain limiting functional mobility.  Pt should progress to dc home with family assist and HHPT follow up.   Follow Up Recommendations Home health PT    Equipment Recommendations  Rolling walker with 5" wheels    Recommendations for Other Services OT consult     Precautions / Restrictions Precautions Precautions: Fall;Back Precaution Comments: Pt rolling out of bed and using corset 2* back issues Required Braces or Orthoses: Knee Immobilizer - Left Knee Immobilizer - Left: Discontinue once straight leg raise with < 10 degree lag Restrictions Weight Bearing Restrictions: No Other Position/Activity Restrictions: WBAT      Mobility  Bed Mobility Overal bed mobility: Needs Assistance Bed Mobility: Sit to Supine     Supine to sit: Min assist;Mod assist Sit to supine: Mod assist   General bed mobility comments: Pt assisted to roll to sidely and move to sitting 2* back issues  Transfers Overall transfer level: Needs assistance Equipment used: Rolling walker (2 wheeled) Transfers: Sit to/from [facility] Sit to Stand: Mod assist Stand pivot transfers: Mod assist       General transfer comment: VC for safety. pain impacting this OT session  Ambulation/Gait Ambulation/Gait assistance: Min assist Ambulation Distance (Feet): 48 Feet Assistive device: Rolling walker (2 wheeled) Gait Pattern/deviations: Step-to pattern;Decreased step length - right;Decreased step length - left;Shuffle;Trunk flexed Gait velocity: decr   General Gait Details: cues for sequence, posture and position from [employer]            Wheelchair Mobility    Modified Rankin (Stroke Patients Only)       Balance                                              Pertinent Vitals/Pain Pain Assessment: 0-10 Pain Score: 10-Worst pain ever Pain Location: L knee Pain Descriptors / Indicators: Sharp;Restless;Shooting;Grimacing;Crying Pain Intervention(s): Limited activity within patient's tolerance;Monitored during session;Repositioned;Patient requesting pain meds-RN notified;RN gave pain meds during session;Relaxation;Ice applied    Home Living Family/patient expects to be discharged to:: Private residence Living Arrangements: Spouse/significant other Available Help at Discharge: Family Type of Home: House Home Access: Stairs to enter Entrance Stairs-Rails: None Entrance Stairs-Number of Steps: 3 Home Layout: Multi-level;Able to live on main level with bedroom/bathroom Home Equipment: None      Prior Function Level of Independence: Independent               Hand Dominance        Extremity/Trunk Assessment   Upper Extremity Assessment: Overall WFL for tasks assessed           Lower Extremity Assessment: LLE deficits/detail   LLE Deficits / Details: 2/5 quad with AAROM at knee -10 - 40     Communication   Communication: No difficulties  Cognition Arousal/Alertness: Awake/alert Behavior During Therapy: WFL for tasks assessed/performed Overall Cognitive Status: Within Functional Limits for tasks assessed                      General Comments      Exercises Total Joint Exercises Ankle Circles/Pumps: AROM;Both;15 reps;Supine Quad Sets: AROM;Both;10 reps;Supine Heel Slides: AAROM;10  reps;Left;Supine Straight Leg Raises: AAROM;Left;10 reps;Supine      Assessment/Plan    PT Assessment Patient needs continued PT services  PT Diagnosis Difficulty walking   PT Problem List Decreased strength;Decreased range of motion;Decreased activity tolerance;Decreased mobility;Decreased knowledge of use of DME;Pain  PT Treatment Interventions DME instruction;Gait training;Stair  training;Functional mobility training;Therapeutic activities;Therapeutic exercise;Patient/family education   PT Goals (Current goals can be found in the Care Plan section) Acute Rehab PT Goals Patient Stated Goal: decrease pain PT Goal Formulation: With patient Time For Goal Achievement: 08/07/15 Potential to Achieve Goals: Good    Frequency 7X/week   Barriers to discharge        Co-evaluation               End of Session Equipment Utilized During Treatment: Gait belt;Left knee immobilizer Activity Tolerance: Patient tolerated treatment well Patient left: in chair;with call bell/phone within reach Nurse Communication: Mobility status         Time: [id number] PT Time Calculation (min) (ACUTE ONLY): 38 min   Charges:   PT Evaluation $PT Eval Low Complexity: 1 Procedure PT Treatments $Gait Training: 8-22 mins $Therapeutic Exercise: 8-22 mins   PT G Codes:        [name] 09/02/15, 12:37 PM

## 2015-08-03 NOTE — Progress Notes (Signed)
Foley discontinued at  06:25am this morning. Pt due to void by 12:25pm this afternoon. Pt know to note staff upon first voiding since foley discontinued.  Will follow up with dayshift nurse.

## 2015-08-03 NOTE — Progress Notes (Signed)
   Subjective: 1 Day Post-Op Procedure(s) (LRB): LEFT TOTAL KNEE ARTHROPLASTY (Left) Patient reports pain as mild.  Biggest complaint was nausea thru the night.   Patient seen in rounds by Dr. Wynelle Link. Patient is having problems with nausea/vomiting.  Treat symptoms this morning. We will start therapy today.  Plan is to go Home after hospital stay.  Objective: Vital signs in last 24 hours: Temp:  [97.3 F (36.3 C)-98.7 F (37.1 C)] 98.2 F (36.8 C) (01/19 0423) Pulse Rate:  [66-78] 78 (01/19 0423) Resp:  [10-32] 16 (01/19 0423) BP: (106-147)/(55-108) 110/59 mmHg (01/19 0423) SpO2:  [98 %-100 %] 100 % (01/19 0423) Weight:  [63.05 kg (139 lb)] 63.05 kg (139 lb) (01/18 1025)  Intake/Output from previous day:  Intake/Output Summary (Last 24 hours) at 08/03/15 0719 Last data filed at 08/03/15 0417  Gross per 24 hour  Intake 3472.5 ml  Output   3630 ml  Net -157.5 ml    Intake/Output this shift: UOP 400 since MN  Labs:  Recent Labs  08/03/15 0430  HGB 11.2*    Recent Labs  08/03/15 0430  WBC 11.4*  RBC 3.52*  HCT 34.2*  PLT 208    Recent Labs  08/03/15 0430  NA 141  K 3.9  CL 105  CO2 27  BUN 8  CREATININE 0.60  GLUCOSE 114*  CALCIUM 8.7*   No results for input(s): LABPT, INR in the last 72 hours.  EXAM General - Patient is Alert, Appropriate and Oriented Extremity - Neurovascular intact Sensation intact distally Dorsiflexion/Plantar flexion intact Dressing - dressing C/D/I Motor Function - intact, moving foot and toes well on exam.  Hemovac pulled without difficulty.  Past Medical History  Diagnosis Date  . Allergy   . Low back pain   . Lumbar herniated disc   . Fluid retention   . Cervical dysplasia   . Osteopenia 06/2015     T score -1.7  . Wears glasses   . Numbness and tingling     feet bilat   . Cataract of both eyes   . Legally blind in left eye, as defined in [place]     since birth   . Syncope   . Urinary hesitancy   . Urinary  frequency   . GERD (gastroesophageal reflux disease)   . Arthritis     Assessment/Plan: 1 Day Post-Op Procedure(s) (LRB): LEFT TOTAL KNEE ARTHROPLASTY (Left) Principal Problem:   OA (osteoarthritis) of knee  Estimated body mass index is 23.85 kg/(m^2) as calculated from the following:   Height as of this encounter: 5\' 4"  (1.626 m).   Weight as of this encounter: 63.05 kg (139 lb). Advance diet as tolerated Up with therapy Discharge home with home health  DVT Prophylaxis - Xarelto Weight-Bearing as tolerated to left leg D/C O2 and Pulse OX and try on Room Air  Arlee Muslim, PA-C Orthopaedic Surgery 08/03/2015, 7:19 AM

## 2015-08-03 NOTE — Progress Notes (Signed)
Physical Therapy Treatment Patient Details Name: [name] MRN: [id number] DOB: [birth year] Today's Date: 08/03/2015    History of Present Illness L TKR    PT Comments    Pt progressing well with mobility and hopeful for dc home tomorrow.  Follow Up Recommendations  Home health PT     Equipment Recommendations  Rolling walker with 5" wheels    Recommendations for Other Services OT consult     Precautions / Restrictions Precautions Precautions: Fall;Back Precaution Comments: Pt rolling out of bed and using corset 2* back issues Required Braces or Orthoses: Knee Immobilizer - Left Knee Immobilizer - Left: Discontinue once straight leg raise with < 10 degree lag Restrictions Weight Bearing Restrictions: No    Mobility  Bed Mobility Overal bed mobility: Needs Assistance Bed Mobility: Supine to Sit;Sit to Supine     Supine to sit: Min assist Sit to supine: Min assist   General bed mobility comments: Pt assisted to roll to sidely and move to sitting 2* back issues  Transfers Overall transfer level: Needs assistance Equipment used: Rolling walker (2 wheeled) Transfers: Sit to/from Stand Sit to Stand: Min assist Stand pivot transfers: Mod assist       General transfer comment: cues for LE management and use of UEs to self assist.  Pt to/from EOB and commode  Ambulation/Gait Ambulation/Gait assistance: Min assist;Min guard Ambulation Distance (Feet): 80 Feet (and 20' from bathroom) Assistive device: Rolling walker (2 wheeled) Gait Pattern/deviations: Step-to pattern;Decreased step length - right;Decreased step length - left;Shuffle;Trunk flexed Gait velocity: decr   General Gait Details: cues for sequence, posture and position from [employer]            Wheelchair Mobility    Modified Rankin (Stroke Patients Only)       Balance                                    Cognition Arousal/Alertness: Awake/alert Behavior During  Therapy: WFL for tasks assessed/performed Overall Cognitive Status: Within Functional Limits for tasks assessed                      Exercises      General Comments        Pertinent Vitals/Pain Pain Assessment: 0-10 Pain Score: 6  Pain Location: L knee Pain Descriptors / Indicators: Aching;Sore Pain Intervention(s): Limited activity within patient's tolerance;Monitored during session;Premedicated before session;Ice applied    Home Living Family/patient expects to be discharged to:: Private residence Living Arrangements: Spouse/significant other Available Help at Discharge: Family Type of Home: House Home Access: Stairs to enter Entrance Stairs-Rails: None Home Layout: Multi-level;Able to live on main level with bedroom/bathroom Home Equipment: None      Prior Function Level of Independence: Independent          PT Goals (current goals can now be found in the care plan section) Acute Rehab PT Goals Patient Stated Goal: decrease pain PT Goal Formulation: With patient Time For Goal Achievement: 08/07/15 Potential to Achieve Goals: Good Progress towards PT goals: Progressing toward goals    Frequency  7X/week    PT Plan Current plan remains appropriate    Co-evaluation             End of Session Equipment Utilized During Treatment: Gait belt;Left knee immobilizer Activity Tolerance: Patient tolerated treatment well Patient left: in bed;with call bell/phone within reach;with bed alarm  set;with family/visitor present     Time: [id number] PT Time Calculation (min) (ACUTE ONLY): 36 min  Charges:  $Gait Training: 23-37 mins                    G Codes:      Zamzam Whinery 08/07/15, 3:01 PM

## 2015-08-03 NOTE — Discharge Instructions (Addendum)
° °Dr. Frank Aluisio °Total Joint Specialist °[facility] °[address] °[place], [place] [address] °[phone] ° °TOTAL KNEE REPLACEMENT POSTOPERATIVE DIRECTIONS ° °Knee Rehabilitation, Guidelines Following Surgery  °Results after knee surgery are often greatly improved when you follow the exercise, range of motion and muscle strengthening exercises prescribed by your doctor. Safety measures are also important to protect the knee from further injury. Any time any of these exercises cause you to have increased pain or swelling in your knee joint, decrease the amount until you are comfortable again and slowly increase them. If you have problems or questions, call your caregiver or physical therapist for advice.  ° °HOME CARE INSTRUCTIONS  °Remove items at home which could result in a fall. This includes throw rugs or furniture in walking pathways.  °· ICE to the affected knee every three hours for 30 minutes at a time and then as needed for pain and swelling.  Continue to use ice on the knee for pain and swelling from surgery. You may notice swelling that will progress down to the foot and ankle.  This is normal after surgery.  Elevate the leg when you are not up walking on it.   °· Continue to use the breathing machine which will help keep your temperature down.  It is common for your temperature to cycle up and down following surgery, especially at night when you are not up moving around and exerting yourself.  The breathing machine keeps your lungs expanded and your temperature down. °· Do not place pillow under knee, focus on keeping the knee straight while resting ° °DIET °You may resume your previous home diet once your are discharged from the hospital. ° °DRESSING / WOUND CARE / SHOWERING °You may shower 3 days after surgery, but keep the wounds dry during showering.  You may use an occlusive plastic wrap (Press'n Seal for example), NO SOAKING/SUBMERGING IN THE BATHTUB.  If the  bandage gets wet, change with a clean dry gauze.  If the incision gets wet, pat the wound dry with a clean towel. °You may start showering once you are discharged home but do not submerge the incision under water. Just pat the incision dry and apply a dry gauze dressing on daily. °Change the surgical dressing daily and reapply a dry dressing each time. ° °ACTIVITY °Walk with your walker as instructed. °Use walker as long as suggested by your caregivers. °Avoid periods of inactivity such as sitting longer than an hour when not asleep. This helps prevent blood clots.  °You may resume a sexual relationship in one month or when given the OK by your doctor.  °You may return to work once you are cleared by your doctor.  °Do not drive a car for 6 weeks or until released by you surgeon.  °Do not drive while taking narcotics. ° °WEIGHT BEARING °Weight bearing as tolerated with assist device (walker, cane, etc) as directed, use it as long as suggested by your surgeon or therapist, typically at least 4-6 weeks. ° °POSTOPERATIVE CONSTIPATION PROTOCOL °Constipation - defined medically as fewer than three stools per week and severe constipation as less than one stool per week. ° °One of the most common issues patients have following surgery is constipation.  Even if you have a regular bowel pattern at home, your normal regimen is likely to be disrupted due to multiple reasons following surgery.  Combination of anesthesia, postoperative narcotics, change in appetite and fluid intake all can affect your bowels.    In order to avoid complications following surgery, here are some recommendations in order to help you during your recovery period. ° °Colace (docusate) - Pick up an over-the-counter form of Colace or another stool softener and take twice a day as long as you are requiring postoperative pain medications.  Take with a full glass of water daily.  If you experience loose stools or diarrhea, hold the colace until you stool forms  back up.  If your symptoms do not get better within 1 week or if they get worse, check with your doctor. ° °Dulcolax (bisacodyl) - Pick up over-the-counter and take as directed by the product packaging as needed to assist with the movement of your bowels.  Take with a full glass of water.  Use this product as needed if not relieved by Colace only.  ° °MiraLax (polyethylene glycol) - Pick up over-the-counter to have on hand.  MiraLax is a solution that will increase the amount of water in your bowels to assist with bowel movements.  Take as directed and can mix with a glass of water, juice, soda, coffee, or tea.  Take if you go more than two days without a movement. °Do not use MiraLax more than once per day. Call your doctor if you are still constipated or irregular after using this medication for 7 days in a row. ° °If you continue to have problems with postoperative constipation, please contact the office for further assistance and recommendations.  If you experience "the worst abdominal pain ever" or develop nausea or vomiting, please contact the office immediatly for further recommendations for treatment. ° °ITCHING ° If you experience itching with your medications, try taking only a single pain pill, or even half a pain pill at a time.  You can also use Benadryl over the counter for itching or also to help with sleep.  ° °TED HOSE STOCKINGS °Wear the elastic stockings on both legs for three weeks following surgery during the day but you may remove then at night for sleeping. ° °MEDICATIONS °See your medication summary on the “After Visit Summary” that the nursing staff will review with you prior to discharge.  You may have some home medications which will be placed on hold until you complete the course of blood thinner medication.  It is important for you to complete the blood thinner medication as prescribed by your surgeon.  Continue your approved medications as instructed at time of  discharge. ° °PRECAUTIONS °If you experience chest pain or shortness of breath - call 911 immediately for transfer to the hospital emergency department.  °If you develop a fever greater that 101 F, purulent drainage from wound, increased redness or drainage from wound, foul odor from the wound/dressing, or calf pain - CONTACT YOUR SURGEON.   °                                                °FOLLOW-UP APPOINTMENTS °Make sure you keep all of your appointments after your operation with your surgeon and caregivers. You should call the office at the above phone number and make an appointment for approximately two weeks after the date of your surgery or on the date instructed by your surgeon outlined in the "After Visit Summary". ° ° °RANGE OF MOTION AND STRENGTHENING EXERCISES  °Rehabilitation of the knee is important following a knee injury or   an operation. After just a few days of immobilization, the muscles of the thigh which control the knee become weakened and shrink (atrophy). Knee exercises are designed to build up the tone and strength of the thigh muscles and to improve knee motion. Often times heat used for twenty to thirty minutes before working out will loosen up your tissues and help with improving the range of motion but do not use heat for the first two weeks following surgery. These exercises can be done on a training (exercise) mat, on the floor, on a table or on a bed. Use what ever works the best and is most comfortable for you Knee exercises include:  °Leg Lifts - While your knee is still immobilized in a splint or cast, you can do straight leg raises. Lift the leg to 60 degrees, hold for 3 sec, and slowly lower the leg. Repeat 10-20 times 2-3 times daily. Perform this exercise against resistance later as your knee gets better.  °Quad and Hamstring Sets - Tighten up the muscle on the front of the thigh (Quad) and hold for 5-10 sec. Repeat this 10-20 times hourly. Hamstring sets are done by pushing the  foot backward against an object and holding for 5-10 sec. Repeat as with quad sets.  °· Leg Slides: Lying on your back, slowly slide your foot toward your buttocks, bending your knee up off the floor (only go as far as is comfortable). Then slowly slide your foot back down until your leg is flat on the floor again. °· Angel Wings: Lying on your back spread your legs to the side as far apart as you can without causing discomfort.  °A rehabilitation program following serious knee injuries can speed recovery and prevent re-injury in the future due to weakened muscles. Contact your doctor or a physical therapist for more information on knee rehabilitation.  ° °IF YOU ARE TRANSFERRED TO A SKILLED REHAB FACILITY °If the patient is transferred to a skilled rehab facility following release from the hospital, a list of the current medications will be sent to the facility for the patient to continue.  When discharged from the skilled rehab facility, please have the facility set up the patient's Home Health Physical Therapy prior to being released. Also, the skilled facility will be responsible for providing the patient with their medications at time of release from the facility to include their pain medication, the muscle relaxants, and their blood thinner medication. If the patient is still at the rehab facility at time of the two week follow up appointment, the skilled rehab facility will also need to assist the patient in arranging follow up appointment in our office and any transportation needs. ° °MAKE SURE YOU:  °Understand these instructions.  °Get help right away if you are not doing well or get worse.  ° ° °Pick up stool softner and laxative for home use following surgery while on pain medications. °Do not submerge incision under water. °Please use good hand washing techniques while changing dressing each day. °May shower starting three days after surgery. °Please use a clean towel to pat the incision dry following  showers. °Continue to use ice for pain and swelling after surgery. °Do not use any lotions or creams on the incision until instructed by your surgeon. ° °Take Xarelto for two and a half more weeks, then discontinue Xarelto. °Once the patient has completed the Xarelto, they may resume the 81 mg Aspirin. ° ° °Information on my medicine - XARELTO® (Rivaroxaban) ° °  This medication education was reviewed with me or my healthcare representative as part of my discharge preparation.  The pharmacist that spoke with me during my hospital stay was:  Angela Adam [facility]  Why was Xarelto prescribed for you? Xarelto was prescribed for you to reduce the risk of blood clots forming after orthopedic surgery. The medical term for these abnormal blood clots is venous thromboembolism (VTE).  What do you need to know about xarelto ? Take your Xarelto ONCE DAILY at the same time every day. You may take it either with or without food.  If you have difficulty swallowing the tablet whole, you may crush it and mix in applesauce just prior to taking your dose.  Take Xarelto exactly as prescribed by your doctor and DO NOT stop taking Xarelto without talking to the doctor who prescribed the medication.  Stopping without other VTE prevention medication to take the place of Xarelto may increase your risk of developing a clot.  After discharge, you should have regular check-up appointments with your healthcare provider that is prescribing your Xarelto.    What do you do if you miss a dose? If you miss a dose, take it as soon as you remember on the same day then continue your regularly scheduled once daily regimen the next day. Do not take two doses of Xarelto on the same day.   Important Safety Information A possible side effect of Xarelto is bleeding. You should call your healthcare provider right away if you experience any of the following: ? Bleeding from an injury or your nose that does not stop. ? Unusual  colored urine (red or dark brown) or unusual colored stools (red or black). ? Unusual bruising for unknown reasons. ? A serious fall or if you hit your head (even if there is no bleeding).  Some medicines may interact with Xarelto and might increase your risk of bleeding while on Xarelto. To help avoid this, consult your healthcare provider or pharmacist prior to using any new prescription or non-prescription medications, including herbals, vitamins, non-steroidal anti-inflammatory drugs (NSAIDs) and supplements.  This website has more information on Xarelto: [email or web address]

## 2015-08-04 DIAGNOSIS — M179 Osteoarthritis of knee, unspecified: Secondary | ICD-10-CM | POA: Diagnosis not present

## 2015-08-04 LAB — CBC
HCT: 32.6 % — ABNORMAL LOW (ref 36.0–46.0)
Hemoglobin: 10.4 g/dL — ABNORMAL LOW (ref 12.0–15.0)
MCH: 31.4 pg (ref 26.0–34.0)
MCHC: 31.9 g/dL (ref 30.0–36.0)
MCV: 98.5 fL (ref 78.0–100.0)
Platelets: 205 10*3/uL (ref 150–400)
RBC: 3.31 MIL/uL — ABNORMAL LOW (ref 3.87–5.11)
RDW: 13.1 % (ref 11.5–15.5)
WBC: 12.1 10*3/uL — ABNORMAL HIGH (ref 4.0–10.5)

## 2015-08-04 LAB — BASIC METABOLIC PANEL WITH GFR
Anion gap: 7 (ref 5–15)
BUN: 8 mg/dL (ref 6–20)
CO2: 26 mmol/L (ref 22–32)
Calcium: 8.5 mg/dL — ABNORMAL LOW (ref 8.9–10.3)
Chloride: 106 mmol/L (ref 101–111)
Creatinine, Ser: 0.58 mg/dL (ref 0.44–1.00)
GFR calc Af Amer: >60 mL/min
GFR calc non Af Amer: >60 mL/min
Glucose, Bld: 118 mg/dL — ABNORMAL HIGH (ref 65–99)
Potassium: 3.8 mmol/L (ref 3.5–5.1)
Sodium: 139 mmol/L (ref 135–145)

## 2015-08-04 MED ORDER — TRAMADOL HCL 50 MG PO TABS: 50.0000 mg | ORAL_TABLET | Freq: Four times a day (QID) | ORAL | Status: DC | PRN

## 2015-08-04 MED ORDER — RIVAROXABAN 10 MG PO TABS: 10.0000 mg | ORAL_TABLET | Freq: Every day | ORAL | Status: DC

## 2015-08-04 MED ORDER — ONDANSETRON HCL 4 MG PO TABS: 4.0000 mg | ORAL_TABLET | Freq: Four times a day (QID) | ORAL | Status: DC | PRN

## 2015-08-04 MED ORDER — OXYCODONE HCL 5 MG PO TABS: 5.0000 mg | ORAL_TABLET | ORAL | Status: DC | PRN

## 2015-08-04 MED ORDER — METHOCARBAMOL 500 MG PO TABS: 500.0000 mg | ORAL_TABLET | Freq: Four times a day (QID) | ORAL | Status: DC | PRN

## 2015-08-04 MED ORDER — OXYCODONE HCL 5 MG PO TABS
5.0000 mg | ORAL_TABLET | ORAL | Status: DC | PRN
  Administered 2015-08-04 (×3): 15 mg via ORAL
  Filled 2015-08-04 (×3): qty 3

## 2015-08-04 NOTE — Progress Notes (Signed)
Occupational Therapy Treatment- 2nd session this day Patient Details Name: [name] MRN: [id number] DOB: 07/14/1946 Today's Date: 08/04/2015    History of present illness L TKR   OT comments  Pt hoping to DC this day  Follow Up Recommendations  No OT follow up    Equipment Recommendations  3 in 1 bedside comode    Recommendations for Other Services      Precautions / Restrictions Precautions Precautions: Fall;Back Required Braces or Orthoses: Knee Immobilizer - Left       Mobility Bed Mobility Overal bed mobility: Needs Assistance Bed Mobility: Sit to Supine     Supine to sit: Supervision Sit to supine: Supervision   General bed mobility comments: Pt assisted to roll to sidely and move to sitting 2* back issues  Transfers Overall transfer level: Needs assistance Equipment used: Rolling walker (2 wheeled) Transfers: Sit to/from [facility] Sit to Stand: Supervision Stand pivot transfers: Supervision       General transfer comment: VC for safety        ADL       Grooming: Set up;Standing       Lower Body Bathing: Set up;Sit to/from stand   Upper Body Dressing : Set up;Sitting   Lower Body Dressing: Set up;Sit to/from stand   Toilet Transfer: Supervision/safety;RW;Regular Toilet;Cueing for sequencing   Toileting- Water quality scientist and Hygiene: Supervision/safety;Sit to/from stand;Cueing for sequencing;Cueing for safety   Tub/ Shower Transfer: Walk-in shower;Cueing for safety;Cueing for sequencing;Min guard   Functional mobility during ADLs: Supervision/safety;Cueing for safety;Cueing for sequencing General ADL Comments: pt much improved this day!                  Cognition   Behavior During Therapy: WFL for tasks assessed/performed Overall Cognitive Status: Within Functional Limits for tasks assessed                               General Comments      Pertinent Vitals/ Pain       Pain Score: 4   Pain Location: l knee Pain Descriptors / Indicators: Sore Pain Intervention(s): Monitored during session;Repositioned  Home Living Family/patient expects to be discharged to:: Private residence Living Arrangements: Spouse/significant other Available Help at Discharge: Family Type of Home: House Home Access: Stairs to enter Technical brewer of Steps: 3 Entrance Stairs-Rails: None Home Layout: Multi-level;Able to live on main level with bedroom/bathroom     Bathroom Shower/Tub: Occupational psychologist: Standard Bathroom Accessibility: Yes   Home Equipment: None          Prior Functioning/Environment Level of Independence: Independent            Frequency Min 2X/week     Progress Toward Goals  OT Goals(current goals can now be found in the care plan section)  Progress towards OT goals: Progressing toward goals     Plan Discharge plan remains appropriate       End of Session Equipment Utilized During Treatment: Rolling walker   Activity Tolerance Patient tolerated treatment well   Patient Left in bed;with call bell/phone within reach;with bed alarm set   Nurse Communication Mobility status        Time: [id number] OT Time Calculation (min): 22 min  Charges: OT General Charges $OT Visit: 1 Procedure OT Treatments $Self Care/Home Management : 8-22 mins  [name] D 08/04/2015, 11:20 AM

## 2015-08-04 NOTE — Progress Notes (Signed)
Patient escorted to car with NT via wheelchair.

## 2015-08-04 NOTE — Progress Notes (Signed)
   Subjective: 2 Days Post-Op Procedure(s) (LRB): LEFT TOTAL KNEE ARTHROPLASTY (Left) Patient reports pain as moderate.  Increased the pain medication Patient seen in rounds with Dr. Wynelle Link. Patient is having problems with pain in the knee, requiring pain medications Patient is ready to go home later today if improves.  Objective: Vital signs in last 24 hours: Temp:  [98.5 F (36.9 C)-99.6 F (37.6 C)] 99.6 F (37.6 C) (01/20 0517) Pulse Rate:  [84-108] 100 (01/20 0517) Resp:  [16] 16 (01/20 0517) BP: (101-138)/(61-69) 138/61 mmHg (01/20 0517) SpO2:  [97 %-100 %] 99 % (01/20 0517)  Intake/Output from previous day:  Intake/Output Summary (Last 24 hours) at 08/04/15 1141 Last data filed at 08/04/15 0900  Gross per 24 hour  Intake 990.83 ml  Output   3400 ml  Net -2409.17 ml    Intake/Output this shift: Total I/O In: 120 [P.O.:120] Out: 200 [Urine:200]  Labs:  Recent Labs  08/03/15 0430 08/04/15 0415  HGB 11.2* 10.4*    Recent Labs  08/03/15 0430 08/04/15 0415  WBC 11.4* 12.1*  RBC 3.52* 3.31*  HCT 34.2* 32.6*  PLT 208 205    Recent Labs  08/03/15 0430 08/04/15 0415  NA 141 139  K 3.9 3.8  CL 105 106  CO2 27 26  BUN 8 8  CREATININE 0.60 0.58  GLUCOSE 114* 118*  CALCIUM 8.7* 8.5*   No results for input(s): LABPT, INR in the last 72 hours.  EXAM: General - Patient is Alert, Appropriate and Oriented Extremity - Neurovascular intact Sensation intact distally Dorsiflexion/Plantar flexion intact Incision - clean, dry, no drainage Motor Function - intact, moving foot and toes well on exam.   Assessment/Plan: 2 Days Post-Op Procedure(s) (LRB): LEFT TOTAL KNEE ARTHROPLASTY (Left) Procedure(s) (LRB): LEFT TOTAL KNEE ARTHROPLASTY (Left) Past Medical History  Diagnosis Date  . Allergy   . Low back pain   . Lumbar herniated disc   . Fluid retention   . Cervical dysplasia   . Osteopenia 06/2015     T score -1.7  . Wears glasses   . Numbness  and tingling     feet bilat   . Cataract of both eyes   . Legally blind in left eye, as defined in [place]     since birth   . Syncope   . Urinary hesitancy   . Urinary frequency   . GERD (gastroesophageal reflux disease)   . Arthritis    Principal Problem:   OA (osteoarthritis) of knee  Estimated body mass index is 23.85 kg/(m^2) as calculated from the following:   Height as of this encounter: 5\' 4"  (1.626 m).   Weight as of this encounter: 63.05 kg (139 lb). Up with therapy Discharge home with home health Diet - Regular diet Follow up - in 2 weeks Activity - WBAT Disposition - Home Condition Upon Discharge - improving D/C Meds - See DC Summary DVT Prophylaxis - Xarelto  Arlee Muslim, PA-C Orthopaedic Surgery 08/04/2015, 11:41 AM

## 2015-08-04 NOTE — Progress Notes (Signed)
Physical Therapy Treatment Patient Details Name: [name] MRN: [id number] DOB: May 04, 1946 Today's Date: 08/04/2015    History of Present Illness L TKR    PT Comments    POD # 2 pm session. Pt stated she is feeling "much better".  Applied KI and assisted OOB to amb in hallway.  Practiced stairs with spouse.  Pt did much better.  Tolerated amb a greater distance in hallway.  Doing well.  Pt has met goals to D/C to home.  Reported to RN, pt ready for D/C today.   Follow Up Recommendations  Home health PT     Equipment Recommendations  Rolling walker with 5" wheels;3in1 (PT)    Recommendations for Other Services       Precautions / Restrictions Precautions Precautions: Fall;Back Precaution Comments: Pt rolling out of bed and using corset 2* back issues Required Braces or Orthoses: Knee Immobilizer - Left Knee Immobilizer - Left: Discontinue once straight leg raise with < 10 degree lag Restrictions Weight Bearing Restrictions: No Other Position/Activity Restrictions: WBAT    Mobility  Bed Mobility Overal bed mobility: Needs Assistance Bed Mobility: Supine to Sit     Supine to sit: Supervision     General bed mobility comments: Pt assisted to roll to sidely and move to sitting 2* back issues  Transfers Overall transfer level: Needs assistance Equipment used: Rolling walker (2 wheeled) Transfers: Sit to/from Stand Sit to Stand: Min guard;Supervision         General transfer comment: VC for safety plus increased time to c/o Nausea  Ambulation/Gait Ambulation/Gait assistance: Min guard;Min assist Ambulation Distance (Feet): 75 Feet Assistive device: Rolling walker (2 wheeled) Gait Pattern/deviations: Step-to pattern;Decreased stance time - left;Trunk flexed Gait velocity: decr   General Gait Details: cues for sequence, posture and position from [employer] Stairs: Yes Stairs assistance: Min assist Stair Management: No rails;Step to  pattern;Backwards;With walker Number of Stairs: 2 Only one VC needed for proper sequencing Did well Wheelchair Mobility    Modified Rankin (Stroke Patients Only)       Balance                                    Cognition Arousal/Alertness: Awake/alert Behavior During Therapy: WFL for tasks assessed/performed Overall Cognitive Status: Within Functional Limits for tasks assessed                      Exercises      General Comments        Pertinent Vitals/Pain Pain Assessment: 0-10 Pain Score: 8  Pain Location: L knee Pain Descriptors / Indicators: Sore;Grimacing;Tender Pain Intervention(s): Monitored during session;Premedicated before session;Repositioned;Ice applied    Home Living                      Prior Function            PT Goals (current goals can now be found in the care plan section) Progress towards PT goals: Progressing toward goals    Frequency  7X/week    PT Plan Current plan remains appropriate    Co-evaluation             End of Session Equipment Utilized During Treatment: Gait belt;Left knee immobilizer Activity Tolerance: Patient limited by fatigue;Other (comment) (dizziness)       Time: 1425-1450 PT Time Calculation (min) (ACUTE ONLY): 25 min  Charges:  $  Gait Training: 8-22 mins $Therapeutic Activity: 8-22 mins                    G Codes:      Rica Koyanagi  PTA [facility]  Acute  Rehab Pager      [phone]

## 2015-08-04 NOTE — Progress Notes (Signed)
Patient discharged.  Educated patient on discharge instructions, follow-up appointments, and discharge medications.  Prescriptions given to patient.  Educated patient on care of site, signs and symptoms of infection, and when to call a doctor.  IV previously removed.  Belongings gathered by husband.  Patient stated understanding and signed AVS.

## 2015-08-04 NOTE — Discharge Summary (Signed)
Physician Discharge Summary   Patient ID: [name] MRN: [id number] DOB/AGE: [birth year] 70 y.o.  Admit date: 08/02/2015 Discharge date: 08-04-2015  Primary Diagnosis:  Osteoarthritis Left knee(s)  Admission Diagnoses:  Past Medical History  Diagnosis Date  . Allergy   . Low back pain   . Lumbar herniated disc   . Fluid retention   . Cervical dysplasia   . Osteopenia 06/2015     T score -1.7  . Wears glasses   . Numbness and tingling     feet bilat   . Cataract of both eyes   . Legally blind in left eye, as defined in [place]     since birth   . Syncope   . Urinary hesitancy   . Urinary frequency   . GERD (gastroesophageal reflux disease)   . Arthritis    Discharge Diagnoses:   Principal Problem:   OA (osteoarthritis) of knee  Estimated body mass index is 23.85 kg/(m^2) as calculated from the following:   Height as of this encounter: '5\' 4"'$  (1.626 m).   Weight as of this encounter: 63.05 kg (139 lb).  Procedure:  Procedure(s) (LRB): LEFT TOTAL KNEE ARTHROPLASTY (Left)   Consults: None  HPI: [name] is a 70 y.o. year old female with end stage OA of her left knee with progressively worsening pain and dysfunction. She has constant pain, with activity and at rest and significant functional deficits with difficulties even with ADLs. She has had extensive non-op management including analgesics, injections of cortisone and viscosupplements, and home exercise program, but remains in significant pain with significant dysfunction. Radiographs show bone on bone arthritis medial and patellofemoral. She presents now for left Total Knee Arthroplasty.  Laboratory Data: Admission on 08/02/2015  Component Date Value Ref Range Status  . WBC 08/03/2015 11.4* 4.0 - 10.5 K/uL Final  . RBC 08/03/2015 3.52* 3.87 - 5.11 MIL/uL Final  . Hemoglobin 08/03/2015 11.2* 12.0 - 15.0 g/dL Final  . HCT 08/03/2015 34.2* 36.0 - 46.0 % Final  . MCV 08/03/2015 97.2  78.0 - 100.0 fL Final  .  MCH 08/03/2015 31.8  26.0 - 34.0 pg Final  . MCHC 08/03/2015 32.7  30.0 - 36.0 g/dL Final  . RDW 08/03/2015 12.8  11.5 - 15.5 % Final  . Platelets 08/03/2015 208  150 - 400 K/uL Final  . Sodium 08/03/2015 141  135 - 145 mmol/L Final  . Potassium 08/03/2015 3.9  3.5 - 5.1 mmol/L Final  . Chloride 08/03/2015 105  101 - 111 mmol/L Final  . CO2 08/03/2015 27  22 - 32 mmol/L Final  . Glucose, Bld 08/03/2015 114* 65 - 99 mg/dL Final  . BUN 08/03/2015 8  6 - 20 mg/dL Final  . Creatinine, Ser 08/03/2015 0.60  0.44 - 1.00 mg/dL Final  . Calcium 08/03/2015 8.7* 8.9 - 10.3 mg/dL Final  . GFR calc non Af Amer 08/03/2015 >60  >60 mL/min Final  . GFR calc Af Amer 08/03/2015 >60  >60 mL/min Final   Comment: (NOTE) The eGFR has been calculated using the CKD EPI equation. This calculation has not been validated in all clinical situations. eGFR's persistently <60 mL/min signify possible Chronic Kidney Disease.   . Anion gap 08/03/2015 9  5 - 15 Final  . WBC 08/04/2015 12.1* 4.0 - 10.5 K/uL Final  . RBC 08/04/2015 3.31* 3.87 - 5.11 MIL/uL Final  . Hemoglobin 08/04/2015 10.4* 12.0 - 15.0 g/dL Final  . HCT 08/04/2015 32.6* 36.0 - 46.0 % Final  .  MCV 08/04/2015 98.5  78.0 - 100.0 fL Final  . MCH 08/04/2015 31.4  26.0 - 34.0 pg Final  . MCHC 08/04/2015 31.9  30.0 - 36.0 g/dL Final  . RDW 08/04/2015 13.1  11.5 - 15.5 % Final  . Platelets 08/04/2015 205  150 - 400 K/uL Final  . Sodium 08/04/2015 139  135 - 145 mmol/L Final  . Potassium 08/04/2015 3.8  3.5 - 5.1 mmol/L Final  . Chloride 08/04/2015 106  101 - 111 mmol/L Final  . CO2 08/04/2015 26  22 - 32 mmol/L Final  . Glucose, Bld 08/04/2015 118* 65 - 99 mg/dL Final  . BUN 08/04/2015 8  6 - 20 mg/dL Final  . Creatinine, Ser 08/04/2015 0.58  0.44 - 1.00 mg/dL Final  . Calcium 08/04/2015 8.5* 8.9 - 10.3 mg/dL Final  . GFR calc non Af Amer 08/04/2015 >60  >60 mL/min Final  . GFR calc Af Amer 08/04/2015 >60  >60 mL/min Final   Comment: (NOTE) The eGFR  has been calculated using the CKD EPI equation. This calculation has not been validated in all clinical situations. eGFR's persistently <60 mL/min signify possible Chronic Kidney Disease.   Georgiann Hahn gap 08/04/2015 7  5 - 15 Final  Hospital Outpatient Visit on 07/25/2015  Component Date Value Ref Range Status  . MRSA, PCR 07/25/2015 NEGATIVE  NEGATIVE Final  . Staphylococcus aureus 07/25/2015 NEGATIVE  NEGATIVE Final   Comment:        The Xpert SA Assay (FDA approved for NASAL specimens in patients over 70 years of age), is one component of a comprehensive surveillance program.  Test performance has been validated by [facility] for patients greater than or equal to 52 year old. It is not intended to diagnose infection nor to guide or monitor treatment.   Marland Kitchen aPTT 07/25/2015 28  24 - 37 seconds Final  . WBC 07/25/2015 4.9  4.0 - 10.5 K/uL Final  . RBC 07/25/2015 4.36  3.87 - 5.11 MIL/uL Final  . Hemoglobin 07/25/2015 13.9  12.0 - 15.0 g/dL Final  . HCT 07/25/2015 42.5  36.0 - 46.0 % Final  . MCV 07/25/2015 97.5  78.0 - 100.0 fL Final  . MCH 07/25/2015 31.9  26.0 - 34.0 pg Final  . MCHC 07/25/2015 32.7  30.0 - 36.0 g/dL Final  . RDW 07/25/2015 12.6  11.5 - 15.5 % Final  . Platelets 07/25/2015 252  150 - 400 K/uL Final  . Sodium 07/25/2015 139  135 - 145 mmol/L Final  . Potassium 07/25/2015 4.2  3.5 - 5.1 mmol/L Final  . Chloride 07/25/2015 105  101 - 111 mmol/L Final  . CO2 07/25/2015 27  22 - 32 mmol/L Final  . Glucose, Bld 07/25/2015 100* 65 - 99 mg/dL Final  . BUN 07/25/2015 16  6 - 20 mg/dL Final  . Creatinine, Ser 07/25/2015 0.66  0.44 - 1.00 mg/dL Final  . Calcium 07/25/2015 8.9  8.9 - 10.3 mg/dL Final  . Total Protein 07/25/2015 7.3  6.5 - 8.1 g/dL Final  . Albumin 07/25/2015 4.1  3.5 - 5.0 g/dL Final  . AST 07/25/2015 25  15 - 41 U/L Final  . ALT 07/25/2015 20  14 - 54 U/L Final  . Alkaline Phosphatase 07/25/2015 81  38 - 126 U/L Final  . Total Bilirubin 07/25/2015  1.0  0.3 - 1.2 mg/dL Final  . GFR calc non Af Amer 07/25/2015 >60  >60 mL/min Final  . GFR calc Af Amer 07/25/2015 >60  >  60 mL/min Final   Comment: (NOTE) The eGFR has been calculated using the CKD EPI equation. This calculation has not been validated in all clinical situations. eGFR's persistently <60 mL/min signify possible Chronic Kidney Disease.   . Anion gap 07/25/2015 7  5 - 15 Final  . Prothrombin Time 07/25/2015 13.1  11.6 - 15.2 seconds Final  . INR 07/25/2015 0.97  0.00 - 1.49 Final  . ABO/RH(D) 07/25/2015 O POS   Final  . Antibody Screen 07/25/2015 NEG   Final  . Sample Expiration 07/25/2015 08/05/2015   Final  . Extend sample reason 07/25/2015 NO TRANSFUSIONS OR PREGNANCY IN THE PAST 3 MONTHS   Final  . Color, Urine 07/25/2015 YELLOW  YELLOW Final  . APPearance 07/25/2015 CLEAR  CLEAR Final  . Specific Gravity, Urine 07/25/2015 1.003* 1.005 - 1.030 Final  . pH 07/25/2015 6.5  5.0 - 8.0 Final  . Glucose, UA 07/25/2015 NEGATIVE  NEGATIVE mg/dL Final  . Hgb urine dipstick 07/25/2015 NEGATIVE  NEGATIVE Final  . Bilirubin Urine 07/25/2015 NEGATIVE  NEGATIVE Final  . Ketones, ur 07/25/2015 NEGATIVE  NEGATIVE mg/dL Final  . Protein, ur 07/25/2015 NEGATIVE  NEGATIVE mg/dL Final  . Nitrite 07/25/2015 NEGATIVE  NEGATIVE Final  . Leukocytes, UA 07/25/2015 NEGATIVE  NEGATIVE Final   MICROSCOPIC NOT DONE ON URINES WITH NEGATIVE PROTEIN, BLOOD, LEUKOCYTES, NITRITE, OR GLUCOSE <1000 mg/dL.  . ABO/RH(D) 07/25/2015 O POS   Final     X-Rays:No results found.  EKG: Orders placed or performed during the hospital encounter of 07/25/15  . EKG 12 lead  . EKG 12 lead     Hospital Course: [name] is a 70 y.o. who was admitted to [facility]. They were brought to the operating room on 08/02/2015 and underwent Procedure(s): LEFT TOTAL KNEE ARTHROPLASTY.  Patient tolerated the procedure well and was later transferred to the recovery room and then to the orthopaedic floor for  postoperative care.  They were given PO and IV analgesics for pain control following their surgery.  They were given 24 hours of postoperative antibiotics of  Anti-infectives    Start     Dose/Rate Route Frequency Ordered Stop   08/02/15 1830  ceFAZolin (ANCEF) IVPB 2 g/50 mL premix     2 g 100 mL/hr over 30 Minutes Intravenous Every 6 hours 08/02/15 1615 08/03/15 0057   08/02/15 1024  ceFAZolin (ANCEF) IVPB 2 g/50 mL premix     2 g 100 mL/hr over 30 Minutes Intravenous On call to O.R. 08/02/15 1024 08/02/15 1233     and started on DVT prophylaxis in the form of Xarelto.   PT and OT were ordered for total joint protocol.  Discharge planning consulted to help with postop disposition and equipment needs.  Patient had a tough night on the evening of surgery. Biggest complaint was nausea thru the night.Treated symptoms.  They started to get up OOB with therapy on day one. Hemovac drain was pulled without difficulty.  Continued to work with therapy into day two.  Dressing was changed on day two and the incision was healing well.  Increased the pain medications due to pain level.  Patient was seen in rounds and was ready to go home later that afternoon following therapy and pain control.  Discharge home with home health Diet - Regular diet Follow up - in 2 weeks Activity - WBAT Disposition - Home Condition Upon Discharge - improving D/C Meds - See DC Summary DVT Prophylaxis - Xarelto  Discharge  Instructions    Call MD / Call 911    Complete by:  As directed   If you experience chest pain or shortness of breath, CALL 911 and be transported to the hospital emergency room.  If you develope a fever above 101 F, pus (white drainage) or increased drainage or redness at the wound, or calf pain, call your surgeon's office.     Change dressing    Complete by:  As directed   Change dressing daily with sterile 4 x 4 inch gauze dressing and apply TED hose. Do not submerge the incision under water.      Constipation Prevention    Complete by:  As directed   Drink plenty of fluids.  Prune juice may be helpful.  You may use a stool softener, such as Colace (over the counter) 100 mg twice a day.  Use MiraLax (over the counter) for constipation as needed.     Diet general    Complete by:  As directed      Discharge instructions    Complete by:  As directed   Pick up stool softner and laxative for home use following surgery while on pain medications. Do not submerge incision under water. Please use good hand washing techniques while changing dressing each day. May shower starting three days after surgery. Please use a clean towel to pat the incision dry following showers. Continue to use ice for pain and swelling after surgery. Do not use any lotions or creams on the incision until instructed by your surgeon.  Take Xarelto for two and a half more weeks, then discontinue Xarelto. Once the patient has completed the Xarelto, they may resume the 81 mg Aspirin.  Postoperative Constipation Protocol  Constipation - defined medically as fewer than three stools per week and severe constipation as less than one stool per week.  One of the most common issues patients have following surgery is constipation.  Even if you have a regular bowel pattern at home, your normal regimen is likely to be disrupted due to multiple reasons following surgery.  Combination of anesthesia, postoperative narcotics, change in appetite and fluid intake all can affect your bowels.  In order to avoid complications following surgery, here are some recommendations in order to help you during your recovery period.  Colace (docusate) - Pick up an over-the-counter form of Colace or another stool softener and take twice a day as long as you are requiring postoperative pain medications.  Take with a full glass of water daily.  If you experience loose stools or diarrhea, hold the colace until you stool forms back up.  If your symptoms do  not get better within 1 week or if they get worse, check with your doctor.  Dulcolax (bisacodyl) - Pick up over-the-counter and take as directed by the product packaging as needed to assist with the movement of your bowels.  Take with a full glass of water.  Use this product as needed if not relieved by Colace only.   MiraLax (polyethylene glycol) - Pick up over-the-counter to have on hand.  MiraLax is a solution that will increase the amount of water in your bowels to assist with bowel movements.  Take as directed and can mix with a glass of water, juice, soda, coffee, or tea.  Take if you go more than two days without a movement. Do not use MiraLax more than once per day. Call your doctor if you are still constipated or irregular after using this medication  for 7 days in a row.  If you continue to have problems with postoperative constipation, please contact the office for further assistance and recommendations.  If you experience "the worst abdominal pain ever" or develop nausea or vomiting, please contact the office immediatly for further recommendations for treatment.     Do not put a pillow under the knee. Place it under the heel.    Complete by:  As directed      Do not sit on low chairs, stoools or toilet seats, as it may be difficult to get up from low surfaces    Complete by:  As directed      Driving restrictions    Complete by:  As directed   No driving until released by the physician.     Increase activity slowly as tolerated    Complete by:  As directed      Lifting restrictions    Complete by:  As directed   No lifting until released by the physician.     Patient may shower    Complete by:  As directed   You may shower without a dressing once there is no drainage.  Do not wash over the wound.  If drainage remains, do not shower until drainage stops.     TED hose    Complete by:  As directed   Use stockings (TED hose) for 3 weeks on both leg(s).  You may remove them at night  for sleeping.     Weight bearing as tolerated    Complete by:  As directed   Laterality:  left  Extremity:  Lower            Medication List    STOP taking these medications        aspirin 81 MG tablet     CALCIUM + D PO     cholecalciferol 1000 units tablet  Commonly known as:  VITAMIN D     estradiol 1 MG tablet  Commonly known as:  ESTRACE     fish oil-omega-3 fatty acids 1000 MG capsule     HYDROcodone-acetaminophen 10-650 MG tablet  Commonly known as:  LORCET     multivitamin tablet     Red Yeast Rice 600 MG Caps      TAKE these medications        ALPRAZolam 0.25 MG tablet  Commonly known as:  XANAX  Take 0.25 mg by mouth at bedtime as needed for sleep.     methocarbamol 500 MG tablet  Commonly known as:  ROBAXIN  Take 1 tablet (500 mg total) by mouth every 6 (six) hours as needed for muscle spasms.     oxyCODONE 5 MG immediate release tablet  Commonly known as:  Oxy IR/ROXICODONE  Take 1-3 tablets (5-15 mg total) by mouth every 3 (three) hours as needed for moderate pain or severe pain.     pantoprazole 40 MG tablet  Commonly known as:  PROTONIX  Take 40 mg by mouth daily.     potassium chloride SA 20 MEQ tablet  Commonly known as:  K-DUR,KLOR-CON  Take 20 mEq by mouth every Monday, Wednesday, and Friday.     rivaroxaban 10 MG Tabs tablet  Commonly known as:  XARELTO  Take 1 tablet (10 mg total) by mouth daily with breakfast. Take Xarelto for two and a half more weeks, then discontinue Xarelto. Once the patient has completed the Xarelto, they may resume the 81 mg Aspirin.  STOOL SOFTENER PO  Take 1-2 tablets by mouth 2 (two) times daily. Take 1 tablet in the morning and 2 tablet at night     temazepam 15 MG capsule  Commonly known as:  RESTORIL  Take 15 mg by mouth at bedtime as needed for sleep.     traMADol 50 MG tablet  Commonly known as:  ULTRAM  Take 1-2 tablets (50-100 mg total) by mouth every 6 (six) hours as needed (mild pain).             Follow-up Information    Follow up with [facility].   Why:  rolling walker and 3n1   Contact information:   [place] [address] [phone]       Follow up with [facility].   Why:  home health physical therapy   Contact information:   [address] [place]  [address] [phone]       Follow up with Gearlean Alf, MD. Schedule an appointment as soon as possible for a visit on 08/15/2015.   Specialty:  Orthopedic Surgery   Why:  Call office at [phone] to setup appointment on Tuesday 08/15/2015 with Dr. Wynelle Link.   Contact information:   [address] [place] [address] [phone]       Signed: Arlee Muslim, PA-C Orthopaedic Surgery 08/04/2015, 11:48 AM

## 2015-08-04 NOTE — Progress Notes (Signed)
Occupational Therapy Treatment Patient Details Name: RILLA EDMAN MRN: [id number] DOB: [birth year] Today's Date: 08/04/2015    History of present illness L TKR   OT comments  Pt improved this day!  Follow Up Recommendations  No OT follow up    Equipment Recommendations  3 in 1 bedside comode       Precautions / Restrictions Precautions Precautions: Fall;Back Required Braces or Orthoses: Knee Immobilizer - Left       Mobility Bed Mobility Overal bed mobility: Needs Assistance Bed Mobility: Supine to Sit;Sit to Supine     Supine to sit: Supervision Sit to supine: Supervision   General bed mobility comments: Pt assisted to roll to sidely and move to sitting 2* back issues  Transfers Overall transfer level: Needs assistance Equipment used: Rolling walker (2 wheeled) Transfers: Sit to/from Stand Sit to Stand: Supervision Stand pivot transfers: Supervision       General transfer comment: VC for safety        ADL       Grooming: Set up;Standing       Lower Body Bathing: Set up;Sit to/from stand   Upper Body Dressing : Set up;Sitting   Lower Body Dressing: Set up;Sit to/from stand   Toilet Transfer: Min guard;RW   Toileting- Water quality scientist and Hygiene: Supervision/safety;Sit to/from stand       Functional mobility during ADLs: Min guard General ADL Comments: pt much improved this day!                  Cognition   Behavior During Therapy: WFL for tasks assessed/performed Overall Cognitive Status: Within Functional Limits for tasks assessed                                    Pertinent Vitals/ Pain          Home Living Family/patient expects to be discharged to:: Private residence Living Arrangements: Spouse/significant other Available Help at Discharge: Family Type of Home: House Home Access: Stairs to enter Technical brewer of Steps: 3 Entrance Stairs-Rails: None Home Layout: Multi-level;Able to live on  main level with bedroom/bathroom     Bathroom Shower/Tub: Occupational psychologist: Standard Bathroom Accessibility: Yes   Home Equipment: None          Prior Functioning/Environment Level of Independence: Independent            Frequency Min 2X/week     Progress Toward Goals  OT Goals(current goals can now be found in the care plan section)  Progress towards OT goals: Progressing toward goals     Plan Discharge plan remains appropriate       End of Session Equipment Utilized During Treatment: Rolling walker;Back brace (pt has personal back brace)   Activity Tolerance Patient limited by pain   Patient Left in bed;with call bell/phone within reach;with nursing/sitter in room   Nurse Communication Mobility status;Patient requests pain meds        Time: [id number] OT Time Calculation (min): 13 min  Charges: OT General Charges $OT Visit: 1 Procedure OT Treatments $Self Care/Home Management : 8-22 mins  Emmalina Espericueta, Thereasa Parkin 08/04/2015, 11:16 AM

## 2015-08-04 NOTE — Progress Notes (Signed)
Physical Therapy Treatment Patient Details Name: [name] MRN: [id number] DOB: May 07, 1946 Today's Date: 08/04/2015    History of Present Illness L TKR    PT Comments    POD # 2 am session.  Applied KI as pt was unable to perform active SLR.  Assisted OOB to amb a limited distance in hallway to the portable stairs.  Pt was able to get 2 steps backward with walker but then c/o max dizziness and started to fall forward.  Another therapist called to help pt down to a recliner.  Pt will need another therapy session later today to achieve D/C goals.    Follow Up Recommendations  Home health PT     Equipment Recommendations  Rolling walker with 5" wheels;3in1 (PT)    Recommendations for Other Services       Precautions / Restrictions Precautions Precautions: Fall;Back Precaution Comments: Pt rolling out of bed and using corset 2* back issues Required Braces or Orthoses: Knee Immobilizer - Left Knee Immobilizer - Left: Discontinue once straight leg raise with < 10 degree lag Restrictions Weight Bearing Restrictions: No Other Position/Activity Restrictions: WBAT    Mobility  Bed Mobility Overal bed mobility: Needs Assistance Bed Mobility: Supine to Sit     Supine to sit: Supervision Sit to supine: Supervision   General bed mobility comments: Pt assisted to roll to sidely and move to sitting 2* back issues  Transfers Overall transfer level: Needs assistance Equipment used: Rolling walker (2 wheeled) Transfers: Sit to/from Stand Sit to Stand: Min guard;Supervision Stand pivot transfers: Supervision       General transfer comment: VC for safety plus increased time to c/o Nausea  Ambulation/Gait Ambulation/Gait assistance: Min guard;Min assist Ambulation Distance (Feet): 11 Feet Assistive device: Rolling walker (2 wheeled) Gait Pattern/deviations: Step-to pattern;Decreased stance time - left;Trunk flexed Gait velocity: decr   General Gait Details: cues for  sequence, posture and position from [employer] Stairs: Yes Stairs assistance: Min assist Stair Management: No rails;Step to pattern;Backwards;With walker Number of Stairs: 2 General stair comments: with spouse practiced 2 steps but when she got to the top she felt "very dizzy".  Started to lean far forward.  Another therapist called to assist down the 2 steps to a recliner brought to her.  BP 148/60.  Reported to RN  Wheelchair Mobility    Modified Rankin (Stroke Patients Only)       Balance                                    Cognition Arousal/Alertness: Awake/alert Behavior During Therapy: WFL for tasks assessed/performed Overall Cognitive Status: Within Functional Limits for tasks assessed                      Exercises      General Comments        Pertinent Vitals/Pain Pain Assessment: 0-10 Pain Score: 8  Pain Location: L knee Pain Descriptors / Indicators: Sore;Grimacing;Tender Pain Intervention(s): Monitored during session;Premedicated before session;Repositioned;Ice applied    Home Living                      Prior Function            PT Goals (current goals can now be found in the care plan section) Progress towards PT goals: Progressing toward goals    Frequency  7X/week  PT Plan Current plan remains appropriate    Co-evaluation             End of Session Equipment Utilized During Treatment: Gait belt;Left knee immobilizer Activity Tolerance: Patient limited by fatigue;Other (comment) (dizziness)       Time: [id number] PT Time Calculation (min) (ACUTE ONLY): 24 min  Charges:  $Gait Training: 8-22 mins $Therapeutic Activity: 8-22 mins                    G Codes:      Rica Koyanagi  PTA [facility]  Acute  Rehab Pager      [phone]

## 2015-08-06 DIAGNOSIS — Z7901 Long term (current) use of anticoagulants: Secondary | ICD-10-CM | POA: Diagnosis not present

## 2015-08-06 DIAGNOSIS — E785 Hyperlipidemia, unspecified: Secondary | ICD-10-CM | POA: Diagnosis not present

## 2015-08-06 DIAGNOSIS — Z96652 Presence of left artificial knee joint: Secondary | ICD-10-CM | POA: Diagnosis not present

## 2015-08-06 DIAGNOSIS — Z471 Aftercare following joint replacement surgery: Secondary | ICD-10-CM | POA: Diagnosis not present

## 2015-08-06 DIAGNOSIS — M5413 Radiculopathy, cervicothoracic region: Secondary | ICD-10-CM | POA: Diagnosis not present

## 2015-08-06 DIAGNOSIS — Z87891 Personal history of nicotine dependence: Secondary | ICD-10-CM | POA: Diagnosis not present

## 2015-08-06 DIAGNOSIS — K219 Gastro-esophageal reflux disease without esophagitis: Secondary | ICD-10-CM | POA: Diagnosis not present

## 2015-08-06 DIAGNOSIS — F419 Anxiety disorder, unspecified: Secondary | ICD-10-CM | POA: Diagnosis not present

## 2015-08-11 DIAGNOSIS — R300 Dysuria: Secondary | ICD-10-CM | POA: Diagnosis not present

## 2015-08-16 DIAGNOSIS — Z96652 Presence of left artificial knee joint: Secondary | ICD-10-CM | POA: Diagnosis not present

## 2015-08-16 DIAGNOSIS — Z87891 Personal history of nicotine dependence: Secondary | ICD-10-CM | POA: Diagnosis not present

## 2015-08-16 DIAGNOSIS — E785 Hyperlipidemia, unspecified: Secondary | ICD-10-CM | POA: Diagnosis not present

## 2015-08-16 DIAGNOSIS — F419 Anxiety disorder, unspecified: Secondary | ICD-10-CM | POA: Diagnosis not present

## 2015-08-16 DIAGNOSIS — K219 Gastro-esophageal reflux disease without esophagitis: Secondary | ICD-10-CM | POA: Diagnosis not present

## 2015-08-16 DIAGNOSIS — M5413 Radiculopathy, cervicothoracic region: Secondary | ICD-10-CM | POA: Diagnosis not present

## 2015-08-16 DIAGNOSIS — Z471 Aftercare following joint replacement surgery: Secondary | ICD-10-CM | POA: Diagnosis not present

## 2015-08-16 DIAGNOSIS — Z7901 Long term (current) use of anticoagulants: Secondary | ICD-10-CM | POA: Diagnosis not present

## 2015-08-18 DIAGNOSIS — E785 Hyperlipidemia, unspecified: Secondary | ICD-10-CM | POA: Diagnosis not present

## 2015-08-18 DIAGNOSIS — Z471 Aftercare following joint replacement surgery: Secondary | ICD-10-CM | POA: Diagnosis not present

## 2015-08-18 DIAGNOSIS — Z96652 Presence of left artificial knee joint: Secondary | ICD-10-CM | POA: Diagnosis not present

## 2015-08-18 DIAGNOSIS — Z7901 Long term (current) use of anticoagulants: Secondary | ICD-10-CM | POA: Diagnosis not present

## 2015-08-18 DIAGNOSIS — F419 Anxiety disorder, unspecified: Secondary | ICD-10-CM | POA: Diagnosis not present

## 2015-08-18 DIAGNOSIS — K219 Gastro-esophageal reflux disease without esophagitis: Secondary | ICD-10-CM | POA: Diagnosis not present

## 2015-08-18 DIAGNOSIS — Z87891 Personal history of nicotine dependence: Secondary | ICD-10-CM | POA: Diagnosis not present

## 2015-08-18 DIAGNOSIS — M5413 Radiculopathy, cervicothoracic region: Secondary | ICD-10-CM | POA: Diagnosis not present

## 2015-08-21 DIAGNOSIS — M25562 Pain in left knee: Secondary | ICD-10-CM | POA: Diagnosis not present

## 2015-08-23 DIAGNOSIS — M25562 Pain in left knee: Secondary | ICD-10-CM | POA: Diagnosis not present

## 2015-08-25 DIAGNOSIS — M25562 Pain in left knee: Secondary | ICD-10-CM | POA: Diagnosis not present

## 2015-08-28 DIAGNOSIS — M25562 Pain in left knee: Secondary | ICD-10-CM | POA: Diagnosis not present

## 2015-08-30 DIAGNOSIS — M25562 Pain in left knee: Secondary | ICD-10-CM | POA: Diagnosis not present

## 2015-09-01 DIAGNOSIS — M25562 Pain in left knee: Secondary | ICD-10-CM | POA: Diagnosis not present

## 2015-09-04 DIAGNOSIS — M25562 Pain in left knee: Secondary | ICD-10-CM | POA: Diagnosis not present

## 2015-09-05 DIAGNOSIS — Z471 Aftercare following joint replacement surgery: Secondary | ICD-10-CM | POA: Diagnosis not present

## 2015-09-05 DIAGNOSIS — Z96652 Presence of left artificial knee joint: Secondary | ICD-10-CM | POA: Diagnosis not present

## 2015-09-06 DIAGNOSIS — M25562 Pain in left knee: Secondary | ICD-10-CM | POA: Diagnosis not present

## 2015-09-08 DIAGNOSIS — M25562 Pain in left knee: Secondary | ICD-10-CM | POA: Diagnosis not present

## 2015-10-03 DIAGNOSIS — K219 Gastro-esophageal reflux disease without esophagitis: Secondary | ICD-10-CM | POA: Diagnosis not present

## 2015-10-03 DIAGNOSIS — D649 Anemia, unspecified: Secondary | ICD-10-CM | POA: Diagnosis not present

## 2015-10-03 DIAGNOSIS — R300 Dysuria: Secondary | ICD-10-CM | POA: Diagnosis not present

## 2015-10-03 DIAGNOSIS — E785 Hyperlipidemia, unspecified: Secondary | ICD-10-CM | POA: Diagnosis not present

## 2015-10-03 DIAGNOSIS — G479 Sleep disorder, unspecified: Secondary | ICD-10-CM | POA: Diagnosis not present

## 2015-10-03 DIAGNOSIS — M1990 Unspecified osteoarthritis, unspecified site: Secondary | ICD-10-CM | POA: Diagnosis not present

## 2015-10-03 DIAGNOSIS — F419 Anxiety disorder, unspecified: Secondary | ICD-10-CM | POA: Diagnosis not present

## 2015-10-03 DIAGNOSIS — R002 Palpitations: Secondary | ICD-10-CM | POA: Diagnosis not present

## 2015-12-05 DIAGNOSIS — H2511 Age-related nuclear cataract, right eye: Secondary | ICD-10-CM | POA: Diagnosis not present

## 2015-12-05 DIAGNOSIS — H18412 Arcus senilis, left eye: Secondary | ICD-10-CM | POA: Diagnosis not present

## 2015-12-05 DIAGNOSIS — H18411 Arcus senilis, right eye: Secondary | ICD-10-CM | POA: Diagnosis not present

## 2015-12-05 DIAGNOSIS — H2512 Age-related nuclear cataract, left eye: Secondary | ICD-10-CM | POA: Diagnosis not present

## 2016-02-21 DIAGNOSIS — M1612 Unilateral primary osteoarthritis, left hip: Secondary | ICD-10-CM | POA: Diagnosis not present

## 2016-02-21 DIAGNOSIS — M160 Bilateral primary osteoarthritis of hip: Secondary | ICD-10-CM | POA: Diagnosis not present

## 2016-02-21 DIAGNOSIS — M1611 Unilateral primary osteoarthritis, right hip: Secondary | ICD-10-CM | POA: Diagnosis not present

## 2016-03-12 DIAGNOSIS — L570 Actinic keratosis: Secondary | ICD-10-CM | POA: Diagnosis not present

## 2016-03-14 DIAGNOSIS — M1612 Unilateral primary osteoarthritis, left hip: Secondary | ICD-10-CM | POA: Diagnosis not present

## 2016-04-04 DIAGNOSIS — M1990 Unspecified osteoarthritis, unspecified site: Secondary | ICD-10-CM | POA: Diagnosis not present

## 2016-04-04 DIAGNOSIS — G479 Sleep disorder, unspecified: Secondary | ICD-10-CM | POA: Diagnosis not present

## 2016-04-04 DIAGNOSIS — K219 Gastro-esophageal reflux disease without esophagitis: Secondary | ICD-10-CM | POA: Diagnosis not present

## 2016-04-04 DIAGNOSIS — F419 Anxiety disorder, unspecified: Secondary | ICD-10-CM | POA: Diagnosis not present

## 2016-04-04 DIAGNOSIS — Z23 Encounter for immunization: Secondary | ICD-10-CM | POA: Diagnosis not present

## 2016-04-04 DIAGNOSIS — R002 Palpitations: Secondary | ICD-10-CM | POA: Diagnosis not present

## 2016-04-04 DIAGNOSIS — E785 Hyperlipidemia, unspecified: Secondary | ICD-10-CM | POA: Diagnosis not present

## 2016-04-12 DIAGNOSIS — M160 Bilateral primary osteoarthritis of hip: Secondary | ICD-10-CM | POA: Diagnosis not present

## 2016-04-23 DIAGNOSIS — D2239 Melanocytic nevi of other parts of face: Secondary | ICD-10-CM | POA: Diagnosis not present

## 2016-04-23 DIAGNOSIS — L570 Actinic keratosis: Secondary | ICD-10-CM | POA: Diagnosis not present

## 2016-04-23 DIAGNOSIS — D485 Neoplasm of uncertain behavior of skin: Secondary | ICD-10-CM | POA: Diagnosis not present

## 2016-04-23 DIAGNOSIS — Z23 Encounter for immunization: Secondary | ICD-10-CM | POA: Diagnosis not present

## 2016-04-23 DIAGNOSIS — L0102 Bockhart's impetigo: Secondary | ICD-10-CM | POA: Diagnosis not present

## 2016-06-17 DIAGNOSIS — H25813 Combined forms of age-related cataract, bilateral: Secondary | ICD-10-CM | POA: Diagnosis not present

## 2016-06-17 DIAGNOSIS — H53002 Unspecified amblyopia, left eye: Secondary | ICD-10-CM | POA: Diagnosis not present

## 2016-07-10 ENCOUNTER — Encounter: Payer: Medicare Other | Admitting: Gynecology

## 2016-07-18 DIAGNOSIS — Z1231 Encounter for screening mammogram for malignant neoplasm of breast: Secondary | ICD-10-CM | POA: Diagnosis not present

## 2016-08-15 DIAGNOSIS — M1612 Unilateral primary osteoarthritis, left hip: Secondary | ICD-10-CM | POA: Diagnosis not present

## 2016-08-27 ENCOUNTER — Ambulatory Visit (INDEPENDENT_AMBULATORY_CARE_PROVIDER_SITE_OTHER): Payer: PPO | Admitting: Gynecology

## 2016-08-27 ENCOUNTER — Encounter: Payer: Self-pay | Admitting: Gynecology

## 2016-08-27 VITALS — BP 120/76 | Ht 64.0 in | Wt 135.0 lb

## 2016-08-27 DIAGNOSIS — Z79890 Hormone replacement therapy (postmenopausal): Secondary | ICD-10-CM

## 2016-08-27 DIAGNOSIS — M8580 Other specified disorders of bone density and structure, unspecified site: Secondary | ICD-10-CM

## 2016-08-27 DIAGNOSIS — Z01411 Encounter for gynecological examination (general) (routine) with abnormal findings: Secondary | ICD-10-CM

## 2016-08-27 DIAGNOSIS — N952 Postmenopausal atrophic vaginitis: Secondary | ICD-10-CM

## 2016-08-27 MED ORDER — ESTRADIOL 0.5 MG PO TABS: 0.5000 mg | ORAL_TABLET | Freq: Every day | ORAL | 4 refills | Status: DC

## 2016-08-27 NOTE — Patient Instructions (Signed)

## 2016-08-27 NOTE — Progress Notes (Signed)
    [name] 10/11/1945 [id number]        71 y.o.  G1P1 for breast and pelvic exam.  Past medical history,surgical history, problem list, medications, allergies, family history and social history were all reviewed and documented as reviewed in the EPIC chart.  ROS:  Performed with pertinent positives and negatives included in the history, assessment and plan.   Additional significant findings :  None   Exam: [name] assistant Vitals:   08/27/16 0933  BP: 120/76  Weight: 135 lb (61.2 kg)  Height: 5\' 4"  (1.626 m)   Body mass index is 23.17 kg/m.  General appearance:  Normal affect, orientation and appearance. Skin: Grossly normal HEENT: Without gross lesions.  No cervical or supraclavicular adenopathy. Thyroid normal.  Lungs:  Clear without wheezing, rales or rhonchi Cardiac: RR, without RMG Abdominal:  Soft, nontender, without masses, guarding, rebound, organomegaly or hernia Breasts:  Examined lying and sitting without masses, retractions, discharge or axillary adenopathy. Pelvic:  Ext, BUS, Vagina with atrophic changes  Adnexa without masses or tenderness    Anus and perineum normal   Rectovaginal normal sphincter tone without palpated masses or tenderness.    Assessment/Plan:  71 y.o. G1P1 female for breast and pelvic exam.   1. Postmenopausal/atrophic genital changes/HRT. Had been on Estrace 1 mg daily. Is now on 0.5 mg daily. Did note increased hot flushes but acceptable. Does not want to stop HRT altogether. I again reviewed the risks/benefits to include the most recent NAMS 2017 guidelines. Benefits as far as symptom relief and possible bone/perivascular when started early versus risks to include thrombosis such as stroke heart attack DVT. Issues of breast cancer with HRT also discussed. After a lengthy discussion the patient wants to continue on the 0.5 and I refilled her 1 year. 2. Osteopenia. DEXA 06/2015 T score -1.7. Overall stable from prior DEXA.  Patients going to have a vitamin D level checked at her primary physician's office in March when she sees him. Plan on repeat DEXA next year to year interval. 3. Colonoscopy 2015. Repeat at their recommended interval. 4. Pap smear 2014. No Pap smear done today. No history of abnormal Pap smears. Reviewed current screening guidelines we both agree to stop screening based on age and hysterectomy history. 5. Mammography 06/2016. Continue with annual mammography when due. SBE monthly reviewed. 6. Health maintenance. No routine lab work done as patient does this elsewhere. Follow up 1 year, sooner as needed.   Anastasio Auerbach MD, 10:07 AM 08/27/2016

## 2016-10-02 DIAGNOSIS — F419 Anxiety disorder, unspecified: Secondary | ICD-10-CM | POA: Diagnosis not present

## 2016-10-02 DIAGNOSIS — L989 Disorder of the skin and subcutaneous tissue, unspecified: Secondary | ICD-10-CM | POA: Diagnosis not present

## 2016-10-02 DIAGNOSIS — M1990 Unspecified osteoarthritis, unspecified site: Secondary | ICD-10-CM | POA: Diagnosis not present

## 2016-10-02 DIAGNOSIS — K219 Gastro-esophageal reflux disease without esophagitis: Secondary | ICD-10-CM | POA: Diagnosis not present

## 2016-10-02 DIAGNOSIS — R002 Palpitations: Secondary | ICD-10-CM | POA: Diagnosis not present

## 2016-10-02 DIAGNOSIS — E7800 Pure hypercholesterolemia, unspecified: Secondary | ICD-10-CM | POA: Diagnosis not present

## 2016-10-02 DIAGNOSIS — G479 Sleep disorder, unspecified: Secondary | ICD-10-CM | POA: Diagnosis not present

## 2016-10-03 DIAGNOSIS — H16223 Keratoconjunctivitis sicca, not specified as Sjogren's, bilateral: Secondary | ICD-10-CM | POA: Diagnosis not present

## 2016-10-09 DIAGNOSIS — D485 Neoplasm of uncertain behavior of skin: Secondary | ICD-10-CM | POA: Diagnosis not present

## 2016-10-09 DIAGNOSIS — B078 Other viral warts: Secondary | ICD-10-CM | POA: Diagnosis not present

## 2016-10-09 DIAGNOSIS — D225 Melanocytic nevi of trunk: Secondary | ICD-10-CM | POA: Diagnosis not present

## 2016-10-09 DIAGNOSIS — L820 Inflamed seborrheic keratosis: Secondary | ICD-10-CM | POA: Diagnosis not present

## 2016-10-09 DIAGNOSIS — L821 Other seborrheic keratosis: Secondary | ICD-10-CM | POA: Diagnosis not present

## 2016-12-12 DIAGNOSIS — M1612 Unilateral primary osteoarthritis, left hip: Secondary | ICD-10-CM | POA: Diagnosis not present

## 2016-12-18 DIAGNOSIS — B078 Other viral warts: Secondary | ICD-10-CM | POA: Diagnosis not present

## 2016-12-18 DIAGNOSIS — D2239 Melanocytic nevi of other parts of face: Secondary | ICD-10-CM | POA: Diagnosis not present

## 2017-04-01 DIAGNOSIS — B078 Other viral warts: Secondary | ICD-10-CM | POA: Diagnosis not present

## 2017-04-01 DIAGNOSIS — L821 Other seborrheic keratosis: Secondary | ICD-10-CM | POA: Diagnosis not present

## 2017-04-01 DIAGNOSIS — D2239 Melanocytic nevi of other parts of face: Secondary | ICD-10-CM | POA: Diagnosis not present

## 2017-04-04 DIAGNOSIS — F419 Anxiety disorder, unspecified: Secondary | ICD-10-CM | POA: Diagnosis not present

## 2017-04-04 DIAGNOSIS — Z1389 Encounter for screening for other disorder: Secondary | ICD-10-CM | POA: Diagnosis not present

## 2017-04-04 DIAGNOSIS — F5101 Primary insomnia: Secondary | ICD-10-CM | POA: Diagnosis not present

## 2017-04-04 DIAGNOSIS — K219 Gastro-esophageal reflux disease without esophagitis: Secondary | ICD-10-CM | POA: Diagnosis not present

## 2017-04-04 DIAGNOSIS — R002 Palpitations: Secondary | ICD-10-CM | POA: Diagnosis not present

## 2017-04-04 DIAGNOSIS — E7800 Pure hypercholesterolemia, unspecified: Secondary | ICD-10-CM | POA: Diagnosis not present

## 2017-04-04 DIAGNOSIS — Z23 Encounter for immunization: Secondary | ICD-10-CM | POA: Diagnosis not present

## 2017-04-04 DIAGNOSIS — N951 Menopausal and female climacteric states: Secondary | ICD-10-CM | POA: Diagnosis not present

## 2017-06-04 DIAGNOSIS — M160 Bilateral primary osteoarthritis of hip: Secondary | ICD-10-CM | POA: Diagnosis not present

## 2017-06-04 DIAGNOSIS — M1612 Unilateral primary osteoarthritis, left hip: Secondary | ICD-10-CM | POA: Diagnosis not present

## 2017-06-04 DIAGNOSIS — M1611 Unilateral primary osteoarthritis, right hip: Secondary | ICD-10-CM | POA: Diagnosis not present

## 2017-06-18 DIAGNOSIS — M1612 Unilateral primary osteoarthritis, left hip: Secondary | ICD-10-CM | POA: Diagnosis not present

## 2017-07-21 DIAGNOSIS — Z1231 Encounter for screening mammogram for malignant neoplasm of breast: Secondary | ICD-10-CM | POA: Diagnosis not present

## 2017-07-21 DIAGNOSIS — M8589 Other specified disorders of bone density and structure, multiple sites: Secondary | ICD-10-CM | POA: Diagnosis not present

## 2017-08-06 DIAGNOSIS — H5203 Hypermetropia, bilateral: Secondary | ICD-10-CM | POA: Diagnosis not present

## 2017-08-17 DIAGNOSIS — R319 Hematuria, unspecified: Secondary | ICD-10-CM | POA: Diagnosis not present

## 2017-08-17 DIAGNOSIS — N3001 Acute cystitis with hematuria: Secondary | ICD-10-CM | POA: Diagnosis not present

## 2017-10-02 DIAGNOSIS — F419 Anxiety disorder, unspecified: Secondary | ICD-10-CM | POA: Diagnosis not present

## 2017-10-02 DIAGNOSIS — F5101 Primary insomnia: Secondary | ICD-10-CM | POA: Diagnosis not present

## 2017-10-02 DIAGNOSIS — N951 Menopausal and female climacteric states: Secondary | ICD-10-CM | POA: Diagnosis not present

## 2017-10-02 DIAGNOSIS — R002 Palpitations: Secondary | ICD-10-CM | POA: Diagnosis not present

## 2017-10-02 DIAGNOSIS — M1612 Unilateral primary osteoarthritis, left hip: Secondary | ICD-10-CM | POA: Diagnosis not present

## 2017-10-02 DIAGNOSIS — E7800 Pure hypercholesterolemia, unspecified: Secondary | ICD-10-CM | POA: Diagnosis not present

## 2017-10-02 DIAGNOSIS — K219 Gastro-esophageal reflux disease without esophagitis: Secondary | ICD-10-CM | POA: Diagnosis not present

## 2017-11-08 DIAGNOSIS — R319 Hematuria, unspecified: Secondary | ICD-10-CM | POA: Diagnosis not present

## 2017-11-08 DIAGNOSIS — R197 Diarrhea, unspecified: Secondary | ICD-10-CM | POA: Diagnosis not present

## 2018-01-09 DIAGNOSIS — M1611 Unilateral primary osteoarthritis, right hip: Secondary | ICD-10-CM | POA: Diagnosis not present

## 2018-01-09 DIAGNOSIS — M1612 Unilateral primary osteoarthritis, left hip: Secondary | ICD-10-CM | POA: Diagnosis not present

## 2018-01-09 DIAGNOSIS — Z96652 Presence of left artificial knee joint: Secondary | ICD-10-CM | POA: Diagnosis not present

## 2018-01-21 DIAGNOSIS — K591 Functional diarrhea: Secondary | ICD-10-CM | POA: Diagnosis not present

## 2018-01-21 DIAGNOSIS — M545 Low back pain: Secondary | ICD-10-CM | POA: Diagnosis not present

## 2018-01-21 DIAGNOSIS — R1084 Generalized abdominal pain: Secondary | ICD-10-CM | POA: Diagnosis not present

## 2018-02-25 ENCOUNTER — Ambulatory Visit: Payer: PPO | Admitting: Gastroenterology

## 2018-03-19 NOTE — Patient Instructions (Addendum)
[name]  03/19/2018   Your procedure is scheduled on: 03-25-18  Report to [facility] Main  Entrance  Report to admitting at 1030 AM    Call this number if you have problems the morning of surgery [phone]   Remember: Do not eat food :After Midnight, MAY HAVE CLEAR LIIQUIDS FROM MIDNIGHT UNTIL 700 AM DAY OF SURGERY, NOTHING BY MOUTH AFTER 700 AM DAY OF SURGERY.Marland Kitchen     CLEAR LIQUID DIET   Foods Allowed                                                                     Foods Excluded  Coffee and tea, regular and decaf                             liquids that you cannot  Plain Jell-O in any flavor                                             see through such as: Fruit ices (not with fruit pulp)                                     milk, soups, orange juice  Iced Popsicles                                    All solid food Carbonated beverages, regular and diet                                    Cranberry, grape and apple juices Sports drinks like Gatorade Lightly seasoned clear broth or consume(fat free) Sugar, honey syrup  Sample Menu Breakfast                                Lunch                                     Supper Cranberry juice                    Beef broth                            Chicken broth Jell-O                                     Grape juice                           Apple juice  Coffee or tea                        Jell-O                                      Popsicle                                                Coffee or tea                        Coffee or tea  _____________________________________________________________________     Take these medicines the morning of surgery with A SIP OF WATER: HYDROCODONE IF NEEDED, ALPRAZOLAM (XANAX ) IF NEEDED, PANTAPRAZOLE (PROTONIX)                                You may not have any metal on your body including hair pins and              piercings  Do not wear jewelry, make-up,  lotions, powders or perfumes, deodorant             Do not wear nail polish.  Do not shave  48 hours prior to surgery.                Do not bring valuables to the hospital. [place].  Contacts, dentures or bridgework may not be worn into surgery.  Leave suitcase in the car. After surgery it may be brought to your room.                  Please read over the following fact sheets you were given: _____________________________________________________________________  [facility] - Preparing for Surgery Before surgery, you can play an important role.  Because skin is not sterile, your skin needs to be as free of germs as possible.  You can reduce the number of germs on your skin by washing with CHG (chlorahexidine gluconate) soap before surgery.  CHG is an antiseptic cleaner which kills germs and bonds with the skin to continue killing germs even after washing. Please DO NOT use if you have an allergy to CHG or antibacterial soaps.  If your skin becomes reddened/irritated stop using the CHG and inform your nurse when you arrive at Short Stay. Do not shave (including legs and underarms) for at least 48 hours prior to the first CHG shower.  You may shave your face/neck. Please follow these instructions carefully:  1.  Shower with CHG Soap the night before surgery and the  morning of Surgery.  2.  If you choose to wash your hair, wash your hair first as usual with your  normal  shampoo.  3.  After you shampoo, rinse your hair and body thoroughly to remove the  shampoo.                           4.  Use CHG as you would any other liquid soap.  You can apply chg directly  to  the skin and wash                       Gently with a scrungie or clean washcloth.  5.  Apply the CHG Soap to your body ONLY FROM THE NECK DOWN.   Do not use on face/ open                           Wound or open sores. Avoid contact with eyes, ears mouth and genitals (private  parts).                       Wash face,  Genitals (private parts) with your normal soap.             6.  Wash thoroughly, paying special attention to the area where your surgery  will be performed.  7.  Thoroughly rinse your body with warm water from the neck down.  8.  DO NOT shower/wash with your normal soap after using and rinsing off  the CHG Soap.                9.  Pat yourself dry with a clean towel.            10.  Wear clean pajamas.            11.  Place clean sheets on your bed the night of your first shower and do not  sleep with pets. Day of Surgery : Do not apply any lotions/deodorants the morning of surgery.  Please wear clean clothes to the hospital/surgery center.  FAILURE TO FOLLOW THESE INSTRUCTIONS MAY RESULT IN THE CANCELLATION OF YOUR SURGERY PATIENT SIGNATURE_________________________________  NURSE SIGNATURE__________________________________  ________________________________________________________________________   Adam Phenix  An incentive spirometer is a tool that can help keep your lungs clear and active. This tool measures how well you are filling your lungs with each breath. Taking long deep breaths may help reverse or decrease the chance of developing breathing (pulmonary) problems (especially infection) following:  A long period of time when you are unable to move or be active. BEFORE THE PROCEDURE   If the spirometer includes an indicator to show your best effort, your nurse or respiratory therapist will set it to a desired goal.  If possible, sit up straight or lean slightly forward. Try not to slouch.  Hold the incentive spirometer in an upright position. INSTRUCTIONS FOR USE  1. Sit on the edge of your bed if possible, or sit up as far as you can in bed or on a chair. 2. Hold the incentive spirometer in an upright position. 3. Breathe out normally. 4. Place the mouthpiece in your mouth and seal your lips tightly around it. 5. Breathe in  slowly and as deeply as possible, raising the piston or the ball toward the top of the column. 6. Hold your breath for 3-5 seconds or for as long as possible. Allow the piston or ball to fall to the bottom of the column. 7. Remove the mouthpiece from your mouth and breathe out normally. 8. Rest for a few seconds and repeat Steps 1 through 7 at least 10 times every 1-2 hours when you are awake. Take your time and take a few normal breaths between deep breaths. 9. The spirometer may include an indicator to show your best effort. Use the indicator as a goal to work toward during each repetition. 10. After each  set of 10 deep breaths, practice coughing to be sure your lungs are clear. If you have an incision (the cut made at the time of surgery), support your incision when coughing by placing a pillow or rolled up towels firmly against it. Once you are able to get out of bed, walk around indoors and cough well. You may stop using the incentive spirometer when instructed by your caregiver.  RISKS AND COMPLICATIONS  Take your time so you do not get dizzy or light-headed.  If you are in pain, you may need to take or ask for pain medication before doing incentive spirometry. It is harder to take a deep breath if you are having pain. AFTER USE  Rest and breathe slowly and easily.  It can be helpful to keep track of a log of your progress. Your caregiver can provide you with a simple table to help with this. If you are using the spirometer at home, follow these instructions: [place] IF:   You are having difficultly using the spirometer.  You have trouble using the spirometer as often as instructed.  Your pain medication is not giving enough relief while using the spirometer.  You develop fever of 100.5 F (38.1 C) or higher. SEEK IMMEDIATE MEDICAL CARE IF:   You cough up bloody sputum that had not been present before.  You develop fever of 102 F (38.9 C) or greater.  You develop  worsening pain at or near the incision site. MAKE SURE YOU:   Understand these instructions.  Will watch your condition.  Will get help right away if you are not doing well or get worse. Document Released: 11/11/2006 Document Revised: 09/23/2011 Document Reviewed: 01/12/2007 ExitCare Patient Information 2014 ExitCare, [place].   ________________________________________________________________________  WHAT IS A BLOOD TRANSFUSION? Blood Transfusion Information  A transfusion is the replacement of blood or some of its parts. Blood is made up of multiple cells which provide different functions.  Red blood cells carry oxygen and are used for blood loss replacement.  White blood cells fight against infection.  Platelets control bleeding.  Plasma helps clot blood.  Other blood products are available for specialized needs, such as hemophilia or other clotting disorders. BEFORE THE TRANSFUSION  Who gives blood for transfusions?   Healthy volunteers who are fully evaluated to make sure their blood is safe. This is blood bank blood. Transfusion therapy is the safest it has ever been in the practice of medicine. Before blood is taken from a donor, a complete history is taken to make sure that person has no history of diseases nor engages in risky social behavior (examples are intravenous drug use or sexual activity with multiple partners). The donor's travel history is screened to minimize risk of transmitting infections, such as malaria. The donated blood is tested for signs of infectious diseases, such as HIV and hepatitis. The blood is then tested to be sure it is compatible with you in order to minimize the chance of a transfusion reaction. If you or a relative donates blood, this is often done in anticipation of surgery and is not appropriate for emergency situations. It takes many days to process the donated blood. RISKS AND COMPLICATIONS Although transfusion therapy is very safe and saves  many lives, the main dangers of transfusion include:   Getting an infectious disease.  Developing a transfusion reaction. This is an allergic reaction to something in the blood you were given. Every precaution is taken to prevent this. The decision to have  a blood transfusion has been considered carefully by your caregiver before blood is given. Blood is not given unless the benefits outweigh the risks. AFTER THE TRANSFUSION  Right after receiving a blood transfusion, you will usually feel much better and more energetic. This is especially true if your red blood cells have gotten low (anemic). The transfusion raises the level of the red blood cells which carry oxygen, and this usually causes an energy increase.  The nurse administering the transfusion will monitor you carefully for complications. HOME CARE INSTRUCTIONS  No special instructions are needed after a transfusion. You may find your energy is better. Speak with your caregiver about any limitations on activity for underlying diseases you may have. SEEK MEDICAL CARE IF:   Your condition is not improving after your transfusion.  You develop redness or irritation at the intravenous (IV) site. SEEK IMMEDIATE MEDICAL CARE IF:  Any of the following symptoms occur over the next 12 hours:  Shaking chills.  You have a temperature by mouth above 102 F (38.9 C), not controlled by medicine.  Chest, back, or muscle pain.  People around you feel you are not acting correctly or are confused.  Shortness of breath or difficulty breathing.  Dizziness and fainting.  You get a rash or develop hives.  You have a decrease in urine output.  Your urine turns a dark color or changes to pink, red, or brown. Any of the following symptoms occur over the next 10 days:  You have a temperature by mouth above 102 F (38.9 C), not controlled by medicine.  Shortness of breath.  Weakness after normal activity.  The white part of the eye turns  yellow (jaundice).  You have a decrease in the amount of urine or are urinating less often.  Your urine turns a dark color or changes to pink, red, or brown. Document Released: 06/28/2000 Document Revised: 09/23/2011 Document Reviewed: 02/15/2008 [facility] Patient Information 2014 [place], [place].  _______________________________________________________________________

## 2018-03-19 NOTE — Progress Notes (Signed)
LOV DR Shirline Frees 10-02-17 ON CHART MEDICAL CLEARANCE NOTE DR Shirline Frees 01-22-18 ON CHART FOR 03-25-18 SURGERY

## 2018-03-20 ENCOUNTER — Encounter (HOSPITAL_COMMUNITY): Payer: Self-pay

## 2018-03-20 ENCOUNTER — Other Ambulatory Visit: Payer: Self-pay

## 2018-03-20 ENCOUNTER — Encounter (HOSPITAL_COMMUNITY)
Admission: RE | Admit: 2018-03-20 | Discharge: 2018-03-20 | Disposition: A | Discharge status: Home / Self Care | Payer: PPO | Source: Ambulatory Visit | Attending: Orthopedic Surgery | Admitting: Orthopedic Surgery

## 2018-03-20 DIAGNOSIS — Z01812 Encounter for preprocedural laboratory examination: Secondary | ICD-10-CM | POA: Insufficient documentation

## 2018-03-20 HISTORY — DX: Diarrhea, unspecified: R19.7

## 2018-03-20 HISTORY — DX: Dry eye syndrome of bilateral lacrimal glands: H04.123

## 2018-03-20 LAB — SURGICAL PCR SCREEN
MRSA, PCR: NEGATIVE
Staphylococcus aureus: NEGATIVE

## 2018-03-20 LAB — COMPREHENSIVE METABOLIC PANEL WITH GFR
ALT: 24 U/L (ref 0–44)
AST: 27 U/L (ref 15–41)
Albumin: 3.9 g/dL (ref 3.5–5.0)
Alkaline Phosphatase: 64 U/L (ref 38–126)
Anion gap: 7 (ref 5–15)
BUN: 18 mg/dL (ref 8–23)
CO2: 28 mmol/L (ref 22–32)
Calcium: 9.2 mg/dL (ref 8.9–10.3)
Chloride: 107 mmol/L (ref 98–111)
Creatinine, Ser: 0.71 mg/dL (ref 0.44–1.00)
GFR calc Af Amer: >60 mL/min
GFR calc non Af Amer: >60 mL/min
Glucose, Bld: 92 mg/dL (ref 70–99)
Potassium: 5.3 mmol/L — ABNORMAL HIGH (ref 3.5–5.1)
Sodium: 142 mmol/L (ref 135–145)
Total Bilirubin: 0.7 mg/dL (ref 0.3–1.2)
Total Protein: 7 g/dL (ref 6.5–8.1)

## 2018-03-20 LAB — PROTIME-INR
INR: 0.9
Prothrombin Time: 12 s (ref 11.4–15.2)

## 2018-03-20 LAB — CBC
HCT: 41.7 % (ref 36.0–46.0)
Hemoglobin: 13.8 g/dL (ref 12.0–15.0)
MCH: 31.3 pg (ref 26.0–34.0)
MCHC: 33.1 g/dL (ref 30.0–36.0)
MCV: 94.6 fL (ref 78.0–100.0)
Platelets: 272 10*3/uL (ref 150–400)
RBC: 4.41 MIL/uL (ref 3.87–5.11)
RDW: 13.2 % (ref 11.5–15.5)
WBC: 7 10*3/uL (ref 4.0–10.5)

## 2018-03-20 LAB — APTT: aPTT: 28 s (ref 24–36)

## 2018-03-20 NOTE — Progress Notes (Signed)
NEED FINAL EKG AND PCR SCREEN RESULT CHART LEFT WITH DENISE SEXTON, RN FOR FOLLOW UP

## 2018-03-24 NOTE — H&P (Signed)
TOTAL HIP ADMISSION H&P  Patient is admitted for left total hip arthroplasty.  Subjective:  Chief Complaint: left hip pain  HPI: [name], 72 y.o. female, has a history of pain and functional disability in the left hip(s) due to arthritis and patient has failed non-surgical conservative treatments for greater than 12 weeks to include NSAID's and/or analgesics, corticosteriod injections, flexibility and strengthening excercises and activity modification.  Onset of symptoms was gradual starting 5 years ago with gradually worsening course since that time.The patient noted no past surgery on the left hip(s).  Patient currently rates pain in the left hip at 7 out of 10 with activity. Patient has night pain, worsening of pain with activity and weight bearing, pain that interfers with activities of daily living and pain with passive range of motion. Patient has evidence of subchondral cysts, periarticular osteophytes and joint space narrowing by imaging studies. This condition presents safety issues increasing the risk of falls. There is no current active infection.  Patient Active Problem List   Diagnosis Date Noted  . OA (osteoarthritis) of knee 08/02/2015  . Fluid retention   . GASTROENTERITIS, ACUTE 09/19/2009  . DEGENERATIVE JOINT DISEASE, LUMBAR SPINE 06/07/2009  . ECZEMA 09/28/2008  . HERNIATED LUMBOSACRAL DISC 04/13/2008  . SCIATICA, ACUTE 04/13/2008  . LOW BACK PAIN 04/04/2008  . BACK PAIN 04/04/2008  . IBS 09/09/2007  . ARTHRITIS 09/09/2007  . EDEMA 09/09/2007  . HYPERTENSION, LABILE, HX OF 04/02/2007  . ALLERGIC RHINITIS 03/30/2007   Past Medical History:  Diagnosis Date  . Allergy   . Arthritis    oa  . Cataract of both eyes   . Cervical dysplasia   . Diarrhea    off and on last 3 months 1 episode today  . Dry eyes   . Fluid retention    slight leg swelling at times  . GERD (gastroesophageal reflux disease)   . Legally blind in left eye, as defined in [place]    since  birth   . Low back pain   . Lumbar herniated disc   . Numbness and tingling    feet bilat   . Osteopenia 06/2015    T score -1.7  . Syncope    none recent  . Urinary frequency   . Urinary hesitancy   . Wears glasses     Past Surgical History:  Procedure Laterality Date  . LUMBAR LAMINECTOMY     2009 secondary to ruptured disc;   . TOTAL KNEE ARTHROPLASTY Left 08/02/2015   Procedure: LEFT TOTAL KNEE ARTHROPLASTY;  Surgeon: Gaynelle Arabian, MD;  Location: [facility];  Service: Orthopedics;  Laterality: Left;  Marland Kitchen VAGINAL HYSTERECTOMY  1980 or 1981   partial    Current Outpatient Medications  Medication Sig Dispense Refill Last Dose  . ALPRAZolam (XANAX) 0.25 MG tablet Take 0.25 mg by mouth 2 (two) times daily as needed for anxiety.    Taking  . aspirin EC 81 MG tablet Take 81 mg by mouth daily.     . Calcium Carb-Cholecalciferol (CALCIUM 600+D3 PO) Take 1 tablet by mouth 2 (two) times daily.     . cholecalciferol (VITAMIN D) 1000 units tablet Take 1,000 Units by mouth every evening.     Marland Kitchen estradiol (ESTRACE) 0.5 MG tablet Take 1 tablet (0.5 mg total) by mouth daily. (Patient taking differently: Take 0.5 mg by mouth every evening. ) 90 tablet 4   . HYDROcodone-acetaminophen (NORCO) 10-325 MG tablet Take 1 tablet by mouth daily as needed (for  activity.).     Marland Kitchen loteprednol (LOTEMAX) 0.5 % ophthalmic suspension Place 1 drop into both eyes 2 (two) times daily as needed (for dry eyes).     . Multiple Vitamin (MULTIVITAMIN WITH MINERALS) TABS tablet Take 1 tablet by mouth daily. Senior Multivitamin     . Omega-3 Fatty Acids (FISH OIL) 1000 MG CAPS Take 1,000 mg by mouth 2 (two) times daily.     . pantoprazole (PROTONIX) 40 MG tablet Take 40 mg by mouth 2 (two) times daily.    Not Taking  . potassium chloride SA (K-DUR,KLOR-CON) 20 MEQ tablet Take 20 mEq by mouth every Monday, Wednesday, and Friday.    Taking  . Red Yeast Rice 600 MG CAPS Take 600 mg by mouth 2 (two) times daily.     . temazepam  (RESTORIL) 15 MG capsule Take 15 mg by mouth at bedtime.    Taking  . loperamide (IMODIUM) 2 MG capsule Take by mouth as needed for diarrhea or loose stools.      No Known Allergies  Social History   Tobacco Use  . Smoking status: Never Smoker  . Smokeless tobacco: Never Used  Substance Use Topics  . Alcohol use: No    Alcohol/week: 0.0 standard drinks    Family History  Problem Relation Age of Onset  . Heart disease Father   . Cancer Father        colon  . Diabetes Father        borderline  . Colon cancer Father   . Diabetes Brother   . Hypertension Brother      Review of Systems  Constitutional: Negative.   HENT: Negative.   Eyes: Negative.   Respiratory: Negative.   Cardiovascular: Negative.   Gastrointestinal: Negative.   Genitourinary: Negative.   Musculoskeletal: Positive for joint pain and myalgias. Negative for back pain, falls and neck pain.  Skin: Negative.   Neurological: Negative.   Endo/Heme/Allergies: Negative.   Psychiatric/Behavioral: Negative.     Objective:  Physical Exam  Constitutional: She is oriented to person, place, and time. She appears well-developed and well-nourished. No distress.  HENT:  Head: Normocephalic and atraumatic.  Right Ear: External ear normal.  Left Ear: External ear normal.  Nose: Nose normal.  Mouth/Throat: Oropharynx is clear and moist.  Eyes: Conjunctivae and EOM are normal.  Neck: Normal range of motion. Neck supple.  Cardiovascular: Normal rate, regular rhythm, normal heart sounds and intact distal pulses.  No murmur heard. Respiratory: Effort normal and breath sounds normal. No respiratory distress. She has no wheezes.  GI: Soft. Bowel sounds are normal. She exhibits no distension. There is no tenderness.  Musculoskeletal:  Musculoskeletal Right Hip Exam: ROM: Normal without discomfort. There is no tenderness over the greater trochanter. There is no pain on provocative testing of the hip.  Left Hip  Exam: ROM: Flexion to 120, Internal Rotation is minimal, External Rotation 20, and Abduction 20 degrees. There is no tenderness over the greater trochanter. There is no pain on provocative testing of the hip  Left Knee Exam: No effusion. Range of motion is 0-125 degrees. No crepitus on range of motion of the knee. No medial or lateral joint line tenderness. Stable knee.  Neurological: She is alert and oriented to person, place, and time. She has normal strength. No sensory deficit.  Skin: No rash noted. She is not diaphoretic. No erythema.  Psychiatric: She has a normal mood and affect. Her behavior is normal.  Imaging Review Plain radiographs demonstrate severe degenerative joint disease of the left hip(s). The bone quality appears to be good for age and reported activity level.    Preoperative templating of the joint replacement has been completed, documented, and submitted to the Operating Room personnel in order to optimize intra-operative equipment management.     Assessment/Plan:  End stage primary osteoarthritis, left hip(s)  The patient history, physical examination, clinical judgement of the provider and imaging studies are consistent with end stage degenerative joint disease of the left hip(s) and total hip arthroplasty is deemed medically necessary. The treatment options including medical management, injection therapy, arthroscopy and arthroplasty were discussed at length. The risks and benefits of total hip arthroplasty were presented and reviewed. The risks due to aseptic loosening, infection, stiffness, dislocation/subluxation,  thromboembolic complications and other imponderables were discussed.  The patient acknowledged the explanation, agreed to proceed with the plan and consent was signed. Patient is being admitted for inpatient treatment for surgery, pain control, PT, OT, prophylactic antibiotics, VTE prophylaxis, progressive ambulation and ADL's and discharge  planning.The patient is planning to be discharged home with home health services    Therapy Plans: HEP vs HHPT Disposition: Home with husband Planned DVT prophylaxis: aspirin 325mg  BID DME needed: none needed PCP: Dr. Kenton Kingfisher Other: previous lumbar surgery but no hardware    Ardeen Jourdain, PA-C

## 2018-03-25 ENCOUNTER — Inpatient Hospital Stay (HOSPITAL_COMMUNITY)
Admission: RE | Admit: 2018-03-25 | Discharge: 2018-03-26 | DRG: 470 | Disposition: A | Discharge status: Home Health | Payer: PPO | Source: Ambulatory Visit | Attending: Orthopedic Surgery | Admitting: Orthopedic Surgery

## 2018-03-25 ENCOUNTER — Inpatient Hospital Stay (HOSPITAL_COMMUNITY): Payer: PPO | Admitting: Certified Registered Nurse Anesthetist

## 2018-03-25 ENCOUNTER — Encounter (HOSPITAL_COMMUNITY): Payer: Self-pay | Admitting: *Deleted

## 2018-03-25 ENCOUNTER — Other Ambulatory Visit: Payer: Self-pay

## 2018-03-25 ENCOUNTER — Inpatient Hospital Stay (HOSPITAL_COMMUNITY): Payer: PPO

## 2018-03-25 ENCOUNTER — Encounter (HOSPITAL_COMMUNITY)
Admission: RE | Disposition: A | Discharge status: Home Health | Payer: Self-pay | Source: Ambulatory Visit | Attending: Orthopedic Surgery

## 2018-03-25 DIAGNOSIS — Z8249 Family history of ischemic heart disease and other diseases of the circulatory system: Secondary | ICD-10-CM | POA: Diagnosis not present

## 2018-03-25 DIAGNOSIS — M25552 Pain in left hip: Secondary | ICD-10-CM | POA: Diagnosis present

## 2018-03-25 DIAGNOSIS — H548 Legal blindness, as defined in USA: Secondary | ICD-10-CM | POA: Diagnosis present

## 2018-03-25 DIAGNOSIS — J309 Allergic rhinitis, unspecified: Secondary | ICD-10-CM | POA: Diagnosis present

## 2018-03-25 DIAGNOSIS — Z833 Family history of diabetes mellitus: Secondary | ICD-10-CM | POA: Diagnosis not present

## 2018-03-25 DIAGNOSIS — Z96652 Presence of left artificial knee joint: Secondary | ICD-10-CM | POA: Diagnosis present

## 2018-03-25 DIAGNOSIS — Z800 Family history of malignant neoplasm of digestive organs: Secondary | ICD-10-CM

## 2018-03-25 DIAGNOSIS — Z96649 Presence of unspecified artificial hip joint: Secondary | ICD-10-CM

## 2018-03-25 DIAGNOSIS — K589 Irritable bowel syndrome without diarrhea: Secondary | ICD-10-CM | POA: Diagnosis present

## 2018-03-25 DIAGNOSIS — K219 Gastro-esophageal reflux disease without esophagitis: Secondary | ICD-10-CM | POA: Diagnosis present

## 2018-03-25 DIAGNOSIS — L309 Dermatitis, unspecified: Secondary | ICD-10-CM | POA: Diagnosis present

## 2018-03-25 DIAGNOSIS — M169 Osteoarthritis of hip, unspecified: Secondary | ICD-10-CM | POA: Diagnosis present

## 2018-03-25 DIAGNOSIS — M1612 Unilateral primary osteoarthritis, left hip: Secondary | ICD-10-CM | POA: Diagnosis present

## 2018-03-25 DIAGNOSIS — M8580 Other specified disorders of bone density and structure, unspecified site: Secondary | ICD-10-CM | POA: Diagnosis present

## 2018-03-25 DIAGNOSIS — M479 Spondylosis, unspecified: Secondary | ICD-10-CM | POA: Diagnosis present

## 2018-03-25 DIAGNOSIS — Z471 Aftercare following joint replacement surgery: Secondary | ICD-10-CM | POA: Diagnosis not present

## 2018-03-25 DIAGNOSIS — I10 Essential (primary) hypertension: Secondary | ICD-10-CM | POA: Diagnosis present

## 2018-03-25 DIAGNOSIS — Z7982 Long term (current) use of aspirin: Secondary | ICD-10-CM | POA: Diagnosis not present

## 2018-03-25 DIAGNOSIS — Z96642 Presence of left artificial hip joint: Secondary | ICD-10-CM | POA: Diagnosis not present

## 2018-03-25 HISTORY — PX: TOTAL HIP ARTHROPLASTY: SHX124

## 2018-03-25 LAB — TYPE AND SCREEN
ABO/RH(D): O POS
Antibody Screen: NEGATIVE

## 2018-03-25 SURGERY — TOTAL HIP ARTHROPLASTY ANTERIOR APPROACH
Anesthesia: Spinal | Site: Hip | Laterality: Left

## 2018-03-25 MED ORDER — DIPHENHYDRAMINE HCL 12.5 MG/5ML PO ELIX: 12.5000 mg | ORAL_SOLUTION | ORAL | Status: DC | PRN

## 2018-03-25 MED ORDER — PHENOL 1.4 % MT LIQD: 1.0000 | OROMUCOSAL | Status: DC | PRN

## 2018-03-25 MED ORDER — STERILE WATER FOR IRRIGATION IR SOLN
Status: DC | PRN
  Administered 2018-03-25: 2000 mL

## 2018-03-25 MED ORDER — PROPOFOL 10 MG/ML IV BOLUS
INTRAVENOUS | Status: DC | PRN
  Administered 2018-03-25 (×2): 10 mg via INTRAVENOUS

## 2018-03-25 MED ORDER — BUPIVACAINE IN DEXTROSE 0.75-8.25 % IT SOLN
INTRATHECAL | Status: DC | PRN
  Administered 2018-03-25: 1.8 mL via INTRATHECAL

## 2018-03-25 MED ORDER — PHENYLEPHRINE 40 MCG/ML (10ML) SYRINGE FOR IV PUSH (FOR BLOOD PRESSURE SUPPORT)
PREFILLED_SYRINGE | INTRAVENOUS | Status: DC | PRN
  Administered 2018-03-25: 80 ug via INTRAVENOUS
  Administered 2018-03-25: 40 ug via INTRAVENOUS

## 2018-03-25 MED ORDER — PHENYLEPHRINE 40 MCG/ML (10ML) SYRINGE FOR IV PUSH (FOR BLOOD PRESSURE SUPPORT)
PREFILLED_SYRINGE | INTRAVENOUS | Status: AC
  Filled 2018-03-25: qty 10

## 2018-03-25 MED ORDER — ONDANSETRON HCL 4 MG/2ML IJ SOLN
INTRAMUSCULAR | Status: DC | PRN
  Administered 2018-03-25: 4 mg via INTRAVENOUS

## 2018-03-25 MED ORDER — BUPIVACAINE HCL (PF) 0.25 % IJ SOLN
INTRAMUSCULAR | Status: DC | PRN
  Administered 2018-03-25: 30 mL

## 2018-03-25 MED ORDER — FENTANYL CITRATE (PF) 100 MCG/2ML IJ SOLN
INTRAMUSCULAR | Status: AC
  Filled 2018-03-25: qty 4

## 2018-03-25 MED ORDER — ONDANSETRON HCL 4 MG/2ML IJ SOLN: 4.0000 mg | Freq: Four times a day (QID) | INTRAMUSCULAR | Status: DC | PRN

## 2018-03-25 MED ORDER — FLEET ENEMA 7-19 GM/118ML RE ENEM: 1.0000 | ENEMA | Freq: Once | RECTAL | Status: DC | PRN

## 2018-03-25 MED ORDER — ACETAMINOPHEN 10 MG/ML IV SOLN
1000.0000 mg | Freq: Four times a day (QID) | INTRAVENOUS | Status: DC
  Administered 2018-03-25: 1000 mg via INTRAVENOUS
  Filled 2018-03-25 (×2): qty 100

## 2018-03-25 MED ORDER — DEXAMETHASONE SODIUM PHOSPHATE 10 MG/ML IJ SOLN
INTRAMUSCULAR | Status: AC
  Filled 2018-03-25: qty 1

## 2018-03-25 MED ORDER — TRANEXAMIC ACID 1000 MG/10ML IV SOLN
1000.0000 mg | INTRAVENOUS | Status: AC
  Administered 2018-03-25: 1000 mg via INTRAVENOUS
  Filled 2018-03-25: qty 10

## 2018-03-25 MED ORDER — ONDANSETRON HCL 4 MG PO TABS: 4.0000 mg | ORAL_TABLET | Freq: Four times a day (QID) | ORAL | Status: DC | PRN

## 2018-03-25 MED ORDER — PROPOFOL 10 MG/ML IV BOLUS
INTRAVENOUS | Status: AC
  Filled 2018-03-25: qty 20

## 2018-03-25 MED ORDER — ROCURONIUM BROMIDE 10 MG/ML (PF) SYRINGE
PREFILLED_SYRINGE | INTRAVENOUS | Status: AC
  Filled 2018-03-25: qty 10

## 2018-03-25 MED ORDER — METOCLOPRAMIDE HCL 5 MG/ML IJ SOLN: 5.0000 mg | Freq: Three times a day (TID) | INTRAMUSCULAR | Status: DC | PRN

## 2018-03-25 MED ORDER — SODIUM CHLORIDE 0.9 % IV SOLN
INTRAVENOUS | Status: DC
  Administered 2018-03-25 (×2): via INTRAVENOUS

## 2018-03-25 MED ORDER — FENTANYL CITRATE (PF) 100 MCG/2ML IJ SOLN
25.0000 ug | INTRAMUSCULAR | Status: DC | PRN
  Administered 2018-03-25 (×3): 50 ug via INTRAVENOUS

## 2018-03-25 MED ORDER — DOCUSATE SODIUM 100 MG PO CAPS
100.0000 mg | ORAL_CAPSULE | Freq: Two times a day (BID) | ORAL | Status: DC
  Administered 2018-03-25 – 2018-03-26 (×2): 100 mg via ORAL
  Filled 2018-03-25 (×2): qty 1

## 2018-03-25 MED ORDER — POLYETHYLENE GLYCOL 3350 17 G PO PACK
17.0000 g | PACK | Freq: Every day | ORAL | Status: DC | PRN
  Administered 2018-03-26: 17 g via ORAL
  Filled 2018-03-25: qty 1

## 2018-03-25 MED ORDER — HYDROCODONE-ACETAMINOPHEN 7.5-325 MG PO TABS
1.0000 | ORAL_TABLET | ORAL | Status: DC | PRN
  Administered 2018-03-25 (×2): 1 via ORAL
  Filled 2018-03-25 (×2): qty 1

## 2018-03-25 MED ORDER — CEFAZOLIN SODIUM-DEXTROSE 2-4 GM/100ML-% IV SOLN
2.0000 g | INTRAVENOUS | Status: AC
  Administered 2018-03-25: 2 g via INTRAVENOUS
  Filled 2018-03-25: qty 100

## 2018-03-25 MED ORDER — DEXAMETHASONE SODIUM PHOSPHATE 10 MG/ML IJ SOLN
10.0000 mg | Freq: Once | INTRAMUSCULAR | Status: AC
  Administered 2018-03-26: 10 mg via INTRAVENOUS
  Filled 2018-03-25: qty 1

## 2018-03-25 MED ORDER — PROPOFOL 500 MG/50ML IV EMUL
INTRAVENOUS | Status: DC | PRN
  Administered 2018-03-25: 75 ug/kg/min via INTRAVENOUS

## 2018-03-25 MED ORDER — ASPIRIN EC 325 MG PO TBEC
325.0000 mg | DELAYED_RELEASE_TABLET | Freq: Two times a day (BID) | ORAL | Status: DC
  Administered 2018-03-26: 325 mg via ORAL
  Filled 2018-03-25: qty 1

## 2018-03-25 MED ORDER — PANTOPRAZOLE SODIUM 40 MG PO TBEC
40.0000 mg | DELAYED_RELEASE_TABLET | Freq: Two times a day (BID) | ORAL | Status: DC
  Administered 2018-03-25 – 2018-03-26 (×2): 40 mg via ORAL
  Filled 2018-03-25 (×2): qty 1

## 2018-03-25 MED ORDER — METOCLOPRAMIDE HCL 5 MG PO TABS
5.0000 mg | ORAL_TABLET | Freq: Three times a day (TID) | ORAL | Status: DC | PRN
  Administered 2018-03-25: 5 mg via ORAL
  Filled 2018-03-25: qty 1

## 2018-03-25 MED ORDER — ACETAMINOPHEN 500 MG PO TABS
500.0000 mg | ORAL_TABLET | Freq: Four times a day (QID) | ORAL | Status: DC
  Administered 2018-03-25 – 2018-03-26 (×3): 500 mg via ORAL
  Filled 2018-03-25 (×3): qty 1

## 2018-03-25 MED ORDER — POTASSIUM CHLORIDE CRYS ER 20 MEQ PO TBCR: 20.0000 meq | EXTENDED_RELEASE_TABLET | ORAL | Status: DC

## 2018-03-25 MED ORDER — ALPRAZOLAM 0.25 MG PO TABS
0.2500 mg | ORAL_TABLET | Freq: Two times a day (BID) | ORAL | Status: DC | PRN
  Administered 2018-03-25: 0.25 mg via ORAL
  Filled 2018-03-25: qty 1

## 2018-03-25 MED ORDER — LOPERAMIDE HCL 2 MG PO CAPS: 2.0000 mg | ORAL_CAPSULE | ORAL | Status: DC | PRN

## 2018-03-25 MED ORDER — MENTHOL 3 MG MT LOZG: 1.0000 | LOZENGE | OROMUCOSAL | Status: DC | PRN

## 2018-03-25 MED ORDER — LACTATED RINGERS IV SOLN
INTRAVENOUS | Status: DC
  Administered 2018-03-25 (×3): via INTRAVENOUS

## 2018-03-25 MED ORDER — TRAMADOL HCL 50 MG PO TABS: 50.0000 mg | ORAL_TABLET | Freq: Four times a day (QID) | ORAL | Status: DC | PRN

## 2018-03-25 MED ORDER — BISACODYL 10 MG RE SUPP: 10.0000 mg | Freq: Every day | RECTAL | Status: DC | PRN

## 2018-03-25 MED ORDER — MORPHINE SULFATE (PF) 2 MG/ML IV SOLN: 0.5000 mg | INTRAVENOUS | Status: DC | PRN

## 2018-03-25 MED ORDER — LOTEPREDNOL ETABONATE 0.5 % OP SUSP: 1.0000 [drp] | Freq: Two times a day (BID) | OPHTHALMIC | Status: DC | PRN

## 2018-03-25 MED ORDER — BUPIVACAINE HCL (PF) 0.25 % IJ SOLN
INTRAMUSCULAR | Status: AC
  Filled 2018-03-25: qty 30

## 2018-03-25 MED ORDER — DEXAMETHASONE SODIUM PHOSPHATE 10 MG/ML IJ SOLN
8.0000 mg | Freq: Once | INTRAMUSCULAR | Status: AC
  Administered 2018-03-25: 10 mg via INTRAVENOUS

## 2018-03-25 MED ORDER — TEMAZEPAM 15 MG PO CAPS
15.0000 mg | ORAL_CAPSULE | Freq: Every day | ORAL | Status: DC
  Administered 2018-03-25: 15 mg via ORAL
  Filled 2018-03-25: qty 1

## 2018-03-25 MED ORDER — METHOCARBAMOL 500 MG PO TABS
500.0000 mg | ORAL_TABLET | Freq: Four times a day (QID) | ORAL | Status: DC | PRN
  Administered 2018-03-25: 500 mg via ORAL
  Filled 2018-03-25: qty 1

## 2018-03-25 MED ORDER — HYDROCODONE-ACETAMINOPHEN 5-325 MG PO TABS
1.0000 | ORAL_TABLET | ORAL | Status: DC | PRN
  Administered 2018-03-25 – 2018-03-26 (×3): 1 via ORAL
  Filled 2018-03-25 (×3): qty 1

## 2018-03-25 MED ORDER — PROPOFOL 10 MG/ML IV BOLUS
INTRAVENOUS | Status: AC
  Filled 2018-03-25: qty 60

## 2018-03-25 MED ORDER — ALBUMIN HUMAN 5 % IV SOLN
INTRAVENOUS | Status: AC
  Filled 2018-03-25: qty 250

## 2018-03-25 MED ORDER — METHOCARBAMOL 500 MG IVPB - SIMPLE MED
INTRAVENOUS | Status: AC
  Administered 2018-03-25: 500 mg
  Filled 2018-03-25: qty 50

## 2018-03-25 MED ORDER — ONDANSETRON HCL 4 MG/2ML IJ SOLN
INTRAMUSCULAR | Status: AC
  Filled 2018-03-25: qty 2

## 2018-03-25 MED ORDER — 0.9 % SODIUM CHLORIDE (POUR BTL) OPTIME
TOPICAL | Status: DC | PRN
  Administered 2018-03-25: 1000 mL

## 2018-03-25 MED ORDER — CHLORHEXIDINE GLUCONATE 4 % EX LIQD
60.0000 mL | Freq: Once | CUTANEOUS | Status: AC
  Administered 2018-03-25: 4 "application " via TOPICAL

## 2018-03-25 MED ORDER — TRANEXAMIC ACID 1000 MG/10ML IV SOLN
1000.0000 mg | Freq: Once | INTRAVENOUS | Status: AC
  Administered 2018-03-25: 1000 mg via INTRAVENOUS
  Filled 2018-03-25: qty 1000

## 2018-03-25 MED ORDER — METHOCARBAMOL 500 MG IVPB - SIMPLE MED
500.0000 mg | Freq: Four times a day (QID) | INTRAVENOUS | Status: DC | PRN
  Filled 2018-03-25: qty 50

## 2018-03-25 MED ORDER — LIDOCAINE 2% (20 MG/ML) 5 ML SYRINGE
INTRAMUSCULAR | Status: AC
  Filled 2018-03-25: qty 5

## 2018-03-25 MED ORDER — ALBUMIN HUMAN 5 % IV SOLN
INTRAVENOUS | Status: DC | PRN
  Administered 2018-03-25: 14:00:00 via INTRAVENOUS

## 2018-03-25 MED ORDER — CEFAZOLIN SODIUM-DEXTROSE 1-4 GM/50ML-% IV SOLN
1.0000 g | Freq: Four times a day (QID) | INTRAVENOUS | Status: AC
  Administered 2018-03-25 – 2018-03-26 (×2): 1 g via INTRAVENOUS
  Filled 2018-03-25 (×2): qty 50

## 2018-03-25 SURGICAL SUPPLY — 47 items
BAG DECANTER FOR FLEXI CONT (MISCELLANEOUS)
BAG SPEC THK2 15X12 ZIP CLS (MISCELLANEOUS)
BAG ZIPLOCK 12X15 (MISCELLANEOUS)
BLADE SAG 18X100X1.27 (BLADE) ×2
COVER PERINEAL POST (MISCELLANEOUS) ×2
COVER SURGICAL LIGHT HANDLE (MISCELLANEOUS) ×2
DECANTER SPIKE VIAL GLASS SM (MISCELLANEOUS)
DRAPE STERI IOBAN 125X83 (DRAPES) ×2
DRAPE U-SHAPE 47X51 STRL (DRAPES) ×4
DRSG ADAPTIC 3X8 NADH LF (GAUZE/BANDAGES/DRESSINGS) ×2
DRSG MEPILEX BORDER 4X4 (GAUZE/BANDAGES/DRESSINGS) ×2
DRSG MEPILEX BORDER 4X8 (GAUZE/BANDAGES/DRESSINGS) ×2
DURAPREP 26ML APPLICATOR (WOUND CARE) ×2
ELECT REM PT RETURN 15FT ADLT (MISCELLANEOUS) ×2
EVACUATOR 1/8 PVC DRAIN (DRAIN) ×2
FEM STEM 12/14 TAPER SZ 4 HIP (Orthopedic Implant) ×2 IMPLANT
GLOVE BIO SURGEON STRL SZ7 (GLOVE) ×2
GLOVE BIO SURGEON STRL SZ8 (GLOVE) ×4
GLOVE BIOGEL PI IND STRL 7.0 (GLOVE) ×2
GLOVE BIOGEL PI IND STRL 7.5 (GLOVE) ×2
GLOVE BIOGEL PI IND STRL 8 (GLOVE) ×1
GLOVE BIOGEL PI IND STRL 9 (GLOVE) ×1
GLOVE BIOGEL PI INDICATOR 7.0 (GLOVE) ×2
GLOVE BIOGEL PI INDICATOR 7.5 (GLOVE) ×2
GLOVE BIOGEL PI INDICATOR 8 (GLOVE) ×1
GLOVE BIOGEL PI INDICATOR 9 (GLOVE) ×1
GLOVE ECLIPSE 8.5 STRL (GLOVE) ×2
GLOVE SURG SS PI 7.0 STRL IVOR (GLOVE) ×2
GOWN SPEC L3 XXLG W/TWL (GOWN DISPOSABLE) ×2
GOWN SPEC L4 XLG W/TWL (GOWN DISPOSABLE) ×2
GOWN STRL REUS W/TWL LRG LVL3 (GOWN DISPOSABLE) ×2
GOWN STRL REUS W/TWL XL LVL3 (GOWN DISPOSABLE) ×2
HIP BALL CERAMIC (Hips) ×2 IMPLANT
HOLDER FOLEY CATH W/STRAP (MISCELLANEOUS) ×2
LINER MARATHON 32 50 (Hips) ×2 IMPLANT
MANIFOLD NEPTUNE II (INSTRUMENTS) ×2
PACK ANTERIOR HIP CUSTOM (KITS) ×2
PINNACLE SECTOR CUP 50MM (Hips) ×2 IMPLANT
STRIP CLOSURE SKIN 1/2X4 (GAUZE/BANDAGES/DRESSINGS) ×2
SUT ETHIBOND NAB CT1 #1 30IN (SUTURE) ×2
SUT MNCRL AB 4-0 PS2 18 (SUTURE) ×2
SUT STRATAFIX 0 PDS 27 VIOLET (SUTURE) ×2
SUT VIC AB 2-0 CT1 27 (SUTURE) ×4
SUT VIC AB 2-0 CT1 TAPERPNT 27 (SUTURE) ×2
SYR 50ML LL SCALE MARK (SYRINGE)
TRAY FOLEY MTR SLVR 16FR STAT (SET/KITS/TRAYS/PACK)
YANKAUER SUCT BULB TIP 10FT TU (MISCELLANEOUS) ×2

## 2018-03-25 NOTE — Plan of Care (Signed)
  Problem: Health Behavior/Discharge Planning: Goal: Ability to manage health-related needs will improve Outcome: Progressing   Problem: Activity: Goal: Risk for activity intolerance will decrease Outcome: Progressing   Problem: Nutrition: Goal: Adequate nutrition will be maintained Outcome: Progressing   Problem: Pain Managment: Goal: General experience of comfort will improve Outcome: Progressing   Problem: Education: Goal: Knowledge of General Education information will improve Description Including pain rating scale, medication(s)/side effects and non-pharmacologic comfort measures Outcome: Progressing

## 2018-03-25 NOTE — Interval H&P Note (Signed)
History and Physical Interval Note:  03/25/2018 11:39 AM  [name]  has presented today for surgery, with the diagnosis of left hip osteoarthritis  The various methods of treatment have been discussed with the patient and family. After consideration of risks, benefits and other options for treatment, the patient has consented to  Procedure(s): LEFT TOTAL HIP ARTHROPLASTY ANTERIOR APPROACH (Left) as a surgical intervention .  The patient's history has been reviewed, patient examined, no change in status, stable for surgery.  I have reviewed the patient's chart and labs.  Questions were answered to the patient's satisfaction.     Pilar Plate Amazin Pincock

## 2018-03-25 NOTE — Anesthesia Procedure Notes (Addendum)
Spinal  End time: 03/25/2018 1:11 PM Staffing Anesthesiologist: Belinda Block, MD Resident/CRNA: Maxwell Caul, CRNA Performed: resident/CRNA  Preanesthetic Checklist Completed: patient identified, site marked, surgical consent, pre-op evaluation, timeout performed, IV checked, risks and benefits discussed and monitors and equipment checked Spinal Block Patient position: sitting Prep: DuraPrep Patient monitoring: heart rate, continuous pulse ox and blood pressure Approach: midline Location: L3-4 Injection technique: single-shot Needle Needle type: Pencan  Needle gauge: 24 G Needle length: 10 cm Additional Notes Sitting for SAB placement. Patient tolerated well-negative heme/negative paresthesia. Dr Nyoka Cowden at bedside during entire placement. LOT # [id number]. Expiration Date 04-14-2019.

## 2018-03-25 NOTE — Anesthesia Preprocedure Evaluation (Addendum)
Anesthesia Evaluation  Patient identified by MRN, date of birth, ID band Patient awake    Reviewed: Allergy & Precautions, NPO status , Patient's Chart, lab work & pertinent test results  Airway Mallampati: II  TM Distance: >3 FB     Dental   Pulmonary neg pulmonary ROS,    breath sounds clear to auscultation       Cardiovascular negative cardio ROS   Rhythm:Regular Rate:Normal     Neuro/Psych  Neuromuscular disease    GI/Hepatic Neg liver ROS, GERD  ,  Endo/Other  negative endocrine ROS  Renal/GU negative Renal ROS     Musculoskeletal  (+) Arthritis ,   Abdominal   Peds  Hematology   Anesthesia Other Findings   Reproductive/Obstetrics                             Anesthesia Physical Anesthesia Plan  ASA: III  Anesthesia Plan: Spinal   Post-op Pain Management:    Induction: Intravenous  PONV Risk Score and Plan: Ondansetron, Dexamethasone, Midazolam and Treatment may vary due to age or medical condition  Airway Management Planned: Simple Face Mask and Nasal Cannula  Additional Equipment:   Intra-op Plan:   Post-operative Plan:   Informed Consent: I have reviewed the patients History and Physical, chart, labs and discussed the procedure including the risks, benefits and alternatives for the proposed anesthesia with the patient or authorized representative who has indicated his/her understanding and acceptance.   Dental advisory given  Plan Discussed with: CRNA and Anesthesiologist  Anesthesia Plan Comments:         Anesthesia Quick Evaluation

## 2018-03-25 NOTE — Plan of Care (Signed)
Pt is stable. Pt educated on pain management. Pain management in progress, tolerating well at this time, effective.

## 2018-03-25 NOTE — Transfer of Care (Signed)
Immediate Anesthesia Transfer of Care Note  Patient: [name]  Procedure(s) Performed: LEFT TOTAL HIP ARTHROPLASTY ANTERIOR APPROACH (Left Hip)  Patient Location: PACU  Anesthesia Type:Spinal  Level of Consciousness: awake, alert  and oriented  Airway & Oxygen Therapy: Patient Spontanous Breathing and Patient connected to face mask oxygen  Post-op Assessment: Report given to RN and Post -op Vital signs reviewed and stable  Post vital signs: Reviewed and stable  Last Vitals:  Vitals Value Taken Time  BP 93/57 03/25/2018  3:00 PM  Temp    Pulse 61 03/25/2018  3:01 PM  Resp 15 03/25/2018  3:01 PM  SpO2 100 % 03/25/2018  3:01 PM  Vitals shown include unvalidated device data.  Last Pain:  Vitals:   03/25/18 1131  TempSrc: Oral  PainSc: 0-No pain         Complications: No apparent anesthesia complications

## 2018-03-25 NOTE — Op Note (Signed)
OPERATIVE REPORT- TOTAL HIP ARTHROPLASTY   PREOPERATIVE DIAGNOSIS: Osteoarthritis of the Left hip.   POSTOPERATIVE DIAGNOSIS: Osteoarthritis of the Left  hip.   PROCEDURE: Left total hip arthroplasty, anterior approach.   SURGEON: Gaynelle Arabian, MD   ASSISTANT: Theresa Duty, PA-C  ANESTHESIA:  Spinal  ESTIMATED BLOOD LOSS:-700 mL    DRAINS: Hemovac x1.   COMPLICATIONS: None   CONDITION: PACU - hemodynamically stable.   BRIEF CLINICAL NOTE: [name] is a 72 y.o. female who has advanced end-  stage arthritis of their Left  hip with progressively worsening pain and  dysfunction.The patient has failed nonoperative management and presents for  total hip arthroplasty.   PROCEDURE IN DETAIL: After successful administration of spinal  anesthetic, the traction boots for the [facility] bed were placed on both  feet and the patient was placed onto the [facility] bed, boots placed into the leg  holders. The Left hip was then isolated from the perineum with plastic  drapes and prepped and draped in the usual sterile fashion. ASIS and  greater trochanter were marked and a oblique incision was made, starting  at about 1 cm lateral and 2 cm distal to the ASIS and coursing towards  the anterior cortex of the femur. The skin was cut with a 10 blade  through subcutaneous tissue to the level of the fascia overlying the  tensor fascia lata muscle. The fascia was then incised in line with the  incision at the junction of the anterior third and posterior 2/3rd. The  muscle was teased off the fascia and then the interval between the TFL  and the rectus was developed. The Hohmann retractor was then placed at  the top of the femoral neck over the capsule. The vessels overlying the  capsule were cauterized and the fat on top of the capsule was removed.  A Hohmann retractor was then placed anterior underneath the rectus  femoris to give exposure to the entire anterior capsule. A T-shaped   capsulotomy was performed. The edges were tagged and the femoral head  was identified.       Osteophytes are removed off the superior acetabulum.  The femoral neck was then cut in situ with an oscillating saw. Traction  was then applied to the left lower extremity utilizing the [facility]  traction. The femoral head was then removed. Retractors were placed  around the acetabulum and then circumferential removal of the labrum was  performed. Osteophytes were also removed. Reaming starts at 47 mm to  medialize and  Increased in 2 mm increments to 49 mm. We reamed in  approximately 40 degrees of abduction, 20 degrees anteversion. A 50 mm  pinnacle acetabular shell was then impacted in anatomic position under  fluoroscopic guidance with excellent purchase. We did not need to place  any additional dome screws. A 32 mm neutral + 4 marathon liner was then  placed into the acetabular shell.       The femoral lift was then placed along the lateral aspect of the femur  just distal to the vastus ridge. The leg was  externally rotated and capsule  was stripped off the inferior aspect of the femoral neck down to the  level of the lesser trochanter, this was done with electrocautery. The femur was lifted after this was performed. The  leg was then placed in an extended and adducted position essentially delivering the femur. We also removed the capsule superiorly and the piriformis from the piriformis  fossa to gain excellent exposure of the  proximal femur. Rongeur was used to remove some cancellous bone to get  into the lateral portion of the proximal femur for placement of the  initial starter reamer. The starter broaches was placed  the starter broach  and was shown to go down the center of the canal. Broaching  with the Actis system was then performed starting at size 0  coursing  Up to size 4. A size 4 had excellent torsional and rotational  and axial stability. The trial high offset neck was then placed   with a 32 + 5 trial head. The hip was then reduced. We confirmed that  the stem was in the canal both on AP and lateral x-rays. It also has excellent sizing. The hip was reduced with outstanding stability through full extension and full external rotation.. AP pelvis was taken and the leg lengths were measured and found to be equal. Hip was then dislocated again and the femoral head and neck removed. The  femoral broach was removed. Size 4 Actis stem with a high offset  neck was then impacted into the femur following native anteversion. Has  excellent purchase in the canal. Excellent torsional and rotational and  axial stability. It is confirmed to be in the canal on AP and lateral  fluoroscopic views. The 32 + 5 ceramic head was placed and the hip  reduced with outstanding stability. Again AP pelvis was taken and it  confirmed that the leg lengths were equal. The wound was then copiously  irrigated with saline solution and the capsule reattached and repaired  with Ethibond suture. 30 ml of .25% Bupivicaine was  injected into the capsule and into the edge of the tensor fascia lata as well as subcutaneous tissue. The fascia overlying the tensor fascia lata was then closed with a running #1 V-Loc. Subcu was closed with interrupted 2-0 Vicryl and subcuticular running 4-0 Monocryl. Incision was cleaned  and dried. Steri-Strips and a bulky sterile dressing applied. Hemovac  drain was hooked to suction and then the patient was awakened and transported to  recovery in stable condition.        Please note that a surgical assistant was a medical necessity for this procedure to perform it in a safe and expeditious manner. Assistant was necessary to provide appropriate retraction of vital neurovascular structures and to prevent femoral fracture and allow for anatomic placement of the prosthesis.  Gaynelle Arabian, M.D.

## 2018-03-25 NOTE — Anesthesia Postprocedure Evaluation (Signed)
Anesthesia Post Note  Patient: [name]  Procedure(s) Performed: LEFT TOTAL HIP ARTHROPLASTY ANTERIOR APPROACH (Left Hip)     Patient location during evaluation: PACU Anesthesia Type: Spinal Level of consciousness: awake Pain management: pain level controlled Vital Signs Assessment: post-procedure vital signs reviewed and stable Respiratory status: spontaneous breathing Cardiovascular status: stable Anesthetic complications: no    Last Vitals:  Vitals:   03/25/18 1530 03/25/18 1545  BP: 123/84 127/73  Pulse: 65 67  Resp: 11 11  Temp: (!) 36.1 C   SpO2: 100% 100%    Last Pain:  Vitals:   03/25/18 1545  TempSrc:   PainSc: 5                  [name]

## 2018-03-26 ENCOUNTER — Encounter (HOSPITAL_COMMUNITY): Payer: Self-pay | Admitting: Orthopedic Surgery

## 2018-03-26 LAB — BASIC METABOLIC PANEL WITH GFR
Anion gap: 6 (ref 5–15)
BUN: 7 mg/dL — ABNORMAL LOW (ref 8–23)
CO2: 27 mmol/L (ref 22–32)
Calcium: 8.6 mg/dL — ABNORMAL LOW (ref 8.9–10.3)
Chloride: 109 mmol/L (ref 98–111)
Creatinine, Ser: 0.69 mg/dL (ref 0.44–1.00)
GFR calc Af Amer: >60 mL/min
GFR calc non Af Amer: >60 mL/min
Glucose, Bld: 162 mg/dL — ABNORMAL HIGH (ref 70–99)
Potassium: 4.2 mmol/L (ref 3.5–5.1)
Sodium: 142 mmol/L (ref 135–145)

## 2018-03-26 LAB — CBC
HCT: 30.2 % — ABNORMAL LOW (ref 36.0–46.0)
Hemoglobin: 10.1 g/dL — ABNORMAL LOW (ref 12.0–15.0)
MCH: 31.7 pg (ref 26.0–34.0)
MCHC: 33.4 g/dL (ref 30.0–36.0)
MCV: 94.7 fL (ref 78.0–100.0)
Platelets: 209 10*3/uL (ref 150–400)
RBC: 3.19 MIL/uL — ABNORMAL LOW (ref 3.87–5.11)
RDW: 13.1 % (ref 11.5–15.5)
WBC: 13.2 10*3/uL — ABNORMAL HIGH (ref 4.0–10.5)

## 2018-03-26 MED ORDER — SODIUM CHLORIDE 0.9 % IV BOLUS
250.0000 mL | Freq: Once | INTRAVENOUS | Status: AC
  Administered 2018-03-26: 250 mL via INTRAVENOUS

## 2018-03-26 MED ORDER — TRAMADOL HCL 50 MG PO TABS: 50.0000 mg | ORAL_TABLET | Freq: Four times a day (QID) | ORAL | 0 refills | Status: DC | PRN

## 2018-03-26 MED ORDER — HYDROCODONE-ACETAMINOPHEN 5-325 MG PO TABS: 1.0000 | ORAL_TABLET | Freq: Four times a day (QID) | ORAL | 0 refills | Status: DC | PRN

## 2018-03-26 MED ORDER — METHOCARBAMOL 500 MG PO TABS: 500.0000 mg | ORAL_TABLET | Freq: Four times a day (QID) | ORAL | 0 refills | Status: DC | PRN

## 2018-03-26 MED ORDER — ASPIRIN 325 MG PO TBEC: 325.0000 mg | DELAYED_RELEASE_TABLET | Freq: Two times a day (BID) | ORAL | 0 refills | Status: AC

## 2018-03-26 NOTE — Evaluation (Signed)
Physical Therapy Evaluation Patient Details Name: [name] MRN: [id number] DOB: [birth year] Today's Date: 03/26/2018   History of Present Illness  Pt s/p L THR and with hx of L TKR.  Pt legally blind in L eye  Clinical Impression  Pt s/p L THR and presents with decreased L LE strength/ROM and post op pain limiting functional mobility.  Pt shoulf progress to dc home with family assist.    Follow Up Recommendations Follow surgeon's recommendation for DC plan and follow-up therapies    Equipment Recommendations  None recommended by PT    Recommendations for Other Services       Precautions / Restrictions Precautions Precautions: Fall Restrictions Weight Bearing Restrictions: No Other Position/Activity Restrictions: WBAT      Mobility  Bed Mobility Overal bed mobility: Needs Assistance Bed Mobility: Supine to Sit     Supine to sit: Min assist     General bed mobility comments: cues for sequence and use of R LE to self assist  Transfers Overall transfer level: Needs assistance Equipment used: Rolling walker (2 wheeled) Transfers: Sit to/from Stand Sit to Stand: Min assist         General transfer comment: cues for LE management and use of UEs to self assist  Ambulation/Gait Ambulation/Gait assistance: Min assist Gait Distance (Feet): 180 Feet Assistive device: Rolling walker (2 wheeled) Gait Pattern/deviations: Step-to pattern;Step-through pattern;Decreased step length - right;Decreased step length - left;Shuffle;Trunk flexed Gait velocity: decr   General Gait Details: cues for posture, position from RW and initial sequence  Stairs            Wheelchair Mobility    Modified Rankin (Stroke Patients Only)       Balance Overall balance assessment: Mild deficits observed, not formally tested                                           Pertinent Vitals/Pain Pain Assessment: 0-10 Pain Score: 4  Pain Location: L hip Pain  Descriptors / Indicators: Aching;Sore Pain Intervention(s): Limited activity within patient's tolerance;Monitored during session;Premedicated before session;Ice applied    Home Living Family/patient expects to be discharged to:: Private residence Living Arrangements: Spouse/significant other Available Help at Discharge: Family Type of Home: House Home Access: Stairs to enter Entrance Stairs-Rails: Psychiatric nurse of Steps: 3 Home Layout: One level Home Equipment: Environmental consultant - 2 wheels;Cane - single point;Bedside commode      Prior Function Level of Independence: Independent               Hand Dominance        Extremity/Trunk Assessment   Upper Extremity Assessment Upper Extremity Assessment: Overall WFL for tasks assessed    Lower Extremity Assessment Lower Extremity Assessment: LLE deficits/detail LLE Deficits / Details: Strength at hip 2+/5 with AAROM at hip to 85 flex and 20 abd       Communication   Communication: No difficulties  Cognition Arousal/Alertness: Awake/alert Behavior During Therapy: WFL for tasks assessed/performed Overall Cognitive Status: Within Functional Limits for tasks assessed                                        General Comments      Exercises Total Joint Exercises Ankle Circles/Pumps: AROM;Both;20 reps;Supine Quad Sets: AROM;Both;10 reps;Supine Heel Slides: AAROM;20  reps;Supine;Left Hip ABduction/ADduction: AAROM;Left;15 reps;Supine   Assessment/Plan    PT Assessment Patient needs continued PT services  PT Problem List Decreased strength;Decreased range of motion;Decreased activity tolerance;Decreased mobility;Decreased knowledge of use of DME;Pain       PT Treatment Interventions DME instruction;Gait training;Stair training;Functional mobility training;Therapeutic activities;Therapeutic exercise;Patient/family education    PT Goals (Current goals can be found in the Care Plan section)  Acute  Rehab PT Goals Patient Stated Goal: Regain IND PT Goal Formulation: With patient Time For Goal Achievement: 04/02/18 Potential to Achieve Goals: Good    Frequency 7X/week   Barriers to discharge        Co-evaluation               AM-PAC PT "6 Clicks" Daily Activity  Outcome Measure Difficulty turning over in bed (including adjusting bedclothes, sheets and blankets)?: Unable Difficulty moving from lying on back to sitting on the side of the bed? : Unable Difficulty sitting down on and standing up from a chair with arms (e.g., wheelchair, bedside commode, etc,.)?: Unable Help needed moving to and from a bed to chair (including a wheelchair)?: A Little Help needed walking in hospital room?: A Little Help needed climbing 3-5 steps with a railing? : A Little 6 Click Score: 12    End of Session Equipment Utilized During Treatment: Gait belt Activity Tolerance: Patient tolerated treatment well Patient left: with call bell/phone within reach;in chair Nurse Communication: Mobility status PT Visit Diagnosis: Difficulty in walking, not elsewhere classified (R26.2)    Time: 9357-0177 PT Time Calculation (min) (ACUTE ONLY): 42 min   Charges:   PT Evaluation $PT Eval Low Complexity: 1 Low PT Treatments $Gait Training: 8-22 mins $Therapeutic Exercise: 8-22 mins        Central Pager [phone] Office [phone]   Laniyah Rosenwald 03/26/2018, 12:16 PM

## 2018-03-26 NOTE — Care Management Note (Signed)
Case Management Note  Patient Details  Name: AMBERLEE GARVEY MRN: [id number] Date of Birth: 1946/05/10  Subjective/Objective: From home w/spouse. D/c plan HEP. No further CM needs.                  Action/Plan:d/c home.   Expected Discharge Date:  03/26/18               Expected Discharge Plan:  Home/Self Care  In-House Referral:     Discharge planning Services  CM Consult  Post Acute Care Choice:  (3n1,rw,transport chair) Choice offered to:     DME Arranged:    DME Agency:     HH Arranged:    HH Agency:     Status of Service:  Completed, signed off  If discussed at [employer] of Stay Meetings, dates discussed:    Additional Comments:  Dessa Phi, RN 03/26/2018, 11:20 AM

## 2018-03-26 NOTE — Progress Notes (Signed)
Physical Therapy Treatment Patient Details Name: [name] MRN: [id number] DOB: 24-May-1946 Today's Date: 03/26/2018    History of Present Illness Pt s/p L THR and with hx of L TKR.  Pt legally blind in L eye    PT Comments    Pt motivated and progressing well with mobility.  Pt reviewed car transfers, stairs and home therex program with progression and written instruction provided.    Follow Up Recommendations  Follow surgeon's recommendation for DC plan and follow-up therapies     Equipment Recommendations  None recommended by PT    Recommendations for Other Services       Precautions / Restrictions Precautions Precautions: Fall Restrictions Weight Bearing Restrictions: No Other Position/Activity Restrictions: WBAT    Mobility  Bed Mobility Overal bed mobility: Needs Assistance Bed Mobility: Supine to Sit     Supine to sit: Min assist     General bed mobility comments: NT - Pt up in chair and requests back to same  Transfers Overall transfer level: Needs assistance Equipment used: Rolling walker (2 wheeled) Transfers: Sit to/from Stand Sit to Stand: Min guard         General transfer comment: cues for LE management and use of UEs to self assist  Ambulation/Gait Ambulation/Gait assistance: Min guard Gait Distance (Feet): 120 Feet Assistive device: Rolling walker (2 wheeled) Gait Pattern/deviations: Step-to pattern;Step-through pattern;Decreased step length - right;Decreased step length - left;Shuffle;Trunk flexed Gait velocity: decr   General Gait Details: cues for posture, position from RW and initial sequence   Stairs Stairs: Yes Stairs assistance: Min assist Stair Management: Two rails;Forwards;Step to pattern Number of Stairs: 5 General stair comments: cues for sequence and foot placement   Wheelchair Mobility    Modified Rankin (Stroke Patients Only)       Balance Overall balance assessment: Mild deficits observed, not formally  tested                                          Cognition Arousal/Alertness: Awake/alert Behavior During Therapy: WFL for tasks assessed/performed Overall Cognitive Status: Within Functional Limits for tasks assessed                                        Exercises Total Joint Exercises Ankle Circles/Pumps: AROM;Both;20 reps;Supine Quad Sets: AROM;Both;10 reps;Supine Heel Slides: AAROM;20 reps;Supine;Left Hip ABduction/ADduction: AAROM;Left;15 reps;Supine Long Arc Quad: AAROM;AROM;Left;15 reps;Seated    General Comments        Pertinent Vitals/Pain Pain Assessment: 0-10 Pain Score: 4  Pain Location: L hip Pain Descriptors / Indicators: Aching;Sore Pain Intervention(s): Limited activity within patient's tolerance;Monitored during session;Premedicated before session;Ice applied    Home Living Family/patient expects to be discharged to:: Private residence Living Arrangements: Spouse/significant other Available Help at Discharge: Family Type of Home: House Home Access: Stairs to enter Entrance Stairs-Rails: Right;Left Home Layout: One level Home Equipment: Environmental consultant - 2 wheels;Cane - single point;Bedside commode      Prior Function Level of Independence: Independent          PT Goals (current goals can now be found in the care plan section) Acute Rehab PT Goals Patient Stated Goal: Regain IND PT Goal Formulation: With patient Time For Goal Achievement: 04/02/18 Potential to Achieve Goals: Good Progress towards PT goals: Progressing toward goals  Frequency    7X/week      PT Plan Current plan remains appropriate    Co-evaluation              AM-PAC PT "6 Clicks" Daily Activity  Outcome Measure  Difficulty turning over in bed (including adjusting bedclothes, sheets and blankets)?: Unable Difficulty moving from lying on back to sitting on the side of the bed? : Unable Difficulty sitting down on and standing up from a  chair with arms (e.g., wheelchair, bedside commode, etc,.)?: Unable Help needed moving to and from a bed to chair (including a wheelchair)?: A Little Help needed walking in hospital room?: A Little Help needed climbing 3-5 steps with a railing? : A Little 6 Click Score: 12    End of Session Equipment Utilized During Treatment: Gait belt Activity Tolerance: Patient tolerated treatment well Patient left: with call bell/phone within reach;in chair Nurse Communication: Mobility status PT Visit Diagnosis: Difficulty in walking, not elsewhere classified (R26.2)     Time: 8638-1771 PT Time Calculation (min) (ACUTE ONLY): 36 min  Charges:  $Gait Training: 8-22 mins $Therapeutic Exercise: 8-22 mins                     [place] Pager [phone] Office [phone]    Rylee Huestis 03/26/2018, 2:51 PM

## 2018-03-26 NOTE — Progress Notes (Signed)
   Subjective: 1 Day Post-Op Procedure(s) (LRB): LEFT TOTAL HIP ARTHROPLASTY ANTERIOR APPROACH (Left) Patient reports pain as mild.   Patient seen in rounds with Dr. Wynelle Link. Patient is well, but has had some minor complaints of lightheadedness when she tried to ambulate in the room yesterday. No issues overnight. States she is ready to go home if her dizziness improves. Denies chest pain or SOB. Foley catheter removed this AM.  We will start therapy today.   Objective: Vital signs in last 24 hours: Temp:  [96.8 F (36 C)-98.4 F (36.9 C)] 98.3 F (36.8 C) (09/12 0500) Pulse Rate:  [57-86] 66 (09/12 0500) Resp:  [10-17] 16 (09/11 2128) BP: (88-131)/(53-84) 98/55 (09/12 0500) SpO2:  [97 %-100 %] 99 % (09/12 0500) Weight:  [59 kg] 59 kg (09/11 1131)  Intake/Output from previous day:  Intake/Output Summary (Last 24 hours) at 03/26/2018 0731 Last data filed at 03/26/2018 8413 Gross per 24 hour  Intake 5317.94 ml  Output 4390 ml  Net 927.94 ml    Labs: Recent Labs    03/26/18 0435  HGB 10.1*   Recent Labs    03/26/18 0435  WBC 13.2*  RBC 3.19*  HCT 30.2*  PLT 209   Recent Labs    03/26/18 0435  NA 142  K 4.2  CL 109  CO2 27  BUN 7*  CREATININE 0.69  GLUCOSE 162*  CALCIUM 8.6*   Exam: General - Patient is Alert and Oriented Extremity - Neurologically intact Neurovascular intact Sensation intact distally Dorsiflexion/Plantar flexion intact Dressing - dressing C/D/I Motor Function - intact, moving foot and toes well on exam.   Past Medical History:  Diagnosis Date  . Allergy   . Arthritis    oa  . Cataract of both eyes   . Cervical dysplasia   . Diarrhea    off and on last 3 months 1 episode today  . Dry eyes   . Fluid retention    slight leg swelling at times  . GERD (gastroesophageal reflux disease)   . Legally blind in left eye, as defined in [place]    since birth   . Low back pain   . Lumbar herniated disc   . Numbness and tingling    feet  bilat   . Osteopenia 06/2015    T score -1.7  . Syncope    none recent  . Urinary frequency   . Urinary hesitancy   . Wears glasses     Assessment/Plan: 1 Day Post-Op Procedure(s) (LRB): LEFT TOTAL HIP ARTHROPLASTY ANTERIOR APPROACH (Left) Principal Problem:   OA (osteoarthritis) of hip  Estimated body mass index is 22.31 kg/m as calculated from the following:   Height as of this encounter: 5\' 4"  (1.626 m).   Weight as of this encounter: 59 kg. Advance diet Up with therapy  DVT Prophylaxis - Aspirin Weight bearing as tolerated. D/C O2 and pulse ox and try on room air. Hemovac pulled without difficulty, will begin therapy.  Hemoglobin 10.1. Will order 250 mL bolus to help with lightheadedness.  Plan is to go Home with HEP after hospital stay. If she progresses with therapy, is meeting her goals and is stable, she may discharge home today. Follow-up in the office in 2 weeks with Dr. Wynelle Link.   Theresa Duty, PA-C Orthopedic Surgery 03/26/2018, 7:31 AM

## 2018-03-26 NOTE — Progress Notes (Signed)
Pt was provided with discharge instructions and prescriptions. After discussing the pt's plan of care upon discharge, the pt reported no further questions or concerns.

## 2018-03-30 NOTE — Discharge Summary (Signed)
Physician Discharge Summary   Patient ID: [name] MRN: [id number] DOB/AGE: 73/16/1947 73 y.o.  Admit date: 03/25/2018 Discharge date: 03/26/2018  Primary Diagnosis: Osteoarthritis, left hip   Admission Diagnoses:  Past Medical History:  Diagnosis Date  . Allergy   . Arthritis    oa  . Cataract of both eyes   . Cervical dysplasia   . Diarrhea    off and on last 3 months 1 episode today  . Dry eyes   . Fluid retention    slight leg swelling at times  . GERD (gastroesophageal reflux disease)   . Legally blind in left eye, as defined in [place]    since birth   . Low back pain   . Lumbar herniated disc   . Numbness and tingling    feet bilat   . Osteopenia 06/2015    T score -1.7  . Syncope    none recent  . Urinary frequency   . Urinary hesitancy   . Wears glasses    Discharge Diagnoses:   Principal Problem:   OA (osteoarthritis) of hip  Estimated body mass index is 22.31 kg/m as calculated from the following:   Height as of this encounter: '5\' 4"'$  (1.626 m).   Weight as of this encounter: 59 kg.  Procedure:  Procedure(s) (LRB): LEFT TOTAL HIP ARTHROPLASTY ANTERIOR APPROACH (Left)   Consults: None  HPI: [name] is a 72 y.o. female who has advanced end-stage arthritis of their Left  hip with progressively worsening pain and dysfunction.The patient has failed nonoperative management and presents for total hip arthroplasty.   Laboratory Data: Admission on 03/25/2018, Discharged on 03/26/2018  Component Date Value Ref Range Status  . WBC 03/26/2018 13.2* 4.0 - 10.5 K/uL Final  . RBC 03/26/2018 3.19* 3.87 - 5.11 MIL/uL Final  . Hemoglobin 03/26/2018 10.1* 12.0 - 15.0 g/dL Final  . HCT 03/26/2018 30.2* 36.0 - 46.0 % Final  . MCV 03/26/2018 94.7  78.0 - 100.0 fL Final  . MCH 03/26/2018 31.7  26.0 - 34.0 pg Final  . MCHC 03/26/2018 33.4  30.0 - 36.0 g/dL Final  . RDW 03/26/2018 13.1  11.5 - 15.5 % Final  . Platelets 03/26/2018 209  150 - 400 K/uL Final     Performed at [facility], [place] [address] [place], [place] [address]  . Sodium 03/26/2018 142  135 - 145 mmol/L Final  . Potassium 03/26/2018 4.2  3.5 - 5.1 mmol/L Final  . Chloride 03/26/2018 109  98 - 111 mmol/L Final  . CO2 03/26/2018 27  22 - 32 mmol/L Final  . Glucose, Bld 03/26/2018 162* 70 - 99 mg/dL Final  . BUN 03/26/2018 7* 8 - 23 mg/dL Final  . Creatinine, Ser 03/26/2018 0.69  0.44 - 1.00 mg/dL Final  . Calcium 03/26/2018 8.6* 8.9 - 10.3 mg/dL Final  . GFR calc non Af Amer 03/26/2018 >60  >60 mL/min Final  . GFR calc Af Amer 03/26/2018 >60  >60 mL/min Final   Comment: (NOTE) The eGFR has been calculated using the CKD EPI equation. This calculation has not been validated in all clinical situations. eGFR's persistently <60 mL/min signify possible Chronic Kidney Disease.   Georgiann Hahn gap 03/26/2018 6  5 - 15 Final   Performed at [facility], [place] [address] [place], [place] [address]  Hospital Outpatient Visit on 03/20/2018  Component Date Value Ref Range Status  . aPTT 03/20/2018 28  24 - 36 seconds Final  Performed at [facility], [place] [address] [place], [place] [address]  . WBC 03/20/2018 7.0  4.0 - 10.5 K/uL Final  . RBC 03/20/2018 4.41  3.87 - 5.11 MIL/uL Final  . Hemoglobin 03/20/2018 13.8  12.0 - 15.0 g/dL Final  . HCT 03/20/2018 41.7  36.0 - 46.0 % Final  . MCV 03/20/2018 94.6  78.0 - 100.0 fL Final  . MCH 03/20/2018 31.3  26.0 - 34.0 pg Final  . MCHC 03/20/2018 33.1  30.0 - 36.0 g/dL Final  . RDW 03/20/2018 13.2  11.5 - 15.5 % Final  . Platelets 03/20/2018 272  150 - 400 K/uL Final   Performed at [facility], [place] [address] [place], [place] [address]  . Sodium 03/20/2018 142  135 - 145 mmol/L Final  . Potassium 03/20/2018 5.3* 3.5 - 5.1 mmol/L Final  . Chloride 03/20/2018 107  98 - 111 mmol/L Final  . CO2 03/20/2018 28  22 - 32 mmol/L Final  . Glucose, Bld 03/20/2018 92  70 - 99  mg/dL Final  . BUN 03/20/2018 18  8 - 23 mg/dL Final  . Creatinine, Ser 03/20/2018 0.71  0.44 - 1.00 mg/dL Final  . Calcium 03/20/2018 9.2  8.9 - 10.3 mg/dL Final  . Total Protein 03/20/2018 7.0  6.5 - 8.1 g/dL Final  . Albumin 03/20/2018 3.9  3.5 - 5.0 g/dL Final  . AST 03/20/2018 27  15 - 41 U/L Final  . ALT 03/20/2018 24  0 - 44 U/L Final  . Alkaline Phosphatase 03/20/2018 64  38 - 126 U/L Final  . Total Bilirubin 03/20/2018 0.7  0.3 - 1.2 mg/dL Final  . GFR calc non Af Amer 03/20/2018 >60  >60 mL/min Final  . GFR calc Af Amer 03/20/2018 >60  >60 mL/min Final   Comment: (NOTE) The eGFR has been calculated using the CKD EPI equation. This calculation has not been validated in all clinical situations. eGFR's persistently <60 mL/min signify possible Chronic Kidney Disease.   Georgiann Hahn gap 03/20/2018 7  5 - 15 Final   Performed at [facility], [place] [address] [place], [place] [address]  . Prothrombin Time 03/20/2018 12.0  11.4 - 15.2 seconds Final  . INR 03/20/2018 0.90   Final   Performed at [place] [address] [place], [place] [address]  . ABO/RH(D) 03/20/2018 O POS   Final  . Antibody Screen 03/20/2018 NEG   Final  . Sample Expiration 03/20/2018 03/28/2018   Final  . Extend sample reason 03/20/2018    Final                   Value:NO TRANSFUSIONS OR PREGNANCY IN THE PAST 3 MONTHS Performed at [facility], [place] [address], [place], [place] [address]   . MRSA, PCR 03/20/2018 NEGATIVE  NEGATIVE Final  . Staphylococcus aureus 03/20/2018 NEGATIVE  NEGATIVE Final   Comment: (NOTE) The Xpert SA Assay (FDA approved for NASAL specimens in patients 41 years of age and older), is one component of a comprehensive surveillance program. It is not intended to diagnose infection nor to guide or monitor treatment. Performed at [facility], [place] [address] [place], [place] [address]      X-Rays:Dg Pelvis  Portable  Result Date: 03/25/2018 CLINICAL DATA:  Left hip replacement EXAM: PORTABLE PELVIS 1-2 VIEWS COMPARISON:  None. FINDINGS: Left total hip arthroplasty is anatomically aligned. No breakage or loosening of the hardware. No  acute fracture. IMPRESSION: Left total hip arthroplasty anatomically aligned. Electronically Signed   By: Marybelle Killings M.D.   On: 03/25/2018 17:31   Dg C-arm 1-60 Min-no Report  Result Date: 03/25/2018 Fluoroscopy was utilized by the requesting physician.  No radiographic interpretation.    EKG: Orders placed or performed during the hospital encounter of 07/25/15  . EKG 12 lead  . EKG 12 lead     Hospital Course: [name] is a 72 y.o. who was admitted to [facility]. They were brought to the operating room on 03/25/2018 and underwent Procedure(s): LEFT TOTAL HIP ARTHROPLASTY ANTERIOR APPROACH.  Patient tolerated the procedure well and was later transferred to the recovery room and then to the orthopaedic floor for postoperative care. They were given PO and IV analgesics for pain control following their surgery. They were given 24 hours of postoperative antibiotics of  Anti-infectives (From admission, onward)   Start     Dose/Rate Route Frequency Ordered Stop   03/25/18 1930  ceFAZolin (ANCEF) IVPB 1 g/50 mL premix     1 g 100 mL/hr over 30 Minutes Intravenous Every 6 hours 03/25/18 1633 03/26/18 0109   03/25/18 1115  ceFAZolin (ANCEF) IVPB 2g/100 mL premix     2 g 200 mL/hr over 30 Minutes Intravenous On call to O.R. 03/25/18 1112 03/25/18 1327     and started on DVT prophylaxis in the form of Aspirin.   PT and OT were ordered for total joint protocol. Discharge planning consulted to help with postop disposition and equipment needs.  Patient had a decent night on the evening of surgery. They started to get up OOB with therapy on POD #1. Pt was seen during rounds and was ready to go home pending progress with therapy. She had some minor complaints of  dizziness the night before, and a 250 mL NaCl bolus was ordered to improve intravascular volume. Hemovac drain was pulled without difficulty. She worked with therapy on POD #1 and was meeting her goals with no further issues or complaints of dizziness. Pt was discharged to home later that day in stable condition.  Diet: Regular diet Activity: WBAT Follow-up: in 2 weeks with Dr. Wynelle Link Disposition: Home with HEP Discharged Condition: stable   Discharge Instructions    Call MD / Call 911   Complete by:  As directed    If you experience chest pain or shortness of breath, CALL 911 and be transported to the hospital emergency room.  If you develope a fever above 101 F, pus (white drainage) or increased drainage or redness at the wound, or calf pain, call your surgeon's office.   Change dressing   Complete by:  As directed    You may change your dressing on Friday (09/13), then change the dressing daily with sterile 4 x 4 inch gauze dressing and paper tape.   Constipation Prevention   Complete by:  As directed    Drink plenty of fluids.  Prune juice may be helpful.  You may use a stool softener, such as Colace (over the counter) 100 mg twice a day.  Use MiraLax (over the counter) for constipation as needed.   Diet - low sodium heart healthy   Complete by:  As directed    Discharge instructions   Complete by:  As directed    Dr. Gaynelle Arabian Total Joint Specialist [facility] [address], [address] [place], [place] [address] [phone]  [place]  Hip Rehabilitation, Guidelines Following Surgery  The results of a hip operation are greatly improved after range of motion and muscle strengthening exercises. Follow all safety measures which are given to protect your hip. If any of these exercises cause increased pain or swelling in your joint, decrease the amount until you are comfortable again. Then slowly increase the  exercises. Call your caregiver if you have problems or questions.   HOME CARE INSTRUCTIONS  Remove items at home which could result in a fall. This includes throw rugs or furniture in walking pathways.  ICE to the affected hip every three hours for 30 minutes at a time and then as needed for pain and swelling.  Continue to use ice on the hip for pain and swelling from surgery. You may notice swelling that will progress down to the foot and ankle.  This is normal after surgery.  Elevate the leg when you are not up walking on it.   Continue to use the breathing machine which will help keep your temperature down.  It is common for your temperature to cycle up and down following surgery, especially at night when you are not up moving around and exerting yourself.  The breathing machine keeps your lungs expanded and your temperature down.  DIET You may resume your previous home diet once your are discharged from the hospital.  DRESSING / WOUND CARE / SHOWERING You may shower 3 days after surgery, but keep the wounds dry during showering.  You may use an occlusive plastic wrap (Press'n Seal for example), NO SOAKING/SUBMERGING IN THE BATHTUB.  If the bandage gets wet, change with a clean dry gauze.  If the incision gets wet, pat the wound dry with a clean towel. You may start showering once you are discharged home but do not submerge the incision under water. Just pat the incision dry and apply a dry gauze dressing on daily. Change the surgical dressing daily and reapply a dry dressing each time.  ACTIVITY Walk with your walker as instructed. Use walker as long as suggested by your caregivers. Avoid periods of inactivity such as sitting longer than an hour when not asleep. This helps prevent blood clots.  You may resume a sexual relationship in one month or when given the OK by your doctor.  You may return to work once you are cleared by your doctor.  Do not drive a car for 6 weeks or until released by  you surgeon.  Do not drive while taking narcotics.  WEIGHT BEARING Weight bearing as tolerated with assist device (walker, cane, etc) as directed, use it as long as suggested by your surgeon or therapist, typically at least 4-6 weeks.  POSTOPERATIVE CONSTIPATION PROTOCOL Constipation - defined medically as fewer than three stools per week and severe constipation as less than one stool per week.  One of the most common issues patients have following surgery is constipation.  Even if you have a regular bowel pattern at home, your normal regimen is likely to be disrupted due to multiple reasons following surgery.  Combination of anesthesia, postoperative narcotics, change in appetite and fluid intake all can affect your bowels.  In order to avoid complications following surgery, here are some recommendations in order to help you during your recovery period.  Colace (docusate) - Pick up an over-the-counter form of Colace or another stool softener and take twice a day as long as you are requiring postoperative pain medications.  Take with a full glass of water daily.  If you experience loose stools or diarrhea, hold the colace until you stool forms back up.  If your symptoms do not get better within 1 week or if they get worse, check with your doctor.  Dulcolax (bisacodyl) - Pick up over-the-counter and take as directed by the product packaging as needed to assist with the movement of your bowels.  Take with a full glass of water.  Use this product as needed if not relieved by Colace only.   MiraLax (polyethylene glycol) - Pick up over-the-counter to have on hand.  MiraLax is a solution that will increase the amount of water in your bowels to assist with bowel movements.  Take as directed and can mix with a glass of water, juice, soda, coffee, or tea.  Take if you go more than two days without a movement. Do not use MiraLax more than once per day. Call your doctor if you are still constipated or irregular  after using this medication for 7 days in a row.  If you continue to have problems with postoperative constipation, please contact the office for further assistance and recommendations.  If you experience "the worst abdominal pain ever" or develop nausea or vomiting, please contact the office immediatly for further recommendations for treatment.  ITCHING  If you experience itching with your medications, try taking only a single pain pill, or even half a pain pill at a time.  You can also use Benadryl over the counter for itching or also to help with sleep.   TED HOSE STOCKINGS Wear the elastic stockings on both legs for three weeks following surgery during the day but you may remove then at night for sleeping.  MEDICATIONS See your medication summary on the "After Visit Summary" that the nursing staff will review with you prior to discharge.  You may have some home medications which will be placed on hold until you complete the course of blood thinner medication.  It is important for you to complete the blood thinner medication as prescribed by your surgeon.  Continue your approved medications as instructed at time of discharge.  PRECAUTIONS If you experience chest pain or shortness of breath - call 911 immediately for transfer to the hospital emergency department.  If you develop a fever greater that 101 F, purulent drainage from wound, increased redness or drainage from wound, foul odor from the wound/dressing, or calf pain - CONTACT YOUR SURGEON.                                                   FOLLOW-UP APPOINTMENTS Make sure you keep all of your appointments after your operation with your surgeon and caregivers. You should call the office at the above phone number and make an appointment for approximately two weeks after the date of your surgery or on the date instructed by your surgeon outlined in the "After Visit Summary".  RANGE OF MOTION AND STRENGTHENING EXERCISES  These exercises are  designed to help you keep full movement of your hip joint. Follow your caregiver's or physical therapist's instructions. Perform all exercises about fifteen times, three times per day or as directed. Exercise both hips, even if you have had only one joint replacement. These exercises can be done on a training (exercise) mat, on the floor, on a table or on a bed. Use whatever works the best  and is most comfortable for you. Use music or television while you are exercising so that the exercises are a pleasant break in your day. This will make your life better with the exercises acting as a break in routine you can look forward to.  Lying on your back, slowly slide your foot toward your buttocks, raising your knee up off the floor. Then slowly slide your foot back down until your leg is straight again.  Lying on your back spread your legs as far apart as you can without causing discomfort.  Lying on your side, raise your upper leg and foot straight up from the floor as far as is comfortable. Slowly lower the leg and repeat.  Lying on your back, tighten up the muscle in the front of your thigh (quadriceps muscles). You can do this by keeping your leg straight and trying to raise your heel off the floor. This helps strengthen the largest muscle supporting your knee.  Lying on your back, tighten up the muscles of your buttocks both with the legs straight and with the knee bent at a comfortable angle while keeping your heel on the floor.   IF YOU ARE TRANSFERRED TO A SKILLED REHAB FACILITY If the patient is transferred to a skilled rehab facility following release from the hospital, a list of the current medications will be sent to the facility for the patient to continue.  When discharged from the skilled rehab facility, please have the facility set up the patient's [place] prior to being released. Also, the skilled facility will be responsible for providing the patient with their medications  at time of release from the facility to include their pain medication, the muscle relaxants, and their blood thinner medication. If the patient is still at the rehab facility at time of the two week follow up appointment, the skilled rehab facility will also need to assist the patient in arranging follow up appointment in our office and any transportation needs.  MAKE SURE YOU:  Understand these instructions.  Get help right away if you are not doing well or get worse.    Pick up stool softner and laxative for home use following surgery while on pain medications. Do not submerge incision under water. Please use good hand washing techniques while changing dressing each day. May shower starting three days after surgery. Please use a clean towel to pat the incision dry following showers. Continue to use ice for pain and swelling after surgery. Do not use any lotions or creams on the incision until instructed by your surgeon.   Do not sit on low chairs, stoools or toilet seats, as it may be difficult to get up from low surfaces   Complete by:  As directed    Driving restrictions   Complete by:  As directed    No driving for two weeks   TED hose   Complete by:  As directed    Use stockings (TED hose) for three weeks on both leg(s).  You may remove them at night for sleeping.   Weight bearing as tolerated   Complete by:  As directed      Allergies as of 03/26/2018   No Known Allergies     Medication List    STOP taking these medications   HYDROcodone-acetaminophen 10-325 MG tablet Commonly known as:  NORCO Replaced by:  HYDROcodone-acetaminophen 5-325 MG tablet     TAKE these medications   ALPRAZolam 0.25 MG tablet Commonly known as:  XANAX Take 0.25 mg by mouth 2 (two) times daily as needed for anxiety.   aspirin 325 MG EC tablet Take 1 tablet (325 mg total) by mouth 2 (two) times daily for 20 days. Then resume one 81 mg aspirin once a day. What changed:    medication  strength  how much to take  when to take this  additional instructions   CALCIUM 600+D3 PO Take 1 tablet by mouth 2 (two) times daily.   cholecalciferol 1000 units tablet Commonly known as:  VITAMIN D Take 1,000 Units by mouth every evening.   estradiol 0.5 MG tablet Commonly known as:  ESTRACE Take 1 tablet (0.5 mg total) by mouth daily. What changed:  when to take this   Fish Oil 1000 MG Caps Take 1,000 mg by mouth 2 (two) times daily.   HYDROcodone-acetaminophen 5-325 MG tablet Commonly known as:  NORCO/VICODIN Take 1-2 tablets by mouth every 6 (six) hours as needed for moderate pain (pain score 4-6). Replaces:  HYDROcodone-acetaminophen 10-325 MG tablet   loperamide 2 MG capsule Commonly known as:  IMODIUM Take by mouth as needed for diarrhea or loose stools.   loteprednol 0.5 % ophthalmic suspension Commonly known as:  LOTEMAX Place 1 drop into both eyes 2 (two) times daily as needed (for dry eyes).   methocarbamol 500 MG tablet Commonly known as:  ROBAXIN Take 1 tablet (500 mg total) by mouth every 6 (six) hours as needed for muscle spasms.   multivitamin with minerals Tabs tablet Take 1 tablet by mouth daily. Senior Multivitamin   pantoprazole 40 MG tablet Commonly known as:  PROTONIX Take 40 mg by mouth 2 (two) times daily.   potassium chloride SA 20 MEQ tablet Commonly known as:  K-DUR,KLOR-CON Take 20 mEq by mouth every Monday, Wednesday, and Friday.   Red Yeast Rice 600 MG Caps Take 600 mg by mouth 2 (two) times daily.   temazepam 15 MG capsule Commonly known as:  RESTORIL Take 15 mg by mouth at bedtime.   traMADol 50 MG tablet Commonly known as:  ULTRAM Take 1-2 tablets (50-100 mg total) by mouth every 6 (six) hours as needed for moderate pain (not responsive to hydrocodone).            Discharge Care Instructions  (From admission, onward)         Start     Ordered   03/26/18 0000  Weight bearing as tolerated     03/26/18 0737    03/26/18 0000  Change dressing    Comments:  You may change your dressing on Friday (09/13), then change the dressing daily with sterile 4 x 4 inch gauze dressing and paper tape.   03/26/18 0737         Follow-up Information    Gaynelle Arabian, MD. Schedule an appointment as soon as possible for a visit on 04/09/2018.   Specialty:  Orthopedic Surgery Contact information: [address] [place] [address] [phone]           Signed: Theresa Duty, PA-C Orthopedic Surgery 03/30/2018, 9:11 AM

## 2018-04-06 DIAGNOSIS — N951 Menopausal and female climacteric states: Secondary | ICD-10-CM | POA: Diagnosis not present

## 2018-04-06 DIAGNOSIS — K219 Gastro-esophageal reflux disease without esophagitis: Secondary | ICD-10-CM | POA: Diagnosis not present

## 2018-04-06 DIAGNOSIS — F419 Anxiety disorder, unspecified: Secondary | ICD-10-CM | POA: Diagnosis not present

## 2018-04-06 DIAGNOSIS — R197 Diarrhea, unspecified: Secondary | ICD-10-CM | POA: Diagnosis not present

## 2018-04-06 DIAGNOSIS — E7800 Pure hypercholesterolemia, unspecified: Secondary | ICD-10-CM | POA: Diagnosis not present

## 2018-04-06 DIAGNOSIS — R002 Palpitations: Secondary | ICD-10-CM | POA: Diagnosis not present

## 2018-04-06 DIAGNOSIS — Z1389 Encounter for screening for other disorder: Secondary | ICD-10-CM | POA: Diagnosis not present

## 2018-04-06 DIAGNOSIS — F5101 Primary insomnia: Secondary | ICD-10-CM | POA: Diagnosis not present

## 2018-04-28 DIAGNOSIS — M1612 Unilateral primary osteoarthritis, left hip: Secondary | ICD-10-CM | POA: Diagnosis not present

## 2018-04-30 DIAGNOSIS — T1511XA Foreign body in conjunctival sac, right eye, initial encounter: Secondary | ICD-10-CM | POA: Diagnosis not present

## 2018-04-30 DIAGNOSIS — H04123 Dry eye syndrome of bilateral lacrimal glands: Secondary | ICD-10-CM | POA: Diagnosis not present

## 2018-04-30 DIAGNOSIS — Z23 Encounter for immunization: Secondary | ICD-10-CM | POA: Diagnosis not present

## 2018-07-13 ENCOUNTER — Ambulatory Visit: Payer: PPO | Admitting: Physician Assistant

## 2018-07-22 DIAGNOSIS — Z1231 Encounter for screening mammogram for malignant neoplasm of breast: Secondary | ICD-10-CM | POA: Diagnosis not present

## 2018-08-12 DIAGNOSIS — H5203 Hypermetropia, bilateral: Secondary | ICD-10-CM | POA: Diagnosis not present

## 2018-09-18 DIAGNOSIS — S63522A Sprain of radiocarpal joint of left wrist, initial encounter: Secondary | ICD-10-CM | POA: Diagnosis not present

## 2018-09-18 DIAGNOSIS — M25532 Pain in left wrist: Secondary | ICD-10-CM | POA: Diagnosis not present

## 2019-01-01 DIAGNOSIS — N951 Menopausal and female climacteric states: Secondary | ICD-10-CM | POA: Diagnosis not present

## 2019-01-01 DIAGNOSIS — F5101 Primary insomnia: Secondary | ICD-10-CM | POA: Diagnosis not present

## 2019-01-01 DIAGNOSIS — R002 Palpitations: Secondary | ICD-10-CM | POA: Diagnosis not present

## 2019-01-01 DIAGNOSIS — K219 Gastro-esophageal reflux disease without esophagitis: Secondary | ICD-10-CM | POA: Diagnosis not present

## 2019-01-01 DIAGNOSIS — E7800 Pure hypercholesterolemia, unspecified: Secondary | ICD-10-CM | POA: Diagnosis not present

## 2019-01-01 DIAGNOSIS — F419 Anxiety disorder, unspecified: Secondary | ICD-10-CM | POA: Diagnosis not present

## 2019-01-01 DIAGNOSIS — M1990 Unspecified osteoarthritis, unspecified site: Secondary | ICD-10-CM | POA: Diagnosis not present

## 2019-01-07 DIAGNOSIS — M1611 Unilateral primary osteoarthritis, right hip: Secondary | ICD-10-CM | POA: Diagnosis not present

## 2019-02-12 ENCOUNTER — Encounter: Payer: Self-pay | Admitting: Gastroenterology

## 2019-02-25 DIAGNOSIS — E7800 Pure hypercholesterolemia, unspecified: Secondary | ICD-10-CM | POA: Diagnosis not present

## 2019-02-25 DIAGNOSIS — I10 Essential (primary) hypertension: Secondary | ICD-10-CM | POA: Diagnosis not present

## 2019-02-25 DIAGNOSIS — M1612 Unilateral primary osteoarthritis, left hip: Secondary | ICD-10-CM | POA: Diagnosis not present

## 2019-03-02 ENCOUNTER — Ambulatory Visit: Payer: PPO | Admitting: Gastroenterology

## 2019-03-02 ENCOUNTER — Encounter: Payer: Self-pay | Admitting: Gastroenterology

## 2019-03-26 ENCOUNTER — Encounter: Payer: Self-pay | Admitting: Gastroenterology

## 2019-03-26 ENCOUNTER — Other Ambulatory Visit: Payer: Self-pay

## 2019-03-26 ENCOUNTER — Ambulatory Visit (AMBULATORY_SURGERY_CENTER): Payer: Self-pay

## 2019-03-26 VITALS — Ht 64.0 in | Wt 139.0 lb

## 2019-03-26 DIAGNOSIS — Z800 Family history of malignant neoplasm of digestive organs: Secondary | ICD-10-CM

## 2019-03-26 MED ORDER — PEG 3350-KCL-NA BICARB-NACL 420 G PO SOLR: 4000.0000 mL | Freq: Once | ORAL | 0 refills | Status: AC

## 2019-03-26 NOTE — Progress Notes (Signed)
Denies allergies to eggs or soy products. Denies complication of anesthesia or sedation. Denies use of weight loss medication. Denies use of O2.   Emmi instructions given for colonoscopy.  

## 2019-04-08 ENCOUNTER — Telehealth: Payer: Self-pay

## 2019-04-08 NOTE — Telephone Encounter (Signed)
Covid-19 screening questions   Do you now or have you had a fever in the last 14 days? NO   Do you have any respiratory symptoms of shortness of breath or cough now or in the last 14 days? NO  Do you have any family members or close contacts with diagnosed or suspected Covid-19 in the past 14 days? NO  Have you been tested for Covid-19 and found to be positive? NO        

## 2019-04-09 ENCOUNTER — Ambulatory Visit (AMBULATORY_SURGERY_CENTER): Payer: PPO | Admitting: Gastroenterology

## 2019-04-09 ENCOUNTER — Encounter: Payer: Self-pay | Admitting: Gastroenterology

## 2019-04-09 ENCOUNTER — Other Ambulatory Visit: Payer: Self-pay

## 2019-04-09 VITALS — BP 131/75 | HR 64 | Temp 97.8°F | Resp 13 | Ht 64.0 in | Wt 139.0 lb

## 2019-04-09 DIAGNOSIS — K52832 Lymphocytic colitis: Secondary | ICD-10-CM

## 2019-04-09 DIAGNOSIS — Z800 Family history of malignant neoplasm of digestive organs: Secondary | ICD-10-CM

## 2019-04-09 DIAGNOSIS — Z1211 Encounter for screening for malignant neoplasm of colon: Secondary | ICD-10-CM | POA: Diagnosis not present

## 2019-04-09 DIAGNOSIS — R197 Diarrhea, unspecified: Secondary | ICD-10-CM

## 2019-04-09 MED ORDER — SODIUM CHLORIDE 0.9 % IV SOLN: 500.0000 mL | Freq: Once | INTRAVENOUS | Status: DC

## 2019-04-09 NOTE — Progress Notes (Signed)
PT taken to PACU. Monitors in place. VSS. Report given to RN. 

## 2019-04-09 NOTE — Patient Instructions (Signed)
YOU HAD AN ENDOSCOPIC PROCEDURE TODAY AT THE [facility]:   Refer to the procedure report that was given to you for any specific questions about what was found during the examination.  If the procedure report does not answer your questions, please call your gastroenterologist to clarify.  If you requested that your care partner not be given the details of your procedure findings, then the procedure report has been included in a sealed envelope for you to review at your convenience later.  YOU SHOULD EXPECT: Some feelings of bloating in the abdomen. Passage of more gas than usual.  Walking can help get rid of the air that was put into your GI tract during the procedure and reduce the bloating. If you had a lower endoscopy (such as a colonoscopy or flexible sigmoidoscopy) you may notice spotting of blood in your stool or on the toilet paper. If you underwent a bowel prep for your procedure, you may not have a normal bowel movement for a few days.  Please Note:  You might notice some irritation and congestion in your nose or some drainage.  This is from the oxygen used during your procedure.  There is no need for concern and it should clear up in a day or so.  SYMPTOMS TO REPORT IMMEDIATELY:   Following lower endoscopy (colonoscopy or flexible sigmoidoscopy):  Excessive amounts of blood in the stool  Significant tenderness or worsening of abdominal pains  Swelling of the abdomen that is new, acute  Fever of 100F or higher   For urgent or emergent issues, a gastroenterologist can be reached at any hour by calling [phone].   DIET:  We do recommend a small meal at first, but then you may proceed to your regular diet.  Drink plenty of fluids but you should avoid alcoholic beverages for 24 hours.  MEDICATIONS: Continue present medications.  Please see handouts given to you by your recovery nurse.  ACTIVITY:  You should plan to take it easy for the rest of today and you should  NOT DRIVE or use heavy machinery until tomorrow (because of the sedation medicines used during the test).    FOLLOW UP: Our staff will call the number listed on your records 48-72 hours following your procedure to check on you and address any questions or concerns that you may have regarding the information given to you following your procedure. If we do not reach you, we will leave a message.  We will attempt to reach you two times.  During this call, we will ask if you have developed any symptoms of COVID 19. If you develop any symptoms (ie: fever, flu-like symptoms, shortness of breath, cough etc.) before then, please call [phone].  If you test positive for Covid 19 in the 2 weeks post procedure, please call and report this information to us.    If any biopsies were taken you will be contacted by phone or by letter within the next 1-3 weeks.  Please call us at [phone] if you have not heard about the biopsies in 3 weeks.   Thank you for allowing us to provide for your healthcare needs today.   SIGNATURES/CONFIDENTIALITY: You and/or your care partner have signed paperwork which will be entered into your electronic medical record.  These signatures attest to the fact that that the information above on your After Visit Summary has been reviewed and is understood.  Full responsibility of the confidentiality of this discharge information lies with you and/or   your care-partner. 

## 2019-04-09 NOTE — Progress Notes (Signed)
Temperature- Benton RN  Pt's states no medical or surgical changes since previsit or office visit.

## 2019-04-09 NOTE — Op Note (Signed)
[facility] Patient Name: [name] Procedure Date: 04/09/2019 9:31 AM MRN: [id number] Endoscopist: Milus Banister , MD Age: 73 Referring MD:  Date of Birth: 17-Sep-1945 Gender: Female Account #: [id number] Procedure:                Colonoscopy Indications:              Screening for colon cancer: Family history of                            colorectal cancer in distant relative(s) before age                            6 ; father had colon cancer in his 3s; also                            change in bowels, diarrhea for months. Medicines:                Monitored Anesthesia Care Procedure:                Pre-Anesthesia Assessment:                           - Prior to the procedure, a History and Physical                            was performed, and patient medications and                            allergies were reviewed. The patient's tolerance of                            previous anesthesia was also reviewed. The risks                            and benefits of the procedure and the sedation                            options and risks were discussed with the patient.                            All questions were answered, and informed consent                            was obtained. Prior Anticoagulants: The patient has                            taken no previous anticoagulant or antiplatelet                            agents. ASA Grade Assessment: II - A patient with                            mild systemic disease. After reviewing the risks  and benefits, the patient was deemed in                            satisfactory condition to undergo the procedure.                           After obtaining informed consent, the colonoscope                            was passed under direct vision. Throughout the                            procedure, the patient's blood pressure, pulse, and                            oxygen saturations were  monitored continuously. The                            Colonoscope was introduced through the anus and                            advanced to the the terminal ileum. The colonoscopy                            was performed without difficulty. The patient                            tolerated the procedure well. The quality of the                            bowel preparation was good. The terminal ileum,                            ileocecal valve, appendiceal orifice, and rectum                            were photographed. Scope In: 9:36:42 AM Scope Out: 9:51:09 AM Scope Withdrawal Time: 0 hours 6 minutes 17 seconds  Total Procedure Duration: 0 hours 14 minutes 27 seconds  Findings:                 The terminal ileum appeared normal.                           The entire examined colon appeared normal.                           Biopsies for histology were taken with a cold                            forceps from the entire colon for evaluation of                            microscopic colitis.  The retroflexed view of the distal rectum and anal                            verge was normal and showed no anal or rectal                            abnormalities. Complications:            No immediate complications. Estimated blood loss:                            None. Estimated Blood Loss:     Estimated blood loss: none. Impression:               - The examined portion of the ileum was normal.                           - The entire examined colon is normal.                           - The distal rectum and anal verge are normal on                            retroflexion view.                           - Biopsies were taken with a cold forceps from the                            entire colon for evaluation of microscopic colitis. Recommendation:           - Patient has a contact number available for                            emergencies. The signs and symptoms of  potential                            delayed complications were discussed with the                            patient. Return to normal activities tomorrow.                            Written discharge instructions were provided to the                            patient.                           - Resume previous diet.                           - Continue present medications.                           - Await pathology results.                           -  Repeat colonoscopy in 5 years for screening                            purposes. Milus Banister, MD 04/09/2019 9:54:46 AM This report has been signed electronically.

## 2019-04-09 NOTE — Progress Notes (Signed)
Called to room to assist during endoscopic procedure.  Patient ID and intended procedure confirmed with present staff. Received instructions for my participation in the procedure from the performing physician.  

## 2019-04-13 ENCOUNTER — Other Ambulatory Visit: Payer: Self-pay

## 2019-04-13 ENCOUNTER — Telehealth: Payer: Self-pay | Admitting: *Deleted

## 2019-04-13 MED ORDER — BUDESONIDE 3 MG PO CPEP: 9.0000 mg | ORAL_CAPSULE | Freq: Every day | ORAL | 3 refills | Status: AC

## 2019-04-13 NOTE — Telephone Encounter (Signed)
1. Have you developed a fever since your procedure? no  2.   Have you had an respiratory symptoms (SOB or cough) since your procedure? no  3.   Have you tested positive for COVID 19 since your procedure no  4.   Have you had any family members/close contacts diagnosed with the COVID 19 since your procedure?  no   If yes to any of these questions please route to Joylene John, RN and Alphonsa Gin, Therapist, sports.  Follow up Call-  Call back number 04/09/2019  Post procedure Call Back phone  # [phone]  Permission to leave phone message Yes  Some recent data might be hidden     Patient questions:  Do you have a fever, pain , or abdominal swelling? No. Pain Score  0 *  Have you tolerated food without any problems? Yes.    Have you been able to return to your normal activities? Yes.    Do you have any questions about your discharge instructions: Diet   No. Medications  No. Follow up visit  No.  Do you have questions or concerns about your Care? No.  Actions: * If pain score is 4 or above: No action needed, pain <4.

## 2019-04-14 DIAGNOSIS — E7800 Pure hypercholesterolemia, unspecified: Secondary | ICD-10-CM | POA: Diagnosis not present

## 2019-04-14 DIAGNOSIS — M1612 Unilateral primary osteoarthritis, left hip: Secondary | ICD-10-CM | POA: Diagnosis not present

## 2019-04-14 DIAGNOSIS — M1990 Unspecified osteoarthritis, unspecified site: Secondary | ICD-10-CM | POA: Diagnosis not present

## 2019-04-21 ENCOUNTER — Encounter: Payer: Self-pay | Admitting: Gynecology

## 2019-05-14 DIAGNOSIS — M1612 Unilateral primary osteoarthritis, left hip: Secondary | ICD-10-CM | POA: Diagnosis not present

## 2019-05-14 DIAGNOSIS — E7800 Pure hypercholesterolemia, unspecified: Secondary | ICD-10-CM | POA: Diagnosis not present

## 2019-05-27 ENCOUNTER — Ambulatory Visit: Payer: PPO | Admitting: Gastroenterology

## 2019-06-01 ENCOUNTER — Ambulatory Visit (INDEPENDENT_AMBULATORY_CARE_PROVIDER_SITE_OTHER): Payer: PPO | Admitting: Gastroenterology

## 2019-06-01 ENCOUNTER — Encounter: Payer: Self-pay | Admitting: Gastroenterology

## 2019-06-01 VITALS — BP 130/76 | HR 74 | Temp 97.8°F | Ht 64.0 in | Wt 137.8 lb

## 2019-06-01 DIAGNOSIS — K52832 Lymphocytic colitis: Secondary | ICD-10-CM

## 2019-06-01 NOTE — Progress Notes (Signed)
Review of pertinent gastrointestinal problems: 1.  Family history of colon cancer, father diagnosed in his 52s.  Colonoscopy September 2020 showed no sign of polyps or cancers.  Terminal ileum was normal recommended repeat colonoscopy at 5-year interval.  during this test I took random biopsies from her colon since she told me she had been having significant diarrhea for several months leading into it.  Biopsies confirmed lymphocytic colitis 2.  Lymphocytic colitis confirmed by biopsy during colonoscopy September 2020.  Terminal ileum was normal.  She was started on budesonide 9 mg daily.   HPI: This is a very pleasant 73 year old woman whom I am meeting for the first time in the office today.    I last saw her at the time of a screening colonoscopy, see that result above.  Just prior to the procedure she mentioned that she had been having loose stools for many months.  I decided without information that I would perform random colon biopsies which confirmed lymphocytic colitis  She started taking budesonide 9 mg once daily and very soon thereafter noticed a significant improvement in her bowel habits.  Prior to the medicine she was having for 5 loose unformed stools daily for about a year.  Never bleeding.  After she started budesonide she was having 1 or 2 soft formed stools daily.  Not as hard as they used to be for her, she previously used to need a stool softener actually.  She is quite happy with the improvement however $90 per month for her budesonide is too much for her to afford.   Review of systems: Pertinent positive and negative review of systems were noted in the above HPI section. All other review negative.   Past Medical History:  Diagnosis Date  . Allergy   . Arthritis    oa  . Cataract of both eyes   . Cervical dysplasia   . Diarrhea    off and on last 3 months 1 episode today  . Dry eyes   . Fluid retention    slight leg swelling at times  . GERD (gastroesophageal  reflux disease)   . Legally blind in left eye, as defined in [place]    since birth   . Low back pain   . Lumbar herniated disc   . Numbness and tingling    feet bilat   . Osteopenia 06/2015    T score -1.7  . Syncope    none recent  . Urinary frequency   . Urinary hesitancy   . Wears glasses     Past Surgical History:  Procedure Laterality Date  . COLONOSCOPY    . LUMBAR LAMINECTOMY     2009 secondary to ruptured disc;   . TOTAL HIP ARTHROPLASTY Left 03/25/2018   Procedure: LEFT TOTAL HIP ARTHROPLASTY ANTERIOR APPROACH;  Surgeon: Gaynelle Arabian, MD;  Location: [facility];  Service: Orthopedics;  Laterality: Left;  . TOTAL KNEE ARTHROPLASTY Left 08/02/2015   Procedure: LEFT TOTAL KNEE ARTHROPLASTY;  Surgeon: Gaynelle Arabian, MD;  Location: [facility];  Service: Orthopedics;  Laterality: Left;  . UPPER GASTROINTESTINAL ENDOSCOPY    . VAGINAL HYSTERECTOMY  1980 or 1981   partial    Current Outpatient Medications  Medication Sig Dispense Refill  . ALPRAZolam (XANAX) 0.25 MG tablet Take 0.25 mg by mouth 2 (two) times daily as needed for anxiety.     Marland Kitchen aspirin EC 81 MG tablet Take 81 mg by mouth daily.    . budesonide (ENTOCORT EC) 3 MG 24  hr capsule Take 9 mg by mouth daily.    . Calcium Carb-Cholecalciferol (CALCIUM 600+D3 PO) Take 1 tablet by mouth 2 (two) times daily.    . cholecalciferol (VITAMIN D) 1000 units tablet Take 1,000 Units by mouth every evening.    Marland Kitchen estradiol (ESTRACE) 0.5 MG tablet Take 1 tablet (0.5 mg total) by mouth daily. (Patient taking differently: Take 0.5 mg by mouth every evening. ) 90 tablet 4  . Multiple Vitamin (MULTIVITAMIN WITH MINERALS) TABS tablet Take 1 tablet by mouth daily. Senior Multivitamin    . Omega-3 Fatty Acids (FISH OIL) 1000 MG CAPS Take 1,000 mg by mouth 2 (two) times daily.    . pantoprazole (PROTONIX) 40 MG tablet Take 40 mg by mouth as needed.     . potassium chloride SA (K-DUR,KLOR-CON) 20 MEQ tablet Take 20 mEq by mouth every Monday, Wednesday,  and Friday.     . propranolol (INDERAL) 20 MG tablet Take 20 mg by mouth as needed.    . Red Yeast Rice 600 MG CAPS Take 600 mg by mouth 2 (two) times daily.    . temazepam (RESTORIL) 15 MG capsule Take 15 mg by mouth at bedtime.      Current Facility-Administered Medications  Medication Dose Route Frequency Provider Last Rate Last Dose  . 0.9 %  sodium chloride infusion  500 mL Intravenous Once Milus Banister, MD        Allergies as of 06/01/2019  . (No Known Allergies)    Family History  Problem Relation Age of Onset  . Heart disease Father   . Cancer Father        colon  . Diabetes Father        borderline  . Colon cancer Father   . Diabetes Brother   . Hypertension Brother   . Esophageal cancer Neg Hx   . Rectal cancer Neg Hx   . Stomach cancer Neg Hx     Social History   Socioeconomic History  . Marital status: Married    Spouse name: Not on file  . Number of children: Not on file  . Years of education: Not on file  . Highest education level: Not on file  Occupational History  . Not on file  Social Needs  . Financial resource strain: Not on file  . Food insecurity    Worry: Not on file    Inability: Not on file  . Transportation needs    Medical: Not on file    Non-medical: Not on file  Tobacco Use  . Smoking status: Never Smoker  . Smokeless tobacco: Never Used  Substance and Sexual Activity  . Alcohol use: No    Alcohol/week: 0.0 standard drinks  . Drug use: No  . Sexual activity: Yes    Birth control/protection: Surgical    Comment: Hyst-1st intercourse 21 yo-1 partner  Lifestyle  . Physical activity    Days per week: Not on file    Minutes per session: Not on file  . Stress: Not on file  Relationships  . Social Herbalist on phone: Not on file    Gets together: Not on file    Attends religious service: Not on file    Active member of club or organization: Not on file    Attends meetings of clubs or organizations: Not on file     Relationship status: Not on file  . Intimate partner violence    Fear of current or ex partner:  Not on file    Emotionally abused: Not on file    Physically abused: Not on file    Forced sexual activity: Not on file  Other Topics Concern  . Not on file  Social History Narrative  . Not on file     Physical Exam: BP 130/76   Pulse 74   Temp 97.8 F (36.6 C)   Ht 5\' 4"  (1.626 m)   Wt 137 lb 12.8 oz (62.5 kg)   LMP 07/16/1979   BMI 23.65 kg/m  Constitutional: generally well-appearing Psychiatric: alert and oriented x3 Eyes: extraocular movements intact Mouth: oral pharynx moist, no lesions Neck: supple no lymphadenopathy Cardiovascular: heart regular rate and rhythm Lungs: clear to auscultation bilaterally Abdomen: soft, nontender, nondistended, no obvious ascites, no peritoneal signs, normal bowel sounds Extremities: no lower extremity edema bilaterally Skin: no lesions on visible extremities   Assessment and plan: 73 y.o. female with lymphocytic colitis  She has responded very well to budesonide.  She is going to decrease to 6 mg daily starting today and she will continue that for 1 month.  Then she will start taking 3 mg/day until she returns to see me in the office in about 10 weeks from now.  Further tapering depends on how she does with the above tapering regimen.   Please see the "Patient Instructions" section for addition details about the plan.   Owens Loffler, MD [facility] 06/01/2019, 2:16 PM  Cc: Shirline Frees, MD

## 2019-06-01 NOTE — Patient Instructions (Signed)
Take budesonide 2 tablets by mouth x 1 month then reduce to 1 tablet by mouth daily until your follow up appointment with Dr. Ardis Hughs in 10 weeks.

## 2019-06-04 DIAGNOSIS — H9390 Unspecified disorder of ear, unspecified ear: Secondary | ICD-10-CM | POA: Diagnosis not present

## 2019-07-26 DIAGNOSIS — Z1231 Encounter for screening mammogram for malignant neoplasm of breast: Secondary | ICD-10-CM | POA: Diagnosis not present

## 2019-08-04 DIAGNOSIS — M1612 Unilateral primary osteoarthritis, left hip: Secondary | ICD-10-CM | POA: Diagnosis not present

## 2019-08-04 DIAGNOSIS — E7800 Pure hypercholesterolemia, unspecified: Secondary | ICD-10-CM | POA: Diagnosis not present

## 2019-08-04 DIAGNOSIS — M1990 Unspecified osteoarthritis, unspecified site: Secondary | ICD-10-CM | POA: Diagnosis not present

## 2019-08-10 ENCOUNTER — Ambulatory Visit: Payer: PPO | Admitting: Gastroenterology

## 2019-08-10 ENCOUNTER — Other Ambulatory Visit: Payer: Self-pay

## 2019-08-10 ENCOUNTER — Encounter: Payer: Self-pay | Admitting: Gastroenterology

## 2019-08-10 VITALS — BP 121/64 | HR 75 | Temp 98.7°F | Ht 64.0 in | Wt 137.8 lb

## 2019-08-10 DIAGNOSIS — K52832 Lymphocytic colitis: Secondary | ICD-10-CM

## 2019-08-10 NOTE — Progress Notes (Signed)
Review of pertinent gastrointestinal problems: 1.  Family history of colon cancer, father diagnosed in his 33s.  Colonoscopy September 2020 showed no sign of polyps or cancers.  Terminal ileum was normal recommended repeat colonoscopy at 5-year interval.  during this test I took random biopsies from her colon since she told me she had been having significant diarrhea for several months leading into it.  Biopsies confirmed lymphocytic colitis 2.  Lymphocytic colitis (5 loose unformed stools daily for about 1 year) confirmed by biopsy during colonoscopy September 2020.  Terminal ileum was normal.  She was started on budesonide 9 mg daily with a very good response quickly.    HPI: This is a very pleasant 74 year old woman whom I last saw about 2 months ago.  Since then she has successfully tapered her budesonide.  She has been on 1 pill once daily for the past month or so.  On this regimen her bowels are much better than they were previously.  She is having 1 bowel movement a day, rarely a second bowel movement.  No loose or watery stools anymore.  She is very happy with the results.  No overt bleeding  ROS: complete GI ROS as described in HPI, all other review negative.  Constitutional:  No unintentional weight loss   Past Medical History:  Diagnosis Date  . Allergy   . Arthritis    oa  . Cataract of both eyes   . Cervical dysplasia   . Diarrhea    off and on last 3 months 1 episode today  . Dry eyes   . Fluid retention    slight leg swelling at times  . GERD (gastroesophageal reflux disease)   . Legally blind in left eye, as defined in [place]    since birth   . Low back pain   . Lumbar herniated disc   . Numbness and tingling    feet bilat   . Osteopenia 06/2015    T score -1.7  . Syncope    none recent  . Urinary frequency   . Urinary hesitancy   . Wears glasses     Past Surgical History:  Procedure Laterality Date  . COLONOSCOPY    . LUMBAR LAMINECTOMY     2009 secondary to  ruptured disc;   . TOTAL HIP ARTHROPLASTY Left 03/25/2018   Procedure: LEFT TOTAL HIP ARTHROPLASTY ANTERIOR APPROACH;  Surgeon: Gaynelle Arabian, MD;  Location: [facility];  Service: Orthopedics;  Laterality: Left;  . TOTAL KNEE ARTHROPLASTY Left 08/02/2015   Procedure: LEFT TOTAL KNEE ARTHROPLASTY;  Surgeon: Gaynelle Arabian, MD;  Location: [facility];  Service: Orthopedics;  Laterality: Left;  . UPPER GASTROINTESTINAL ENDOSCOPY    . VAGINAL HYSTERECTOMY  1980 or 1981   partial    Current Outpatient Medications  Medication Sig Dispense Refill  . ALPRAZolam (XANAX) 0.25 MG tablet Take 0.25 mg by mouth 2 (two) times daily as needed for anxiety.     Marland Kitchen aspirin EC 81 MG tablet Take 81 mg by mouth daily.    . budesonide (ENTOCORT EC) 3 MG 24 hr capsule Take 9 mg by mouth daily.    . Calcium Carb-Cholecalciferol (CALCIUM 600+D3 PO) Take 1 tablet by mouth 2 (two) times daily.    . cholecalciferol (VITAMIN D) 1000 units tablet Take 1,000 Units by mouth every evening.    Marland Kitchen estradiol (ESTRACE) 0.5 MG tablet Take 1 tablet (0.5 mg total) by mouth daily. (Patient taking differently: Take 0.5 mg by mouth every evening. )  90 tablet 4  . Multiple Vitamin (MULTIVITAMIN WITH MINERALS) TABS tablet Take 1 tablet by mouth daily. Senior Multivitamin    . Omega-3 Fatty Acids (FISH OIL) 1000 MG CAPS Take 1,000 mg by mouth 2 (two) times daily.    . pantoprazole (PROTONIX) 40 MG tablet Take 40 mg by mouth as needed.     . potassium chloride SA (K-DUR,KLOR-CON) 20 MEQ tablet Take 20 mEq by mouth every Monday, Wednesday, and Friday.     . propranolol (INDERAL) 20 MG tablet Take 20 mg by mouth as needed.    . Red Yeast Rice 600 MG CAPS Take 600 mg by mouth 2 (two) times daily.    . temazepam (RESTORIL) 15 MG capsule Take 15 mg by mouth at bedtime.      Current Facility-Administered Medications  Medication Dose Route Frequency Provider Last Rate Last Admin  . 0.9 %  sodium chloride infusion  500 mL Intravenous Once Milus Banister,  MD        Allergies as of 08/10/2019  . (No Known Allergies)    Family History  Problem Relation Age of Onset  . Heart disease Father   . Cancer Father        colon  . Diabetes Father        borderline  . Colon cancer Father   . Diabetes Brother   . Hypertension Brother   . Esophageal cancer Neg Hx   . Rectal cancer Neg Hx   . Stomach cancer Neg Hx     Social History   Socioeconomic History  . Marital status: Married    Spouse name: Not on file  . Number of children: Not on file  . Years of education: Not on file  . Highest education level: Not on file  Occupational History  . Not on file  Tobacco Use  . Smoking status: Never Smoker  . Smokeless tobacco: Never Used  Substance and Sexual Activity  . Alcohol use: No    Alcohol/week: 0.0 standard drinks  . Drug use: No  . Sexual activity: Yes    Birth control/protection: Surgical    Comment: Hyst-1st intercourse 89 yo-1 partner  Other Topics Concern  . Not on file  Social History Narrative  . Not on file   Social Determinants of Health   Financial Resource Strain:   . Difficulty of Paying Living Expenses: Not on file  Food Insecurity:   . Worried About Charity fundraiser in the Last Year: Not on file  . Ran Out of Food in the Last Year: Not on file  Transportation Needs:   . Lack of Transportation (Medical): Not on file  . Lack of Transportation (Non-Medical): Not on file  Physical Activity:   . Days of Exercise per Week: Not on file  . Minutes of Exercise per Session: Not on file  Stress:   . Feeling of Stress : Not on file  Social Connections:   . Frequency of Communication with Friends and Family: Not on file  . Frequency of Social Gatherings with Friends and Family: Not on file  . Attends Religious Services: Not on file  . Active Member of Clubs or Organizations: Not on file  . Attends Archivist Meetings: Not on file  . Marital Status: Not on file  Intimate Partner Violence:   . Fear  of Current or Ex-Partner: Not on file  . Emotionally Abused: Not on file  . Physically Abused: Not on file  . Sexually  Abused: Not on file     Physical Exam: BP 121/64   Pulse 75   Temp 98.7 F (37.1 C)   Ht 5\' 4"  (1.626 m)   Wt 137 lb 12.8 oz (62.5 kg)   LMP 07/16/1979   SpO2 98%   BMI 23.65 kg/m  Constitutional: generally well-appearing Psychiatric: alert and oriented x3 Abdomen: soft, nontender, nondistended, no obvious ascites, no peritoneal signs, normal bowel sounds No peripheral edema noted in lower extremities  Assessment and plan: 74 y.o. female with lymphocytic colitis  Significant improvement on oral budesonide.  She has been on 3 mg once daily for the past month.  She will taper to every other day for the next week until she is off.  She knows to call if the diarrhea returns at any time.  She asked me to review her husband's colon polyp follow-up as well and I am happy to do that.  Please see the "Patient Instructions" section for addition details about the plan.  Owens Loffler, MD [facility] 08/10/2019, 2:26 PM   Total time on date of encounter was 20 minutes (this included time spent preparing to see the patient reviewing records; obtaining and/or reviewing separately obtained history; performing a medically appropriate exam and/or evaluation; counseling and educating the patient and family if present; ordering medications, tests or procedures if applicable; and documenting clinical information in the health record).

## 2019-08-10 NOTE — Patient Instructions (Signed)
If you are age 75 or older, your body mass index should be between 23-30. Your Body mass index is 23.65 kg/m. If this is out of the aforementioned range listed, please consider follow up with your Primary Care Provider.  If you are age 29 or younger, your body mass index should be between 19-25. Your Body mass index is 23.65 kg/m. If this is out of the aformentioned range listed, please consider follow up with your Primary Care Provider.   REDUCE: budesonide to 1 tablet every other day for 7 days then stop.  You will follow up with Dr Ardis Hughs as needed or if diarrhea returns.  Thank you, Elmyra Ricks

## 2019-08-19 ENCOUNTER — Telehealth: Payer: Self-pay | Admitting: Gastroenterology

## 2019-08-19 NOTE — Telephone Encounter (Signed)
I want to see if scheduled daily imodium helps as well as the budesonide.  Recommend 2 imodium pills every morning shortly after waking, she needs to call in 2-3 weeks to report on her response, sooner if it's clearly not working

## 2019-08-19 NOTE — Telephone Encounter (Signed)
Patient called states she is not feeling well and has question\concerns with medications.

## 2019-08-19 NOTE — Telephone Encounter (Signed)
The pt states she was seen on 1/26 and was told to taper off of budesonide.  She did as directed but states that her diarrhea has returned.  She did take 9 mg budesonide this morning and feels somewhat better.  Can she go back on budesonide?

## 2019-08-19 NOTE — Telephone Encounter (Signed)
Left message on machine to call back  

## 2019-08-20 NOTE — Telephone Encounter (Signed)
Patient returned your call, please call patient one more time.   

## 2019-08-20 NOTE — Telephone Encounter (Signed)
The patient has been notified of this information and all questions answered. The pt will call in 2-3 weeks to give an update sooner if needed.

## 2019-08-26 DIAGNOSIS — M85851 Other specified disorders of bone density and structure, right thigh: Secondary | ICD-10-CM | POA: Diagnosis not present

## 2019-09-01 DIAGNOSIS — M1611 Unilateral primary osteoarthritis, right hip: Secondary | ICD-10-CM | POA: Diagnosis not present

## 2019-11-03 DIAGNOSIS — M1990 Unspecified osteoarthritis, unspecified site: Secondary | ICD-10-CM | POA: Diagnosis not present

## 2019-11-03 DIAGNOSIS — M1612 Unilateral primary osteoarthritis, left hip: Secondary | ICD-10-CM | POA: Diagnosis not present

## 2019-11-03 DIAGNOSIS — E7800 Pure hypercholesterolemia, unspecified: Secondary | ICD-10-CM | POA: Diagnosis not present

## 2019-12-06 DIAGNOSIS — E7800 Pure hypercholesterolemia, unspecified: Secondary | ICD-10-CM | POA: Diagnosis not present

## 2019-12-06 DIAGNOSIS — M1612 Unilateral primary osteoarthritis, left hip: Secondary | ICD-10-CM | POA: Diagnosis not present

## 2019-12-06 DIAGNOSIS — M1990 Unspecified osteoarthritis, unspecified site: Secondary | ICD-10-CM | POA: Diagnosis not present

## 2019-12-23 DIAGNOSIS — R002 Palpitations: Secondary | ICD-10-CM | POA: Diagnosis not present

## 2019-12-23 DIAGNOSIS — F5101 Primary insomnia: Secondary | ICD-10-CM | POA: Diagnosis not present

## 2019-12-23 DIAGNOSIS — K219 Gastro-esophageal reflux disease without esophagitis: Secondary | ICD-10-CM | POA: Diagnosis not present

## 2019-12-23 DIAGNOSIS — E7800 Pure hypercholesterolemia, unspecified: Secondary | ICD-10-CM | POA: Diagnosis not present

## 2019-12-23 DIAGNOSIS — F419 Anxiety disorder, unspecified: Secondary | ICD-10-CM | POA: Diagnosis not present

## 2019-12-30 DIAGNOSIS — M1612 Unilateral primary osteoarthritis, left hip: Secondary | ICD-10-CM | POA: Diagnosis not present

## 2019-12-30 DIAGNOSIS — E7800 Pure hypercholesterolemia, unspecified: Secondary | ICD-10-CM | POA: Diagnosis not present

## 2019-12-30 DIAGNOSIS — M1990 Unspecified osteoarthritis, unspecified site: Secondary | ICD-10-CM | POA: Diagnosis not present

## 2020-02-01 DIAGNOSIS — E7800 Pure hypercholesterolemia, unspecified: Secondary | ICD-10-CM | POA: Diagnosis not present

## 2020-02-01 DIAGNOSIS — M1990 Unspecified osteoarthritis, unspecified site: Secondary | ICD-10-CM | POA: Diagnosis not present

## 2020-02-01 DIAGNOSIS — M1612 Unilateral primary osteoarthritis, left hip: Secondary | ICD-10-CM | POA: Diagnosis not present

## 2020-03-09 DIAGNOSIS — M1612 Unilateral primary osteoarthritis, left hip: Secondary | ICD-10-CM | POA: Diagnosis not present

## 2020-03-09 DIAGNOSIS — M1990 Unspecified osteoarthritis, unspecified site: Secondary | ICD-10-CM | POA: Diagnosis not present

## 2020-03-09 DIAGNOSIS — E7800 Pure hypercholesterolemia, unspecified: Secondary | ICD-10-CM | POA: Diagnosis not present

## 2020-03-21 DIAGNOSIS — Z1159 Encounter for screening for other viral diseases: Secondary | ICD-10-CM | POA: Diagnosis not present

## 2020-03-23 ENCOUNTER — Other Ambulatory Visit: Payer: Self-pay | Admitting: Unknown Physician Specialty

## 2020-03-23 ENCOUNTER — Telehealth: Payer: Self-pay | Admitting: Unknown Physician Specialty

## 2020-03-23 DIAGNOSIS — U071 COVID-19: Secondary | ICD-10-CM

## 2020-03-23 NOTE — Progress Notes (Signed)
Sx onset 9/5

## 2020-03-23 NOTE — Telephone Encounter (Signed)
I connected by phone with [name] on 03/23/2020 at 5:58 PM to discuss the potential use of a new treatment for mild to moderate COVID-19 viral infection in non-hospitalized patients.  This patient is a 74 y.o. female that meets the FDA criteria for Emergency Use Authorization of COVID monoclonal antibody casirivimab/imdevimab.  Has a (+) direct SARS-CoV-2 viral test result  Has mild or moderate COVID-19   Is NOT hospitalized due to COVID-19  Is within 10 days of symptom onset  Has at least one of the high risk factor(s) for progression to severe COVID-19 and/or hospitalization as defined in [place].  Specific high risk criteria : Older age (>/= 75 yo)   I have spoken and communicated the following to the patient or parent/caregiver regarding COVID monoclonal antibody treatment:  1. FDA has authorized the emergency use for the treatment of mild to moderate COVID-19 in adults and pediatric patients with positive results of direct SARS-CoV-2 viral testing who are 75 years of age and older weighing at least 40 kg, and who are at high risk for progressing to severe COVID-19 and/or hospitalization.  2. The significant known and potential risks and benefits of COVID monoclonal antibody, and the extent to which such potential risks and benefits are unknown.  3. Information on available alternative treatments and the risks and benefits of those alternatives, including clinical trials.  4. Patients treated with COVID monoclonal antibody should continue to self-isolate and use infection control measures (e.g., wear mask, isolate, social distance, avoid sharing personal items, clean and disinfect "high touch" surfaces, and frequent handwashing) according to CDC guidelines.   5. The patient or parent/caregiver has the option to accept or refuse COVID monoclonal antibody treatment.  After reviewing this information with the patient, The patient agreed to proceed with receiving casirivimab\imdevimab  infusion and will be provided a copy of the Fact sheet prior to receiving the infusion. Kathrine Haddock 03/23/2020 5:58 PM Sx onset 9/5

## 2020-03-24 ENCOUNTER — Ambulatory Visit (HOSPITAL_COMMUNITY)
Admission: RE | Admit: 2020-03-24 | Discharge: 2020-03-24 | Disposition: A | Discharge status: Home / Self Care | Payer: Medicare Other | Source: Ambulatory Visit | Attending: Pulmonary Disease | Admitting: Pulmonary Disease

## 2020-03-24 DIAGNOSIS — Z23 Encounter for immunization: Secondary | ICD-10-CM | POA: Insufficient documentation

## 2020-03-24 DIAGNOSIS — U071 COVID-19: Secondary | ICD-10-CM | POA: Insufficient documentation

## 2020-03-24 MED ORDER — SODIUM CHLORIDE 0.9 % IV SOLN
1200.0000 mg | Freq: Once | INTRAVENOUS | Status: AC
  Administered 2020-03-24: 1200 mg via INTRAVENOUS
  Filled 2020-03-24: qty 10

## 2020-03-24 MED ORDER — SODIUM CHLORIDE 0.9 % IV SOLN: INTRAVENOUS | Status: DC | PRN

## 2020-03-24 MED ORDER — FAMOTIDINE IN NACL 20-0.9 MG/50ML-% IV SOLN: 20.0000 mg | Freq: Once | INTRAVENOUS | Status: DC | PRN

## 2020-03-24 MED ORDER — DIPHENHYDRAMINE HCL 50 MG/ML IJ SOLN: 50.0000 mg | Freq: Once | INTRAMUSCULAR | Status: DC | PRN

## 2020-03-24 MED ORDER — ALBUTEROL SULFATE HFA 108 (90 BASE) MCG/ACT IN AERS: 2.0000 | INHALATION_SPRAY | Freq: Once | RESPIRATORY_TRACT | Status: DC | PRN

## 2020-03-24 MED ORDER — ONDANSETRON HCL 4 MG/2ML IJ SOLN
4.0000 mg | Freq: Once | INTRAMUSCULAR | Status: AC
  Administered 2020-03-24: 4 mg via INTRAVENOUS
  Filled 2020-03-24: qty 2

## 2020-03-24 MED ORDER — METHYLPREDNISOLONE SODIUM SUCC 125 MG IJ SOLR: 125.0000 mg | Freq: Once | INTRAMUSCULAR | Status: DC | PRN

## 2020-03-24 MED ORDER — EPINEPHRINE 0.3 MG/0.3ML IJ SOAJ: 0.3000 mg | Freq: Once | INTRAMUSCULAR | Status: DC | PRN

## 2020-03-24 NOTE — Discharge Instructions (Signed)

## 2020-03-24 NOTE — Progress Notes (Signed)
  Diagnosis: COVID-19  Physician:Dr Joya Gaskins   Procedure: [facility] Med: casirivimab\imdevimab infusion - Provided patient with casirivimab\imdevimab fact sheet for patients, parents and caregivers prior to infusion.  Complications: No immediate complications noted.  Discharge: Discharged home   Dewaine Oats 03/24/2020

## 2020-04-03 DIAGNOSIS — E7800 Pure hypercholesterolemia, unspecified: Secondary | ICD-10-CM | POA: Diagnosis not present

## 2020-04-03 DIAGNOSIS — M1990 Unspecified osteoarthritis, unspecified site: Secondary | ICD-10-CM | POA: Diagnosis not present

## 2020-04-03 DIAGNOSIS — M1612 Unilateral primary osteoarthritis, left hip: Secondary | ICD-10-CM | POA: Diagnosis not present

## 2020-05-02 DIAGNOSIS — M1612 Unilateral primary osteoarthritis, left hip: Secondary | ICD-10-CM | POA: Diagnosis not present

## 2020-05-02 DIAGNOSIS — E7800 Pure hypercholesterolemia, unspecified: Secondary | ICD-10-CM | POA: Diagnosis not present

## 2020-06-13 DIAGNOSIS — M1990 Unspecified osteoarthritis, unspecified site: Secondary | ICD-10-CM | POA: Diagnosis not present

## 2020-06-13 DIAGNOSIS — E7800 Pure hypercholesterolemia, unspecified: Secondary | ICD-10-CM | POA: Diagnosis not present

## 2020-06-13 DIAGNOSIS — M25551 Pain in right hip: Secondary | ICD-10-CM | POA: Diagnosis not present

## 2020-06-13 DIAGNOSIS — K219 Gastro-esophageal reflux disease without esophagitis: Secondary | ICD-10-CM | POA: Diagnosis not present

## 2020-06-13 DIAGNOSIS — M1612 Unilateral primary osteoarthritis, left hip: Secondary | ICD-10-CM | POA: Diagnosis not present

## 2020-06-20 DIAGNOSIS — E7800 Pure hypercholesterolemia, unspecified: Secondary | ICD-10-CM | POA: Diagnosis not present

## 2020-06-20 DIAGNOSIS — M1990 Unspecified osteoarthritis, unspecified site: Secondary | ICD-10-CM | POA: Diagnosis not present

## 2020-06-20 DIAGNOSIS — M1612 Unilateral primary osteoarthritis, left hip: Secondary | ICD-10-CM | POA: Diagnosis not present

## 2020-06-20 DIAGNOSIS — K219 Gastro-esophageal reflux disease without esophagitis: Secondary | ICD-10-CM | POA: Diagnosis not present

## 2020-07-21 DIAGNOSIS — M1611 Unilateral primary osteoarthritis, right hip: Secondary | ICD-10-CM | POA: Diagnosis not present

## 2020-08-01 DIAGNOSIS — Z1231 Encounter for screening mammogram for malignant neoplasm of breast: Secondary | ICD-10-CM | POA: Diagnosis not present

## 2020-09-05 DIAGNOSIS — H524 Presbyopia: Secondary | ICD-10-CM | POA: Diagnosis not present

## 2020-09-10 NOTE — Progress Notes (Signed)
Cardiology Office Note:    Date:  09/11/2020   ID:  [name], DOB 1945-08-26, MRN [id number]  PCP:  Shirline Frees, MD  Cardiologist:  No primary care provider on file.  Electrophysiologist:  None   Referring MD: Shirline Frees, MD   Chief Complaint  Patient presents with  . Chest Pain    History of Present Illness:    [name] is a 75 y.o. female with a hx of hyperlipidemia who is referred by Dr. Kenton Kingfisher for evaluation of chest pain and palpitations.  She reports that she started having chest pain in January 2022.  States that it occurred for 3 to 4 days and then resolved.  Describes dull aching pain in center of her chest, would last for 2 to 3 hours.  Unclear what brings it on, and has not noted any relationship with exertion.  She denies any shortness of breath but reports she feels tired and weak.  Also reports intermittent lightheadedness but denies any syncope.  Reports she is having episodes few times per week where she feels like her heart is racing.  Reports lightheadedness during episodes.  She denies any lower extremity edema.  Reports BP has been labile, as low as 90s over 50s and as high as 140s over 80s.  She has been taking propranolol but feels very fatigued on the medication.  No smoking history.  Family history includes father had valve replacement in 22s.    Past Medical History:  Diagnosis Date  . Allergy   . Arthritis    oa  . Cataract of both eyes   . Cervical dysplasia   . Diarrhea    off and on last 3 months 1 episode today  . Dry eyes   . Fluid retention    slight leg swelling at times  . GERD (gastroesophageal reflux disease)   . Legally blind in left eye, as defined in [place]    since birth   . Low back pain   . Lumbar herniated disc   . Numbness and tingling    feet bilat   . Osteopenia 06/2015    T score -1.7  . Syncope    none recent  . Urinary frequency   . Urinary hesitancy   . Wears glasses     Past Surgical History:   Procedure Laterality Date  . COLONOSCOPY    . LUMBAR LAMINECTOMY     2009 secondary to ruptured disc;   . TOTAL HIP ARTHROPLASTY Left 03/25/2018   Procedure: LEFT TOTAL HIP ARTHROPLASTY ANTERIOR APPROACH;  Surgeon: Gaynelle Arabian, MD;  Location: [facility];  Service: Orthopedics;  Laterality: Left;  . TOTAL KNEE ARTHROPLASTY Left 08/02/2015   Procedure: LEFT TOTAL KNEE ARTHROPLASTY;  Surgeon: Gaynelle Arabian, MD;  Location: [facility];  Service: Orthopedics;  Laterality: Left;  . UPPER GASTROINTESTINAL ENDOSCOPY    . VAGINAL HYSTERECTOMY  1980 or 1981   partial    Current Medications: Current Meds  Medication Sig  . ALPRAZolam (XANAX) 0.25 MG tablet Take 0.25 mg by mouth 2 (two) times daily as needed for anxiety.   Marland Kitchen aspirin EC 81 MG tablet Take 81 mg by mouth daily.  . Calcium Carb-Cholecalciferol (CALCIUM 600+D3 PO) Take 1 tablet by mouth 2 (two) times daily.  . cholecalciferol (VITAMIN D) 1000 units tablet Take 1,000 Units by mouth every evening.  Marland Kitchen estradiol (ESTRACE) 0.5 MG tablet Take 1 tablet (0.5 mg total) by mouth daily. (Patient taking differently: Take 0.5 mg by mouth  every evening.)  . Multiple Vitamin (MULTIVITAMIN WITH MINERALS) TABS tablet Take 1 tablet by mouth daily. Senior Multivitamin  . Omega-3 Fatty Acids (FISH OIL) 1000 MG CAPS Take 1,000 mg by mouth 2 (two) times daily.  . pantoprazole (PROTONIX) 40 MG tablet Take 40 mg by mouth as needed.   . potassium chloride SA (K-DUR,KLOR-CON) 20 MEQ tablet Take 20 mEq by mouth every Monday, Wednesday, and Friday.  . propranolol (INDERAL) 20 MG tablet Take 20 mg by mouth as needed.  . Red Yeast Rice 600 MG CAPS Take 600 mg by mouth 2 (two) times daily.  . temazepam (RESTORIL) 15 MG capsule Take 15 mg by mouth at bedtime.  . vitamin C (ASCORBIC ACID) 500 MG tablet Take 500 mg by mouth daily.  Marland Kitchen zinc gluconate 50 MG tablet Take 50 mg by mouth daily.  . [DISCONTINUED] budesonide (ENTOCORT EC) 3 MG 24 hr capsule Take 9 mg by mouth daily.    Current Facility-Administered Medications for the 09/11/20 encounter (Office Visit) with Donato Heinz, MD  Medication  . 0.9 %  sodium chloride infusion     Allergies:   Patient has no known allergies.   Social History   Socioeconomic History  . Marital status: Married    Spouse name: Not on file  . Number of children: Not on file  . Years of education: Not on file  . Highest education level: Not on file  Occupational History  . Not on file  Tobacco Use  . Smoking status: Never Smoker  . Smokeless tobacco: Never Used  Vaping Use  . Vaping Use: Never used  Substance and Sexual Activity  . Alcohol use: No    Alcohol/week: 0.0 standard drinks  . Drug use: No  . Sexual activity: Yes    Birth control/protection: Surgical    Comment: Hyst-1st intercourse 71 yo-1 partner  Other Topics Concern  . Not on file  Social History Narrative  . Not on file   Social Determinants of Health   Financial Resource Strain: Not on file  Food Insecurity: Not on file  Transportation Needs: Not on file  Physical Activity: Not on file  Stress: Not on file  Social Connections: Not on file     Family History: The patient's family history includes Cancer in her father; Colon cancer in her father; Diabetes in her brother and father; Heart disease in her father; Hypertension in her brother. There is no history of Esophageal cancer, Rectal cancer, or Stomach cancer.  ROS:   Please see the history of present illness.    All other systems reviewed and are negative.  EKGs/Labs/Other Studies Reviewed:    The following studies were reviewed today:   EKG:  EKG is ordered today.  The ekg ordered today demonstrates normal sinus rhythm, rate 71, low voltage, no ST abnormalities  Recent Labs: No results found for requested labs within last 8760 hours.  Recent Lipid Panel    Component Value Date/Time   CHOL 203 (H) 09/15/2009 0954   TRIG 72.0 09/15/2009 0954   HDL 60.60 09/15/2009  0954   CHOLHDL 3 09/15/2009 0954   VLDL 14.4 09/15/2009 0954   LDLCALC 118 (H) 08/29/2008 1150   LDLDIRECT 136.7 09/15/2009 0954    Physical Exam:    VS:  BP (!) 142/84   Pulse 71   Ht 5' 4.5" (1.638 m)   Wt 139 lb 3.2 oz (63.1 kg)   LMP 07/16/1979   SpO2 98%   BMI 23.52 kg/m  Wt Readings from Last 3 Encounters:  09/11/20 139 lb 3.2 oz (63.1 kg)  08/10/19 137 lb 12.8 oz (62.5 kg)  06/01/19 137 lb 12.8 oz (62.5 kg)     YYT:KPTW nourished, well developed in no acute distress HEENT: Normal NECK: No JVD; No carotid bruits LYMPHATICS: No lymphadenopathy CARDIAC:RRR, no murmurs, rubs, gallops RESPIRATORY:  Clear to auscultation without rales, wheezing or rhonchi  ABDOMEN: Soft, non-tender, non-distended MUSCULOSKELETAL:  No edema; No deformity  SKIN: Warm and dry NEUROLOGIC:  Alert and oriented x 3 PSYCHIATRIC:  Normal affect   ASSESSMENT:    1. Chest pain of uncertain etiology   2. Palpitations   3. Hyperlipidemia, unspecified hyperlipidemia type    PLAN:    Chest pain: Atypical in description but does have CAD risk factors (age, hyperlipidemia).  Recommend Lexiscan Myoview to evaluate for ischemia.  Check echocardiogram to evaluate for structural heart disease  Palpitations: Description concerning for arrhythmia, will evaluate with Zio patch x7 days.  She was started on propranolol for possible arrhythmia, will hold for now and follow-up results of Zio patch  Hyperlipidemia: Labs on 12/23/2019 showed LDL 136.  Not currently on statin, would consider calcium score for further stratification, will discuss in next clinic appointment  RTC in 3 months   .Shared Decision Making/Informed Consent The risks [chest pain, shortness of breath, cardiac arrhythmias, dizziness, blood pressure fluctuations, myocardial infarction, stroke/transient ischemic attack, nausea, vomiting, allergic reaction, radiation exposure, metallic taste sensation and life-threatening complications  (estimated to be 1 in 10,000)], benefits (risk stratification, diagnosing coronary artery disease, treatment guidance) and alternatives of a nuclear stress test were discussed in detail with [name] and she agrees to proceed.     Medication Adjustments/Labs and Tests Ordered: Current medicines are reviewed at length with the patient today.  Concerns regarding medicines are outlined above.  Orders Placed This Encounter  Procedures  . MYOCARDIAL PERFUSION IMAGING  . LONG TERM MONITOR (3-14 DAYS)  . EKG 12-Lead  . ECHOCARDIOGRAM COMPLETE   No orders of the defined types were placed in this encounter.   Patient Instructions  Medication Instructions:  Hold propanolol  Testing/Procedures: Your physician has requested that you have an echocardiogram. Echocardiography is a painless test that uses sound waves to create images of your heart. It provides your doctor with information about the size and shape of your heart and how well your heart's chambers and valves are working. This procedure takes approximately one hour. There are no restrictions for this procedure. This will be done at our [facility] location:  [place] has requested that you have a lexiscan myoview. For further information please visit [email or web address]. Please follow instruction sheet, as given. This will take place at [address], [address]  How to prepare for your Myocardial Perfusion Test:  Do not eat or drink 3 hours prior to your test, except you may have water.  Do not consume products containing caffeine (regular or decaffeinated) 12 hours prior to your test. (ex: coffee, chocolate, sodas, tea).  Do bring a list of your current medications with you.  If not listed below, you may take your medications as normal.  Do wear comfortable clothes (no dresses or overalls) and walking shoes, tennis shoes preferred (No heels or open toe shoes are allowed).  Do NOT wear  cologne, perfume, aftershave, or lotions (deodorant is allowed).  The test will take approximately 3 to 4 hours to complete  If these instructions are  not followed, your test will have to be rescheduled.   ZIO XT- Long Term Monitor Instructions   Your physician has requested you wear your ZIO patch monitor 7 days.   This is a single patch monitor.  [employer] supplies one patch monitor per enrollment.  Additional stickers are not available.   Please do not apply patch if you will be having a Nuclear Stress Test, Echocardiogram, Cardiac CT, MRI, or Chest Xray during the time frame you would be wearing the monitor. The patch cannot be worn during these tests.  You cannot remove and re-apply the ZIO XT patch monitor.   Your ZIO patch monitor will be sent USPS Priority mail from [facility] directly to your home address. The monitor may also be mailed to a PO BOX if home delivery is not available.   It may take 3-5 days to receive your monitor after you have been enrolled.   Once you have received you monitor, please review enclosed instructions.  Your monitor has already been registered assigning a specific monitor serial # to you.   Applying the monitor   Shave hair from upper left chest.   Hold abrader disc by orange tab.  Rub abrader in 40 strokes over left upper chest as indicated in your monitor instructions.   Clean area with 4 enclosed alcohol pads .  Use all pads to assure are is cleaned thoroughly.  Let dry.   Apply patch as indicated in monitor instructions.  Patch will be place under collarbone on left side of chest with arrow pointing upward.   Rub patch adhesive wings for 2 minutes.Remove white label marked "1".  Remove white label marked "2".  Rub patch adhesive wings for 2 additional minutes.   While looking in a mirror, press and release button in center of patch.  A small green light will flash 3-4 times .  This will be your only indicator the monitor has been  turned on.     Do not shower for the first 24 hours.  You may shower after the first 24 hours.   Press button if you feel a symptom. You will hear a small click.  Record Date, Time and Symptom in the Patient Log Book.   When you are ready to remove patch, follow instructions on last 2 pages of Patient Log Book.  Stick patch monitor onto last page of Patient Log Book.   Place Patient Log Book in [place] box.  Use locking tab on box and tape box closed securely.  The [employer] has [employer] on it.  Please place in mailbox as soon as possible.  Your physician should have your test results approximately 7 days after the monitor has been mailed back to [facility].   Call [place] at [phone] if you have questions regarding your ZIO XT patch monitor.  Call them immediately if you see an orange light blinking on your monitor.   If your monitor falls off in less than 4 days contact our Monitor department at [phone].  If your monitor becomes loose or falls off after 4 days call [employer] at [phone] for suggestions on securing your monitor.    Follow-Up: At [facility], you and your health needs are our priority.  As part of our continuing mission to provide you with exceptional heart care, we have created designated Provider Care Teams.  These Care Teams include your primary Cardiologist (physician) and Advanced Practice Providers (APPs -  Physician Assistants  and Nurse Practitioners) who all work together to provide you with the care you need, when you need it.  We recommend signing up for the patient portal called "MyChart".  Sign up information is provided on this After Visit Summary.  MyChart is used to connect with patients for Virtual Visits (Telemedicine).  Patients are able to view lab/test results, encounter notes, upcoming appointments, etc.  Non-urgent messages can be sent to your provider as well.   To learn more about what you can do with  MyChart, go to [email or web address].    Your next appointment:   3 month(s)  The format for your next appointment:   In Person  Provider:   Oswaldo Milian, MD        Signed, Donato Heinz, MD  09/11/2020 6:01 PM    [place]

## 2020-09-11 ENCOUNTER — Encounter: Payer: Self-pay | Admitting: *Deleted

## 2020-09-11 ENCOUNTER — Ambulatory Visit (INDEPENDENT_AMBULATORY_CARE_PROVIDER_SITE_OTHER): Payer: Medicare Other

## 2020-09-11 ENCOUNTER — Encounter: Payer: Self-pay | Admitting: Cardiology

## 2020-09-11 ENCOUNTER — Ambulatory Visit: Payer: Medicare Other | Admitting: Cardiology

## 2020-09-11 ENCOUNTER — Other Ambulatory Visit: Payer: Self-pay

## 2020-09-11 VITALS — BP 142/84 | HR 71 | Ht 64.5 in | Wt 139.2 lb

## 2020-09-11 DIAGNOSIS — E785 Hyperlipidemia, unspecified: Secondary | ICD-10-CM | POA: Diagnosis not present

## 2020-09-11 DIAGNOSIS — R079 Chest pain, unspecified: Secondary | ICD-10-CM | POA: Diagnosis not present

## 2020-09-11 DIAGNOSIS — R002 Palpitations: Secondary | ICD-10-CM

## 2020-09-11 NOTE — Progress Notes (Signed)
Patient ID: [name], female   DOB: 1945-07-29, 75 y.o.   MRN: [id number] Patient enrolled for [employer] to ship a 7 day ZIO XT long term holter monitor to her home.

## 2020-09-11 NOTE — Patient Instructions (Signed)
Medication Instructions:  Hold propanolol  Testing/Procedures: Your physician has requested that you have an echocardiogram. Echocardiography is a painless test that uses sound waves to create images of your heart. It provides your doctor with information about the size and shape of your heart and how well your heart's chambers and valves are working. This procedure takes approximately one hour. There are no restrictions for this procedure. This will be done at our [facility] location:  [place] has requested that you have a lexiscan myoview. For further information please visit [email or web address]. Please follow instruction sheet, as given. This will take place at [place], [address]  How to prepare for your Myocardial Perfusion Test:  Do not eat or drink 3 hours prior to your test, except you may have water.  Do not consume products containing caffeine (regular or decaffeinated) 12 hours prior to your test. (ex: coffee, chocolate, sodas, tea).  Do bring a list of your current medications with you.  If not listed below, you may take your medications as normal.  Do wear comfortable clothes (no dresses or overalls) and walking shoes, tennis shoes preferred (No heels or open toe shoes are allowed).  Do NOT wear cologne, perfume, aftershave, or lotions (deodorant is allowed).  The test will take approximately 3 to 4 hours to complete  If these instructions are not followed, your test will have to be rescheduled.   ZIO XT- Long Term Monitor Instructions   Your physician has requested you wear your ZIO patch monitor 7 days.   This is a single patch monitor.  [employer] supplies one patch monitor per enrollment.  Additional stickers are not available.   Please do not apply patch if you will be having a Nuclear Stress Test, Echocardiogram, Cardiac CT, MRI, or Chest Xray during the time frame you would be wearing the monitor. The patch cannot be  worn during these tests.  You cannot remove and re-apply the ZIO XT patch monitor.   Your ZIO patch monitor will be sent USPS Priority mail from [facility] directly to your home address. The monitor may also be mailed to a PO BOX if home delivery is not available.   It may take 3-5 days to receive your monitor after you have been enrolled.   Once you have received you monitor, please review enclosed instructions.  Your monitor has already been registered assigning a specific monitor serial # to you.   Applying the monitor   Shave hair from upper left chest.   Hold abrader disc by orange tab.  Rub abrader in 40 strokes over left upper chest as indicated in your monitor instructions.   Clean area with 4 enclosed alcohol pads .  Use all pads to assure are is cleaned thoroughly.  Let dry.   Apply patch as indicated in monitor instructions.  Patch will be place under collarbone on left side of chest with arrow pointing upward.   Rub patch adhesive wings for 2 minutes.Remove white label marked "1".  Remove white label marked "2".  Rub patch adhesive wings for 2 additional minutes.   While looking in a mirror, press and release button in center of patch.  A small green light will flash 3-4 times .  This will be your only indicator the monitor has been turned on.     Do not shower for the first 24 hours.  You may shower after the first 24 hours.   Press button  if you feel a symptom. You will hear a small click.  Record Date, Time and Symptom in the Patient Log Book.   When you are ready to remove patch, follow instructions on last 2 pages of Patient Log Book.  Stick patch monitor onto last page of Patient Log Book.   Place Patient Log Book in [place] box.  Use locking tab on box and tape box closed securely.  The [employer] has [employer] on it.  Please place in mailbox as soon as possible.  Your physician should have your test results approximately 7 days after the monitor  has been mailed back to [facility].   Call [place] at [phone] if you have questions regarding your ZIO XT patch monitor.  Call them immediately if you see an orange light blinking on your monitor.   If your monitor falls off in less than 4 days contact our Monitor department at [phone].  If your monitor becomes loose or falls off after 4 days call [employer] at [phone] for suggestions on securing your monitor.    Follow-Up: At [facility], you and your health needs are our priority.  As part of our continuing mission to provide you with exceptional heart care, we have created designated Provider Care Teams.  These Care Teams include your primary Cardiologist (physician) and Advanced Practice Providers (APPs -  Physician Assistants and Nurse Practitioners) who all work together to provide you with the care you need, when you need it.  We recommend signing up for the patient portal called "MyChart".  Sign up information is provided on this After Visit Summary.  MyChart is used to connect with patients for Virtual Visits (Telemedicine).  Patients are able to view lab/test results, encounter notes, upcoming appointments, etc.  Non-urgent messages can be sent to your provider as well.   To learn more about what you can do with MyChart, go to [email or web address].    Your next appointment:   3 month(s)  The format for your next appointment:   In Person  Provider:   Oswaldo Milian, MD

## 2020-09-19 ENCOUNTER — Telehealth (HOSPITAL_COMMUNITY): Payer: Self-pay | Admitting: *Deleted

## 2020-09-19 NOTE — Telephone Encounter (Signed)
Close encounter 

## 2020-09-21 ENCOUNTER — Other Ambulatory Visit: Payer: Self-pay

## 2020-09-21 ENCOUNTER — Ambulatory Visit (HOSPITAL_COMMUNITY)
Admission: RE | Admit: 2020-09-21 | Discharge: 2020-09-21 | Disposition: A | Discharge status: Home / Self Care | Payer: Medicare Other | Source: Ambulatory Visit | Attending: Cardiology | Admitting: Cardiology

## 2020-09-21 DIAGNOSIS — R079 Chest pain, unspecified: Secondary | ICD-10-CM | POA: Diagnosis present

## 2020-09-21 LAB — MYOCARDIAL PERFUSION IMAGING
LV dias vol: 60 mL (ref 46–106)
LV sys vol: 16 mL
Peak HR: 106 {beats}/min
Rest HR: 74 {beats}/min
SDS: 0
SRS: 0
SSS: 0
TID: 0.91

## 2020-09-21 MED ORDER — REGADENOSON 0.4 MG/5ML IV SOLN
0.4000 mg | Freq: Once | INTRAVENOUS | Status: AC
  Administered 2020-09-21: 0.4 mg via INTRAVENOUS

## 2020-09-21 MED ORDER — AMINOPHYLLINE 25 MG/ML IV SOLN
75.0000 mg | Freq: Once | INTRAVENOUS | Status: AC
  Administered 2020-09-21: 75 mg via INTRAVENOUS

## 2020-09-21 MED ORDER — TECHNETIUM TC 99M TETROFOSMIN IV KIT
31.8000 | PACK | Freq: Once | INTRAVENOUS | Status: AC | PRN
  Administered 2020-09-21: 31.8 via INTRAVENOUS
  Filled 2020-09-21: qty 32

## 2020-09-21 MED ORDER — TECHNETIUM TC 99M TETROFOSMIN IV KIT
10.1000 | PACK | Freq: Once | INTRAVENOUS | Status: AC | PRN
  Administered 2020-09-21: 10.1 via INTRAVENOUS
  Filled 2020-09-21: qty 11

## 2020-09-22 DIAGNOSIS — R002 Palpitations: Secondary | ICD-10-CM | POA: Diagnosis not present

## 2020-10-03 ENCOUNTER — Other Ambulatory Visit: Payer: Self-pay

## 2020-10-03 ENCOUNTER — Ambulatory Visit (HOSPITAL_COMMUNITY): Payer: Medicare Other | Attending: Cardiovascular Disease

## 2020-10-03 DIAGNOSIS — R002 Palpitations: Secondary | ICD-10-CM | POA: Insufficient documentation

## 2020-10-03 DIAGNOSIS — R079 Chest pain, unspecified: Secondary | ICD-10-CM | POA: Diagnosis present

## 2020-10-03 LAB — ECHOCARDIOGRAM COMPLETE
Area-P 1/2: 2.49 cm2
S' Lateral: 2.8 cm

## 2020-12-11 NOTE — Progress Notes (Deleted)
Cardiology Office Note:    Date:  12/11/2020   ID:  [name], DOB 11-29-1945, MRN [id number]  PCP:  Shirline Frees, MD  Cardiologist:  None  Electrophysiologist:  None   Referring MD: Shirline Frees, MD   No chief complaint on file.   History of Present Illness:    [name] is a 75 y.o. female with a hx of hyperlipidemia who presents for follow-up.  She was referred by Dr. Kenton Kingfisher for evaluation of chest pain and palpitations, initially seen on 09/11/2020.  She reports that she started having chest pain in January 2022.  States that it occurred for 3 to 4 days and then resolved.  Describes dull aching pain in center of her chest, would last for 2 to 3 hours.  Unclear what brings it on, and has not noted any relationship with exertion.  She denies any shortness of breath but reports she feels tired and weak.  Also reports intermittent lightheadedness but denies any syncope.  Reports she is having episodes few times per week where she feels like her heart is racing.  Reports lightheadedness during episodes.  She denies any lower extremity edema.  Reports BP has been labile, as low as 90s over 50s and as high as 140s over 80s.  She has been taking propranolol but feels very fatigued on the medication.  No smoking history.  Family history includes father had valve replacement in 59s.  Lexiscan Myoview on 09/21/2020 showed normal perfusion, baseline ST depressions in inferior leads that worsened during stress, EF 74%.  Echocardiogram on 10/03/2020 showed normal biventricular function, no significant valvular disease.  Zio patch x7 days on 10/03/2020 showed 72 episodes of SVT, longest lasting 26 seconds.  Since last clinic visit,   Past Medical History:  Diagnosis Date  . Allergy   . Arthritis    oa  . Cataract of both eyes   . Cervical dysplasia   . Diarrhea    off and on last 3 months 1 episode today  . Dry eyes   . Fluid retention    slight leg swelling at times  . GERD  (gastroesophageal reflux disease)   . Legally blind in left eye, as defined in [place]    since birth   . Low back pain   . Lumbar herniated disc   . Numbness and tingling    feet bilat   . Osteopenia 06/2015    T score -1.7  . Syncope    none recent  . Urinary frequency   . Urinary hesitancy   . Wears glasses     Past Surgical History:  Procedure Laterality Date  . COLONOSCOPY    . LUMBAR LAMINECTOMY     2009 secondary to ruptured disc;   . TOTAL HIP ARTHROPLASTY Left 03/25/2018   Procedure: LEFT TOTAL HIP ARTHROPLASTY ANTERIOR APPROACH;  Surgeon: Gaynelle Arabian, MD;  Location: [facility];  Service: Orthopedics;  Laterality: Left;  . TOTAL KNEE ARTHROPLASTY Left 08/02/2015   Procedure: LEFT TOTAL KNEE ARTHROPLASTY;  Surgeon: Gaynelle Arabian, MD;  Location: [facility];  Service: Orthopedics;  Laterality: Left;  . UPPER GASTROINTESTINAL ENDOSCOPY    . VAGINAL HYSTERECTOMY  1980 or 1981   partial    Current Medications: No outpatient medications have been marked as taking for the 12/13/20 encounter (Appointment) with Donato Heinz, MD.   Current Facility-Administered Medications for the 12/13/20 encounter (Appointment) with Donato Heinz, MD  Medication  . 0.9 %  sodium chloride infusion  Allergies:   Patient has no known allergies.   Social History   Socioeconomic History  . Marital status: Married    Spouse name: Not on file  . Number of children: Not on file  . Years of education: Not on file  . Highest education level: Not on file  Occupational History  . Not on file  Tobacco Use  . Smoking status: Never Smoker  . Smokeless tobacco: Never Used  Vaping Use  . Vaping Use: Never used  Substance and Sexual Activity  . Alcohol use: No    Alcohol/week: 0.0 standard drinks  . Drug use: No  . Sexual activity: Yes    Birth control/protection: Surgical    Comment: Hyst-1st intercourse 69 yo-1 partner  Other Topics Concern  . Not on file  Social History  Narrative  . Not on file   Social Determinants of Health   Financial Resource Strain: Not on file  Food Insecurity: Not on file  Transportation Needs: Not on file  Physical Activity: Not on file  Stress: Not on file  Social Connections: Not on file     Family History: The patient's family history includes Cancer in her father; Colon cancer in her father; Diabetes in her brother and father; Heart disease in her father; Hypertension in her brother. There is no history of Esophageal cancer, Rectal cancer, or Stomach cancer.  ROS:   Please see the history of present illness.    All other systems reviewed and are negative.  EKGs/Labs/Other Studies Reviewed:    The following studies were reviewed today:   EKG:  EKG is ordered today.  The ekg ordered today demonstrates normal sinus rhythm, rate 71, low voltage, no ST abnormalities  Recent Labs: No results found for requested labs within last 8760 hours.  Recent Lipid Panel    Component Value Date/Time   CHOL 203 (H) 09/15/2009 0954   TRIG 72.0 09/15/2009 0954   HDL 60.60 09/15/2009 0954   CHOLHDL 3 09/15/2009 0954   VLDL 14.4 09/15/2009 0954   LDLCALC 118 (H) 08/29/2008 1150   LDLDIRECT 136.7 09/15/2009 0954    Physical Exam:    VS:  LMP 07/16/1979     Wt Readings from Last 3 Encounters:  09/21/20 139 lb (63 kg)  09/11/20 139 lb 3.2 oz (63.1 kg)  08/10/19 137 lb 12.8 oz (62.5 kg)     BZJ:IRCV nourished, well developed in no acute distress HEENT: Normal NECK: No JVD; No carotid bruits LYMPHATICS: No lymphadenopathy CARDIAC:RRR, no murmurs, rubs, gallops RESPIRATORY:  Clear to auscultation without rales, wheezing or rhonchi  ABDOMEN: Soft, non-tender, non-distended MUSCULOSKELETAL:  No edema; No deformity  SKIN: Warm and dry NEUROLOGIC:  Alert and oriented x 3 PSYCHIATRIC:  Normal affect   ASSESSMENT:    No diagnosis found. PLAN:    Chest pain: Atypical in description but does have CAD risk factors (age,  hyperlipidemia).  Lexiscan Myoview on 09/21/2020 showed normal perfusion, baseline ST depressions in inferior leads that worsened during stress, EF 74%.  Echocardiogram on 10/03/2020 showed normal biventricular function, no significant valvular disease.   -No further cardiac work-up recommended  Palpitations: Zio patch x7 days on 10/03/2020 showed 72 episodes of SVT, longest lasting 26 seconds.  She has been taking as needed propranolol, will stop propranolol and start Toprol-XL 25 mg daily  Hyperlipidemia: Labs on 12/23/2019 showed LDL 136.  Not currently on statin, would recommend calcium score for further risk stratification.  Will recheck lipid panel  RTC in 6  months     Medication Adjustments/Labs and Tests Ordered: Current medicines are reviewed at length with the patient today.  Concerns regarding medicines are outlined above.  No orders of the defined types were placed in this encounter.  No orders of the defined types were placed in this encounter.   There are no Patient Instructions on file for this visit.   Signed, Donato Heinz, MD  12/11/2020 11:22 AM    [place]

## 2020-12-13 ENCOUNTER — Other Ambulatory Visit: Payer: Self-pay

## 2020-12-13 ENCOUNTER — Encounter: Payer: Self-pay | Admitting: Cardiology

## 2020-12-13 ENCOUNTER — Ambulatory Visit: Payer: Medicare Other | Admitting: Cardiology

## 2020-12-13 VITALS — BP 132/70 | HR 72 | Ht 64.0 in | Wt 139.2 lb

## 2020-12-13 DIAGNOSIS — I471 Supraventricular tachycardia, unspecified: Secondary | ICD-10-CM

## 2020-12-13 DIAGNOSIS — R002 Palpitations: Secondary | ICD-10-CM

## 2020-12-13 DIAGNOSIS — E785 Hyperlipidemia, unspecified: Secondary | ICD-10-CM

## 2020-12-13 DIAGNOSIS — R079 Chest pain, unspecified: Secondary | ICD-10-CM

## 2020-12-13 LAB — BASIC METABOLIC PANEL WITH GFR
BUN/Creatinine Ratio: 16 (ref 12–28)
BUN: 12 mg/dL (ref 8–27)
CO2: 26 mmol/L (ref 20–29)
Calcium: 9.7 mg/dL (ref 8.7–10.3)
Chloride: 102 mmol/L (ref 96–106)
Creatinine, Ser: 0.76 mg/dL (ref 0.57–1.00)
Glucose: 82 mg/dL (ref 65–99)
Potassium: 4.5 mmol/L (ref 3.5–5.2)
Sodium: 140 mmol/L (ref 134–144)
eGFR: 82 mL/min/{1.73_m2}

## 2020-12-13 LAB — LIPID PANEL
Chol/HDL Ratio: 3.2 ratio (ref 0.0–4.4)
Cholesterol, Total: 178 mg/dL (ref 100–199)
HDL: 56 mg/dL
LDL Chol Calc (NIH): 98 mg/dL (ref 0–99)
Triglycerides: 135 mg/dL (ref 0–149)
VLDL Cholesterol Cal: 24 mg/dL (ref 5–40)

## 2020-12-13 MED ORDER — METOPROLOL SUCCINATE ER 25 MG PO TB24: 25.0000 mg | ORAL_TABLET | Freq: Every day | ORAL | 3 refills | Status: DC

## 2020-12-13 NOTE — Progress Notes (Signed)
Cardiology Office Note:    Date:  12/13/2020   ID:  [name], DOB 11-Aug-1945, MRN [id number]  PCP:  Shirline Frees, MD  Cardiologist:  None  Electrophysiologist:  None   Referring MD: Shirline Frees, MD   Chief Complaint  Patient presents with  . Chest Pain    History of Present Illness:    [name] is a 75 y.o. female with a hx of hyperlipidemia who presents for follow-up.  She was referred by Dr. Kenton Kingfisher for evaluation of chest pain and palpitations, initially seen on 09/11/2020.  She reports that she started having chest pain in January 2022.  States that it occurred for 3 to 4 days and then resolved.  Describes dull aching pain in center of her chest, would last for 2 to 3 hours.  Unclear what brings it on, and has not noted any relationship with exertion.  She denies any shortness of breath but reports she feels tired and weak.  Also reports intermittent lightheadedness but denies any syncope.  Reports she is having episodes few times per week where she feels like her heart is racing.  Reports lightheadedness during episodes.  She denies any lower extremity edema.  Reports BP has been labile, as low as 90s over 50s and as high as 140s over 80s.  She has been taking propranolol but feels very fatigued on the medication.  No smoking history.  Family history includes father had valve replacement in 58s.  Lexiscan Myoview on 09/21/2020 showed normal perfusion, baseline ST depressions in inferior leads that worsened during stress, EF 74%.  Echocardiogram on 10/03/2020 showed normal biventricular function, no significant valvular disease.  Zio patch x7 days on 10/03/2020 showed 72 episodes of SVT, longest lasting 26 seconds.  Since last clinic visit, she is doing well. She states that she experiences chest pain. She believes that the cause is heart burn from consuming chocolate. She also reports experiences palpitations that she believes is due to over exerting herself. She denies any  lightheadedness, syncope, or lower extremity edema.     Past Medical History:  Diagnosis Date  . Allergy   . Arthritis    oa  . Cataract of both eyes   . Cervical dysplasia   . Diarrhea    off and on last 3 months 1 episode today  . Dry eyes   . Fluid retention    slight leg swelling at times  . GERD (gastroesophageal reflux disease)   . Legally blind in left eye, as defined in [place]    since birth   . Low back pain   . Lumbar herniated disc   . Numbness and tingling    feet bilat   . Osteopenia 06/2015    T score -1.7  . Syncope    none recent  . Urinary frequency   . Urinary hesitancy   . Wears glasses     Past Surgical History:  Procedure Laterality Date  . COLONOSCOPY    . LUMBAR LAMINECTOMY     2009 secondary to ruptured disc;   . TOTAL HIP ARTHROPLASTY Left 03/25/2018   Procedure: LEFT TOTAL HIP ARTHROPLASTY ANTERIOR APPROACH;  Surgeon: Gaynelle Arabian, MD;  Location: [facility];  Service: Orthopedics;  Laterality: Left;  . TOTAL KNEE ARTHROPLASTY Left 08/02/2015   Procedure: LEFT TOTAL KNEE ARTHROPLASTY;  Surgeon: Gaynelle Arabian, MD;  Location: [facility];  Service: Orthopedics;  Laterality: Left;  . UPPER GASTROINTESTINAL ENDOSCOPY    . VAGINAL HYSTERECTOMY  1980 or  1981   partial    Current Medications: Current Meds  Medication Sig  . ALPRAZolam (XANAX) 0.25 MG tablet Take 0.25 mg by mouth 2 (two) times daily as needed for anxiety.   Marland Kitchen aspirin EC 81 MG tablet Take 81 mg by mouth daily.  . Calcium Carb-Cholecalciferol (CALCIUM 600+D3 PO) Take 1 tablet by mouth 2 (two) times daily.  . cholecalciferol (VITAMIN D) 1000 units tablet Take 1,000 Units by mouth every evening.  Marland Kitchen estradiol (ESTRACE) 0.5 MG tablet Take by mouth.  . metoprolol succinate (TOPROL XL) 25 MG 24 hr tablet Take 1 tablet (25 mg total) by mouth daily.  . Multiple Vitamin (MULTIVITAMIN WITH MINERALS) TABS tablet Take 1 tablet by mouth daily. Senior Multivitamin  . Omega-3 Fatty Acids (FISH OIL) 1000 MG  CAPS Take 1,000 mg by mouth 2 (two) times daily.  . pantoprazole (PROTONIX) 40 MG tablet Take 40 mg by mouth as needed.   . potassium chloride SA (K-DUR,KLOR-CON) 20 MEQ tablet Take 20 mEq by mouth every Monday, Wednesday, and Friday.  . Red Yeast Rice 600 MG CAPS Take 600 mg by mouth 2 (two) times daily.  . temazepam (RESTORIL) 15 MG capsule Take 15 mg by mouth at bedtime.  . vitamin C (ASCORBIC ACID) 500 MG tablet Take 500 mg by mouth daily.  Marland Kitchen zinc gluconate 50 MG tablet Take 50 mg by mouth daily.  . [DISCONTINUED] propranolol (INDERAL) 20 MG tablet Take 20 mg by mouth as needed.   Current Facility-Administered Medications for the 12/13/20 encounter (Office Visit) with Donato Heinz, MD  Medication  . 0.9 %  sodium chloride infusion     Allergies:   Patient has no known allergies.   Social History   Socioeconomic History  . Marital status: Married    Spouse name: Not on file  . Number of children: Not on file  . Years of education: Not on file  . Highest education level: Not on file  Occupational History  . Not on file  Tobacco Use  . Smoking status: Never Smoker  . Smokeless tobacco: Never Used  Vaping Use  . Vaping Use: Never used  Substance and Sexual Activity  . Alcohol use: No    Alcohol/week: 0.0 standard drinks  . Drug use: No  . Sexual activity: Yes    Birth control/protection: Surgical    Comment: Hyst-1st intercourse 45 yo-1 partner  Other Topics Concern  . Not on file  Social History Narrative  . Not on file   Social Determinants of Health   Financial Resource Strain: Not on file  Food Insecurity: Not on file  Transportation Needs: Not on file  Physical Activity: Not on file  Stress: Not on file  Social Connections: Not on file     Family History: The patient's family history includes Cancer in her father; Colon cancer in her father; Diabetes in her brother and father; Heart disease in her father; Hypertension in her brother. There is no  history of Esophageal cancer, Rectal cancer, or Stomach cancer.  ROS:   Please see the history of present illness.    All other systems reviewed and are negative.  EKGs/Labs/Other Studies Reviewed:    The following studies were reviewed today:   EKG:  12/13/2020- No EKG ordered  09/11/2020- The ekg ordered demonstrates normal sinus rhythm, rate 71, low voltage, no ST abnormalities  Recent Labs: No results found for requested labs within last 8760 hours.  Recent Lipid Panel    Component Value  Date/Time   CHOL 203 (H) 09/15/2009 0954   TRIG 72.0 09/15/2009 0954   HDL 60.60 09/15/2009 0954   CHOLHDL 3 09/15/2009 0954   VLDL 14.4 09/15/2009 0954   LDLCALC 118 (H) 08/29/2008 1150   LDLDIRECT 136.7 09/15/2009 0954    Physical Exam:    VS:  BP 132/70   Pulse 72   Ht 5\' 4"  (1.626 m)   Wt 139 lb 3.2 oz (63.1 kg)   LMP 07/16/1979   SpO2 98%   BMI 23.89 kg/m     Wt Readings from Last 3 Encounters:  12/13/20 139 lb 3.2 oz (63.1 kg)  09/21/20 139 lb (63 kg)  09/11/20 139 lb 3.2 oz (63.1 kg)     XYV:OPFY nourished, well developed in no acute distress HEENT: Normal NECK: No JVD; No carotid bruits LYMPHATICS: No lymphadenopathy CARDIAC:RRR, no murmurs, rubs, gallops RESPIRATORY:  Clear to auscultation without rales, wheezing or rhonchi  ABDOMEN: Soft, non-tender, non-distended MUSCULOSKELETAL:  No edema; No deformity  SKIN: Warm and dry NEUROLOGIC:  Alert and oriented x 3 PSYCHIATRIC:  Normal affect   ASSESSMENT:    1. Palpitations   2. SVT (supraventricular tachycardia) ([place])   3. Hyperlipidemia, unspecified hyperlipidemia type   4. Chest pain of uncertain etiology    PLAN:    Chest pain: Atypical in description but does have CAD risk factors (age, hyperlipidemia).  Lexiscan Myoview on 09/21/2020 showed normal perfusion, baseline ST depressions in inferior leads that worsened during stress, EF 74%.  Echocardiogram on 10/03/2020 showed normal biventricular function, no  significant valvular disease.   -No further cardiac work-up recommended  Palpitations/SVT: Zio patch x7 days on 10/03/2020 showed 72 episodes of SVT, longest lasting 26 seconds.  She has been taking as needed propranolol, will stop propranolol and start Toprol-XL 25 mg daily  Hyperlipidemia: Labs on 12/23/2019 showed LDL 136.  Not currently on statin, would recommend calcium score for further risk stratification.  Will recheck lipid panel  RTC in 6 months      Medication Adjustments/Labs and Tests Ordered: Current medicines are reviewed at length with the patient today.  Concerns regarding medicines are outlined above.  Orders Placed This Encounter  Procedures  . CT CARDIAC SCORING (SELF PAY ONLY)  . Basic metabolic panel  . Lipid panel   Meds ordered this encounter  Medications  . metoprolol succinate (TOPROL XL) 25 MG 24 hr tablet    Sig: Take 1 tablet (25 mg total) by mouth daily.    Dispense:  90 tablet    Refill:  3    Stop PRN propranolol    Patient Instructions  Medication Instructions:  STOP propranolol START metoprolol succinate (Toprol XL) 25 mg once daily  *If you need a refill on your cardiac medications before your next appointment, please call your pharmacy*   Lab Work: BMET, Lipid today  If you have labs (blood work) drawn today and your tests are completely normal, you will receive your results only by: Marland Kitchen MyChart Message (if you have MyChart) OR . A paper copy in the mail If you have any lab test that is abnormal or we need to change your treatment, we will call you to review the results.   Testing/Procedures: CT coronary calcium score. This test is done at [address]. This is $99 out of pocket.   Coronary CalciumScan A coronary calcium scan is an imaging test used to look for deposits of calcium and other fatty materials (plaques) in the  inner lining of the blood vessels of the heart (coronary arteries). These deposits of calcium  and plaques can partly clog and narrow the coronary arteries without producing any symptoms or warning signs. This puts a person at risk for a heart attack. This test can detect these deposits before symptoms develop. Tell a health care provider about:  Any allergies you have.  All medicines you are taking, including vitamins, herbs, eye drops, creams, and over-the-counter medicines.  Any problems you or family members have had with anesthetic medicines.  Any blood disorders you have.  Any surgeries you have had.  Any medical conditions you have.  Whether you are pregnant or may be pregnant. What are the risks? Generally, this is a safe procedure. However, problems may occur, including:  Harm to a pregnant woman and her unborn baby. This test involves the use of radiation. Radiation exposure can be dangerous to a pregnant woman and her unborn baby. If you are pregnant, you generally should not have this procedure done.  Slight increase in the risk of cancer. This is because of the radiation involved in the test. What happens before the procedure? No preparation is needed for this procedure. What happens during the procedure?  You will undress and remove any jewelry around your neck or chest.  You will put on a hospital gown.  Sticky electrodes will be placed on your chest. The electrodes will be connected to an electrocardiogram (ECG) machine to record a tracing of the electrical activity of your heart.  A CT scanner will take pictures of your heart. During this time, you will be asked to lie still and hold your breath for 2-3 seconds while a picture of your heart is being taken. The procedure may vary among health care providers and hospitals. What happens after the procedure?  You can get dressed.  You can return to your normal activities.  It is up to you to get the results of your test. Ask your health care provider, or the department that is doing the test, when your  results will be ready. Summary  A coronary calcium scan is an imaging test used to look for deposits of calcium and other fatty materials (plaques) in the inner lining of the blood vessels of the heart (coronary arteries).  Generally, this is a safe procedure. Tell your health care provider if you are pregnant or may be pregnant.  No preparation is needed for this procedure.  A CT scanner will take pictures of your heart.  You can return to your normal activities after the scan is done. This information is not intended to replace advice given to you by your health care provider. Make sure you discuss any questions you have with your health care provider. Document Released: 12/28/2007 Document Revised: 05/20/2016 Document Reviewed: 05/20/2016 Elsevier Interactive Patient Education  2017 [place]: At [facility], you and your health needs are our priority.  As part of our continuing mission to provide you with exceptional heart care, we have created designated Provider Care Teams.  These Care Teams include your primary Cardiologist (physician) and Advanced Practice Providers (APPs -  Physician Assistants and Nurse Practitioners) who all work together to provide you with the care you need, when you need it.  We recommend signing up for the patient portal called "MyChart".  Sign up information is provided on this After Visit Summary.  MyChart is used to connect with patients for Virtual Visits (Telemedicine).  Patients are able to  view lab/test results, encounter notes, upcoming appointments, etc.  Non-urgent messages can be sent to your provider as well.   To learn more about what you can do with MyChart, go to [email or web address].    Your next appointment:   6 month(s)  The format for your next appointment:   In Person  Provider:   Oswaldo Milian, MD        Ella Bodo as a scribe for Donato Heinz, MD.,have documented all  relevant documentation on the behalf of Donato Heinz, MD,as directed by  Donato Heinz, MD while in the presence of Donato Heinz, MD.  I, Donato Heinz, MD, have reviewed all documentation for this visit. The documentation on 12/13/20 for the exam, diagnosis, procedures, and orders are all accurate and complete.   Signed, Donato Heinz, MD  12/13/2020 10:44 AM    [place]

## 2020-12-13 NOTE — Progress Notes (Deleted)
Cardiology Office Note:    Date:  12/13/2020   ID:  [name], DOB 1946/01/08, MRN [id number]  PCP:  Shirline Frees, MD  Cardiologist:  None  Electrophysiologist:  None   Referring MD: Shirline Frees, MD   No chief complaint on file.   History of Present Illness:    [name] is a 75 y.o. female with a hx of hyperlipidemia who presents for follow-up.  She was referred by Dr. Kenton Kingfisher for evaluation of chest pain and palpitations, initially seen on 09/11/2020.  She reports that she started having chest pain in January 2022.  States that it occurred for 3 to 4 days and then resolved.  Describes dull aching pain in center of her chest, would last for 2 to 3 hours.  Unclear what brings it on, and has not noted any relationship with exertion.  She denies any shortness of breath but reports she feels tired and weak.  Also reports intermittent lightheadedness but denies any syncope.  Reports she is having episodes few times per week where she feels like her heart is racing.  Reports lightheadedness during episodes.  She denies any lower extremity edema.  Reports BP has been labile, as low as 90s over 50s and as high as 140s over 80s.  She has been taking propranolol but feels very fatigued on the medication.  No smoking history.  Family history includes father had valve replacement in 2s.  Lexiscan Myoview on 09/21/2020 showed normal perfusion, baseline ST depressions in inferior leads that worsened during stress, EF 74%.  Echocardiogram on 10/03/2020 showed normal biventricular function, no significant valvular disease.  Zio patch x7 days on 10/03/2020 showed 72 episodes of SVT, longest lasting 26 seconds.  Since last clinic visit,   Past Medical History:  Diagnosis Date  . Allergy   . Arthritis    oa  . Cataract of both eyes   . Cervical dysplasia   . Diarrhea    off and on last 3 months 1 episode today  . Dry eyes   . Fluid retention    slight leg swelling at times  . GERD  (gastroesophageal reflux disease)   . Legally blind in left eye, as defined in [place]    since birth   . Low back pain   . Lumbar herniated disc   . Numbness and tingling    feet bilat   . Osteopenia 06/2015    T score -1.7  . Syncope    none recent  . Urinary frequency   . Urinary hesitancy   . Wears glasses     Past Surgical History:  Procedure Laterality Date  . COLONOSCOPY    . LUMBAR LAMINECTOMY     2009 secondary to ruptured disc;   . TOTAL HIP ARTHROPLASTY Left 03/25/2018   Procedure: LEFT TOTAL HIP ARTHROPLASTY ANTERIOR APPROACH;  Surgeon: Gaynelle Arabian, MD;  Location: [facility];  Service: Orthopedics;  Laterality: Left;  . TOTAL KNEE ARTHROPLASTY Left 08/02/2015   Procedure: LEFT TOTAL KNEE ARTHROPLASTY;  Surgeon: Gaynelle Arabian, MD;  Location: [facility];  Service: Orthopedics;  Laterality: Left;  . UPPER GASTROINTESTINAL ENDOSCOPY    . VAGINAL HYSTERECTOMY  1980 or 1981   partial    Current Medications: Current Meds  Medication Sig  . ALPRAZolam (XANAX) 0.25 MG tablet Take 0.25 mg by mouth 2 (two) times daily as needed for anxiety.   Marland Kitchen aspirin EC 81 MG tablet Take 81 mg by mouth daily.  . Calcium Carb-Cholecalciferol (CALCIUM  600+D3 PO) Take 1 tablet by mouth 2 (two) times daily.  . cholecalciferol (VITAMIN D) 1000 units tablet Take 1,000 Units by mouth every evening.  Marland Kitchen estradiol (ESTRACE) 0.5 MG tablet Take by mouth.  . Multiple Vitamin (MULTIVITAMIN WITH MINERALS) TABS tablet Take 1 tablet by mouth daily. Senior Multivitamin  . Omega-3 Fatty Acids (FISH OIL) 1000 MG CAPS Take 1,000 mg by mouth 2 (two) times daily.  . pantoprazole (PROTONIX) 40 MG tablet Take 40 mg by mouth as needed.   . potassium chloride SA (K-DUR,KLOR-CON) 20 MEQ tablet Take 20 mEq by mouth every Monday, Wednesday, and Friday.  . propranolol (INDERAL) 20 MG tablet Take 20 mg by mouth as needed.  . Red Yeast Rice 600 MG CAPS Take 600 mg by mouth 2 (two) times daily.  . temazepam (RESTORIL) 15 MG  capsule Take 15 mg by mouth at bedtime.  . vitamin C (ASCORBIC ACID) 500 MG tablet Take 500 mg by mouth daily.  Marland Kitchen zinc gluconate 50 MG tablet Take 50 mg by mouth daily.   Current Facility-Administered Medications for the 12/13/20 encounter (Office Visit) with Donato Heinz, MD  Medication  . 0.9 %  sodium chloride infusion     Allergies:   Patient has no known allergies.   Social History   Socioeconomic History  . Marital status: Married    Spouse name: Not on file  . Number of children: Not on file  . Years of education: Not on file  . Highest education level: Not on file  Occupational History  . Not on file  Tobacco Use  . Smoking status: Never Smoker  . Smokeless tobacco: Never Used  Vaping Use  . Vaping Use: Never used  Substance and Sexual Activity  . Alcohol use: No    Alcohol/week: 0.0 standard drinks  . Drug use: No  . Sexual activity: Yes    Birth control/protection: Surgical    Comment: Hyst-1st intercourse 61 yo-1 partner  Other Topics Concern  . Not on file  Social History Narrative  . Not on file   Social Determinants of Health   Financial Resource Strain: Not on file  Food Insecurity: Not on file  Transportation Needs: Not on file  Physical Activity: Not on file  Stress: Not on file  Social Connections: Not on file     Family History: The patient's family history includes Cancer in her father; Colon cancer in her father; Diabetes in her brother and father; Heart disease in her father; Hypertension in her brother. There is no history of Esophageal cancer, Rectal cancer, or Stomach cancer.  ROS:   Please see the history of present illness.    All other systems reviewed and are negative.  EKGs/Labs/Other Studies Reviewed:    The following studies were reviewed today:   EKG:  EKG is ordered today.  The ekg ordered today demonstrates normal sinus rhythm, rate 71, low voltage, no ST abnormalities  Recent Labs: No results found for  requested labs within last 8760 hours.  Recent Lipid Panel    Component Value Date/Time   CHOL 203 (H) 09/15/2009 0954   TRIG 72.0 09/15/2009 0954   HDL 60.60 09/15/2009 0954   CHOLHDL 3 09/15/2009 0954   VLDL 14.4 09/15/2009 0954   LDLCALC 118 (H) 08/29/2008 1150   LDLDIRECT 136.7 09/15/2009 0954    Physical Exam:    VS:  BP 132/70   Pulse 72   Ht 5\' 4"  (1.626 m)   Wt 139 lb 3.2  oz (63.1 kg)   LMP 07/16/1979   SpO2 98%   BMI 23.89 kg/m     Wt Readings from Last 3 Encounters:  12/13/20 139 lb 3.2 oz (63.1 kg)  09/21/20 139 lb (63 kg)  09/11/20 139 lb 3.2 oz (63.1 kg)     WGY:KZLD nourished, well developed in no acute distress HEENT: Normal NECK: No JVD; No carotid bruits LYMPHATICS: No lymphadenopathy CARDIAC:RRR, no murmurs, rubs, gallops RESPIRATORY:  Clear to auscultation without rales, wheezing or rhonchi  ABDOMEN: Soft, non-tender, non-distended MUSCULOSKELETAL:  No edema; No deformity  SKIN: Warm and dry NEUROLOGIC:  Alert and oriented x 3 PSYCHIATRIC:  Normal affect   ASSESSMENT:    No diagnosis found. PLAN:    Chest pain: Atypical in description but does have CAD risk factors (age, hyperlipidemia).  Lexiscan Myoview on 09/21/2020 showed normal perfusion, baseline ST depressions in inferior leads that worsened during stress, EF 74%.  Echocardiogram on 10/03/2020 showed normal biventricular function, no significant valvular disease.   -No further cardiac work-up recommended  Palpitations: Zio patch x7 days on 10/03/2020 showed 72 episodes of SVT, longest lasting 26 seconds.  She has been taking as needed propranolol, will stop propranolol and start Toprol-XL 25 mg daily  Hyperlipidemia: Labs on 12/23/2019 showed LDL 136.  Not currently on statin, would recommend calcium score for further risk stratification.  Will recheck lipid panel  RTC in 6 months     Medication Adjustments/Labs and Tests Ordered: Current medicines are reviewed at length with the  patient today.  Concerns regarding medicines are outlined above.  No orders of the defined types were placed in this encounter.  No orders of the defined types were placed in this encounter.   Patient Instructions  Medication Instructions:  STOP propranolol START metoprolol succinate (Toprol XL) 25 mg once daily  *If you need a refill on your cardiac medications before your next appointment, please call your pharmacy*   Lab Work: BMET, Lipid today  If you have labs (blood work) drawn today and your tests are completely normal, you will receive your results only by: Marland Kitchen MyChart Message (if you have MyChart) OR . A paper copy in the mail If you have any lab test that is abnormal or we need to change your treatment, we will call you to review the results.   Testing/Procedures: CT coronary calcium score. This test is done at [address]. This is $99 out of pocket.   Coronary CalciumScan A coronary calcium scan is an imaging test used to look for deposits of calcium and other fatty materials (plaques) in the inner lining of the blood vessels of the heart (coronary arteries). These deposits of calcium and plaques can partly clog and narrow the coronary arteries without producing any symptoms or warning signs. This puts a person at risk for a heart attack. This test can detect these deposits before symptoms develop. Tell a health care provider about:  Any allergies you have.  All medicines you are taking, including vitamins, herbs, eye drops, creams, and over-the-counter medicines.  Any problems you or family members have had with anesthetic medicines.  Any blood disorders you have.  Any surgeries you have had.  Any medical conditions you have.  Whether you are pregnant or may be pregnant. What are the risks? Generally, this is a safe procedure. However, problems may occur, including:  Harm to a pregnant woman and her unborn baby. This test involves the use  of radiation. Radiation exposure  can be dangerous to a pregnant woman and her unborn baby. If you are pregnant, you generally should not have this procedure done.  Slight increase in the risk of cancer. This is because of the radiation involved in the test. What happens before the procedure? No preparation is needed for this procedure. What happens during the procedure?  You will undress and remove any jewelry around your neck or chest.  You will put on a hospital gown.  Sticky electrodes will be placed on your chest. The electrodes will be connected to an electrocardiogram (ECG) machine to record a tracing of the electrical activity of your heart.  A CT scanner will take pictures of your heart. During this time, you will be asked to lie still and hold your breath for 2-3 seconds while a picture of your heart is being taken. The procedure may vary among health care providers and hospitals. What happens after the procedure?  You can get dressed.  You can return to your normal activities.  It is up to you to get the results of your test. Ask your health care provider, or the department that is doing the test, when your results will be ready. Summary  A coronary calcium scan is an imaging test used to look for deposits of calcium and other fatty materials (plaques) in the inner lining of the blood vessels of the heart (coronary arteries).  Generally, this is a safe procedure. Tell your health care provider if you are pregnant or may be pregnant.  No preparation is needed for this procedure.  A CT scanner will take pictures of your heart.  You can return to your normal activities after the scan is done. This information is not intended to replace advice given to you by your health care provider. Make sure you discuss any questions you have with your health care provider. Document Released: 12/28/2007 Document Revised: 05/20/2016 Document Reviewed: 05/20/2016 Elsevier Interactive  Patient Education  2017 [place]: At [facility], you and your health needs are our priority.  As part of our continuing mission to provide you with exceptional heart care, we have created designated Provider Care Teams.  These Care Teams include your primary Cardiologist (physician) and Advanced Practice Providers (APPs -  Physician Assistants and Nurse Practitioners) who all work together to provide you with the care you need, when you need it.  We recommend signing up for the patient portal called "MyChart".  Sign up information is provided on this After Visit Summary.  MyChart is used to connect with patients for Virtual Visits (Telemedicine).  Patients are able to view lab/test results, encounter notes, upcoming appointments, etc.  Non-urgent messages can be sent to your provider as well.   To learn more about what you can do with MyChart, go to [email or web address].    Your next appointment:   6 month(s)  The format for your next appointment:   In Person  Provider:   Oswaldo Milian, MD       Signed, Donato Heinz, MD  12/13/2020 10:42 AM    [place]

## 2020-12-13 NOTE — Patient Instructions (Signed)
Medication Instructions:  STOP propranolol START metoprolol succinate (Toprol XL) 25 mg once daily  *If you need a refill on your cardiac medications before your next appointment, please call your pharmacy*   Lab Work: BMET, Lipid today  If you have labs (blood work) drawn today and your tests are completely normal, you will receive your results only by: Marland Kitchen MyChart Message (if you have MyChart) OR . A paper copy in the mail If you have any lab test that is abnormal or we need to change your treatment, we will call you to review the results.   Testing/Procedures: CT coronary calcium score. This test is done at [address]. This is $99 out of pocket.   Coronary CalciumScan A coronary calcium scan is an imaging test used to look for deposits of calcium and other fatty materials (plaques) in the inner lining of the blood vessels of the heart (coronary arteries). These deposits of calcium and plaques can partly clog and narrow the coronary arteries without producing any symptoms or warning signs. This puts a person at risk for a heart attack. This test can detect these deposits before symptoms develop. Tell a health care provider about:  Any allergies you have.  All medicines you are taking, including vitamins, herbs, eye drops, creams, and over-the-counter medicines.  Any problems you or family members have had with anesthetic medicines.  Any blood disorders you have.  Any surgeries you have had.  Any medical conditions you have.  Whether you are pregnant or may be pregnant. What are the risks? Generally, this is a safe procedure. However, problems may occur, including:  Harm to a pregnant woman and her unborn baby. This test involves the use of radiation. Radiation exposure can be dangerous to a pregnant woman and her unborn baby. If you are pregnant, you generally should not have this procedure done.  Slight increase in the risk of cancer. This is because of  the radiation involved in the test. What happens before the procedure? No preparation is needed for this procedure. What happens during the procedure?  You will undress and remove any jewelry around your neck or chest.  You will put on a hospital gown.  Sticky electrodes will be placed on your chest. The electrodes will be connected to an electrocardiogram (ECG) machine to record a tracing of the electrical activity of your heart.  A CT scanner will take pictures of your heart. During this time, you will be asked to lie still and hold your breath for 2-3 seconds while a picture of your heart is being taken. The procedure may vary among health care providers and hospitals. What happens after the procedure?  You can get dressed.  You can return to your normal activities.  It is up to you to get the results of your test. Ask your health care provider, or the department that is doing the test, when your results will be ready. Summary  A coronary calcium scan is an imaging test used to look for deposits of calcium and other fatty materials (plaques) in the inner lining of the blood vessels of the heart (coronary arteries).  Generally, this is a safe procedure. Tell your health care provider if you are pregnant or may be pregnant.  No preparation is needed for this procedure.  A CT scanner will take pictures of your heart.  You can return to your normal activities after the scan is done. This information is not intended to replace advice  given to you by your health care provider. Make sure you discuss any questions you have with your health care provider. Document Released: 12/28/2007 Document Revised: 05/20/2016 Document Reviewed: 05/20/2016 Elsevier Interactive Patient Education  2017 [place]: At [facility], you and your health needs are our priority.  As part of our continuing mission to provide you with exceptional heart care, we have created designated  Provider Care Teams.  These Care Teams include your primary Cardiologist (physician) and Advanced Practice Providers (APPs -  Physician Assistants and Nurse Practitioners) who all work together to provide you with the care you need, when you need it.  We recommend signing up for the patient portal called "MyChart".  Sign up information is provided on this After Visit Summary.  MyChart is used to connect with patients for Virtual Visits (Telemedicine).  Patients are able to view lab/test results, encounter notes, upcoming appointments, etc.  Non-urgent messages can be sent to your provider as well.   To learn more about what you can do with MyChart, go to [email or web address].    Your next appointment:   6 month(s)  The format for your next appointment:   In Person  Provider:   Oswaldo Milian, MD

## 2020-12-25 DIAGNOSIS — I471 Supraventricular tachycardia: Secondary | ICD-10-CM | POA: Diagnosis not present

## 2020-12-25 DIAGNOSIS — N951 Menopausal and female climacteric states: Secondary | ICD-10-CM | POA: Diagnosis not present

## 2020-12-25 DIAGNOSIS — E7800 Pure hypercholesterolemia, unspecified: Secondary | ICD-10-CM | POA: Diagnosis not present

## 2020-12-25 DIAGNOSIS — F5101 Primary insomnia: Secondary | ICD-10-CM | POA: Diagnosis not present

## 2021-01-12 ENCOUNTER — Ambulatory Visit (INDEPENDENT_AMBULATORY_CARE_PROVIDER_SITE_OTHER)
Admission: RE | Admit: 2021-01-12 | Discharge: 2021-01-12 | Disposition: A | Discharge status: Home / Self Care | Payer: Self-pay | Source: Ambulatory Visit | Attending: Cardiology | Admitting: Cardiology

## 2021-01-12 ENCOUNTER — Other Ambulatory Visit: Payer: Self-pay

## 2021-01-12 DIAGNOSIS — R002 Palpitations: Secondary | ICD-10-CM

## 2021-01-23 ENCOUNTER — Other Ambulatory Visit: Payer: Self-pay | Admitting: *Deleted

## 2021-01-23 DIAGNOSIS — R918 Other nonspecific abnormal finding of lung field: Secondary | ICD-10-CM

## 2021-01-23 MED ORDER — ATORVASTATIN CALCIUM 20 MG PO TABS: 20.0000 mg | ORAL_TABLET | Freq: Every day | ORAL | 3 refills | Status: DC

## 2021-01-24 DIAGNOSIS — M25551 Pain in right hip: Secondary | ICD-10-CM | POA: Diagnosis not present

## 2021-01-26 ENCOUNTER — Telehealth: Payer: Self-pay | Admitting: Cardiology

## 2021-01-26 NOTE — Telephone Encounter (Addendum)
This RN called patient's husband (ok per DPR). Pt and husband were concerned about the lung nodules referenced in the CT result from 7/1.   Donato Heinz, MD  01/15/2021 11:33 AM EDT      Significant amount of calcified plaque in heart arteries (calcium score 187, 71st percentile for age/gender).  LDL 98 on 12/13/2020.  Recommend startingatorvastatin 20 mg daily.   Also with multiple small lung nodules, recommend noncontrast chest CT at 14months to follow   Pt's husband is concerned about waiting 6 months to reassess lung nodules, and reports she has had shortness of breath. Pt's husband reports the shortness of breath has been going on for several months and occurs with activity. Pt denies any swelling at this time. Pt's husband is requesting a sooner appointment to discuss results and address shortness of breath. This RN scheduled pt to see Dr. Gardiner Rhyme on 02/01/2021. This RN also told patient's husband if she experiences worsening shortness of breath, develops chest pain, or has other concerning symptoms in the mean time he should call 911 or take her to the emergency room. Pt's husband verbalized understanding.

## 2021-01-26 NOTE — Telephone Encounter (Signed)
Pt is calling in regards to the results from her most recent CT Scoring Test. Please advise pt further

## 2021-01-28 NOTE — Progress Notes (Deleted)
Cardiology Office Note:    Date:  01/28/2021   ID:  [name], DOB 1945-07-25, MRN [id number]  PCP:  Shirline Frees, MD  Cardiologist:  None  Electrophysiologist:  None   Referring MD: Shirline Frees, MD   No chief complaint on file.   History of Present Illness:    [name] is a 75 y.o. female with a hx of hyperlipidemia who presents for follow-up.  She was referred by Dr. Kenton Kingfisher for evaluation of chest pain and palpitations, initially seen on 09/11/2020.  She reports that she started having chest pain in January 2022.  States that it occurred for 3 to 4 days and then resolved.  Describes dull aching pain in center of her chest, would last for 2 to 3 hours.  Unclear what brings it on, and has not noted any relationship with exertion.  She denies any shortness of breath but reports she feels tired and weak.  Also reports intermittent lightheadedness but denies any syncope.  Reports she is having episodes few times per week where she feels like her heart is racing.  Reports lightheadedness during episodes.  She denies any lower extremity edema.  Reports BP has been labile, as low as 90s over 50s and as high as 140s over 80s.  She has been taking propranolol but feels very fatigued on the medication.  No smoking history.  Family history includes father had valve replacement in 33s.  Lexiscan Myoview on 09/21/2020 showed normal perfusion, baseline ST depressions in inferior leads that worsened during stress, EF 74%.  Echocardiogram on 10/03/2020 showed normal biventricular function, no significant valvular disease.  Zio patch x7 days on 10/03/2020 showed 72 episodes of SVT, longest lasting 26 seconds.  Calcium score on 01/12/2021 was 187 (71st percentile).  Also with multiple small pulmonary nodules.  Since last clinic visit,   she is doing well. She states that she experiences chest pain. She believes that the cause is heart burn from consuming chocolate. She also reports experiences  palpitations that she believes is due to over exerting herself. She denies any lightheadedness, syncope, or lower extremity edema.     Past Medical History:  Diagnosis Date   Allergy    Arthritis    oa   Cataract of both eyes    Cervical dysplasia    Diarrhea    off and on last 3 months 1 episode today   Dry eyes    Fluid retention    slight leg swelling at times   GERD (gastroesophageal reflux disease)    Legally blind in left eye, as defined in [place]    since birth    Low back pain    Lumbar herniated disc    Numbness and tingling    feet bilat    Osteopenia 06/2015    T score -1.7   Syncope    none recent   Urinary frequency    Urinary hesitancy    Wears glasses     Past Surgical History:  Procedure Laterality Date   COLONOSCOPY     LUMBAR LAMINECTOMY     2009 secondary to ruptured disc;    TOTAL HIP ARTHROPLASTY Left 03/25/2018   Procedure: LEFT TOTAL HIP ARTHROPLASTY ANTERIOR APPROACH;  Surgeon: Gaynelle Arabian, MD;  Location: [facility];  Service: Orthopedics;  Laterality: Left;   TOTAL KNEE ARTHROPLASTY Left 08/02/2015   Procedure: LEFT TOTAL KNEE ARTHROPLASTY;  Surgeon: Gaynelle Arabian, MD;  Location: [facility];  Service: Orthopedics;  Laterality: Left;  UPPER GASTROINTESTINAL ENDOSCOPY     VAGINAL HYSTERECTOMY  1980 or 1981   partial    Current Medications: No outpatient medications have been marked as taking for the 02/01/21 encounter (Appointment) with Donato Heinz, MD.   Current Facility-Administered Medications for the 02/01/21 encounter (Appointment) with Donato Heinz, MD  Medication   0.9 %  sodium chloride infusion     Allergies:   Patient has no known allergies.   Social History   Socioeconomic History   Marital status: Married    Spouse name: Not on file   Number of children: Not on file   Years of education: Not on file   Highest education level: Not on file  Occupational History   Not on file  Tobacco Use   Smoking status:  Never   Smokeless tobacco: Never  Vaping Use   Vaping Use: Never used  Substance and Sexual Activity   Alcohol use: No    Alcohol/week: 0.0 standard drinks   Drug use: No   Sexual activity: Yes    Birth control/protection: Surgical    Comment: Hyst-1st intercourse 89 yo-1 partner  Other Topics Concern   Not on file  Social History Narrative   Not on file   Social Determinants of Health   Financial Resource Strain: Not on file  Food Insecurity: Not on file  Transportation Needs: Not on file  Physical Activity: Not on file  Stress: Not on file  Social Connections: Not on file     Family History: The patient's family history includes Cancer in her father; Colon cancer in her father; Diabetes in her brother and father; Heart disease in her father; Hypertension in her brother. There is no history of Esophageal cancer, Rectal cancer, or Stomach cancer.  ROS:   Please see the history of present illness.    All other systems reviewed and are negative.  EKGs/Labs/Other Studies Reviewed:    The following studies were reviewed today:   EKG:  12/13/2020- No EKG ordered  09/11/2020- The ekg ordered demonstrates normal sinus rhythm, rate 71, low voltage, no ST abnormalities  Recent Labs: 12/13/2020: BUN 12; Creatinine, Ser 0.76; Potassium 4.5; Sodium 140  Recent Lipid Panel    Component Value Date/Time   CHOL 178 12/13/2020 1151   TRIG 135 12/13/2020 1151   HDL 56 12/13/2020 1151   CHOLHDL 3.2 12/13/2020 1151   CHOLHDL 3 09/15/2009 0954   VLDL 14.4 09/15/2009 0954   LDLCALC 98 12/13/2020 1151   LDLDIRECT 136.7 09/15/2009 0954    Physical Exam:    VS:  LMP 07/16/1979     Wt Readings from Last 3 Encounters:  12/13/20 139 lb 3.2 oz (63.1 kg)  09/21/20 139 lb (63 kg)  09/11/20 139 lb 3.2 oz (63.1 kg)     ZGY:FVCB nourished, well developed in no acute distress HEENT: Normal NECK: No JVD; No carotid bruits LYMPHATICS: No lymphadenopathy CARDIAC:RRR, no murmurs, rubs,  gallops RESPIRATORY:  Clear to auscultation without rales, wheezing or rhonchi  ABDOMEN: Soft, non-tender, non-distended MUSCULOSKELETAL:  No edema; No deformity  SKIN: Warm and dry NEUROLOGIC:  Alert and oriented x 3 PSYCHIATRIC:  Normal affect   ASSESSMENT:    No diagnosis found.  PLAN:    Chest pain: Atypical in description but does have CAD risk factors (age, hyperlipidemia).  Lexiscan Myoview on 09/21/2020 showed normal perfusion, baseline ST depressions in inferior leads that worsened during stress, EF 74%.  Echocardiogram on 10/03/2020 showed normal biventricular function, no significant valvular disease.   -  No further cardiac work-up recommended  Palpitations/SVT: Zio patch x7 days on 10/03/2020 showed 72 episodes of SVT, longest lasting 26 seconds.  She has been taking as needed propranolol, will stop propranolol and start Toprol-XL 25 mg daily  Hyperlipidemia: LDL 98 on 12/13/20.  Calcium score on 01/12/2021 was 187 (71st percentile).  Started atorvastatin 20 mg daily  Pulmonary nodules: Multiple small pulmonary nodules bilaterally on calcium score.  Largest measures 6 mm.  Repeat chest CT recommended at 6 months.  RTC in ***      Medication Adjustments/Labs and Tests Ordered: Current medicines are reviewed at length with the patient today.  Concerns regarding medicines are outlined above.  No orders of the defined types were placed in this encounter.  No orders of the defined types were placed in this encounter.   There are no Patient Instructions on file for this visit.     Signed, Donato Heinz, MD  01/28/2021 8:34 PM    [place]

## 2021-01-31 NOTE — Progress Notes (Signed)
Cardiology Office Note:    Date:  02/01/2021   ID:  [name], DOB July 16, 1945, MRN [id number]  PCP:  Shirline Frees, MD  Cardiologist:  None  Electrophysiologist:  None   Referring MD: Shirline Frees, MD   Chief Complaint  Patient presents with   Chest Pain     History of Present Illness:    [name] is a 75 y.o. female with a hx of hyperlipidemia who presents for follow-up.  She was referred by Dr. Kenton Kingfisher for evaluation of chest pain and palpitations, initially seen on 09/11/2020.  She reports that she started having chest pain in January 2022.  States that it occurred for 3 to 4 days and then resolved.  Describes dull aching pain in center of her chest, would last for 2 to 3 hours.  Unclear what brings it on, and has not noted any relationship with exertion.  She denies any shortness of breath but reports she feels tired and weak.  Also reports intermittent lightheadedness but denies any syncope.  Reports she is having episodes few times per week where she feels like her heart is racing.  Reports lightheadedness during episodes.  She denies any lower extremity edema.  Reports BP has been labile, as low as 90s over 50s and as high as 140s over 80s.  She has been taking propranolol but feels very fatigued on the medication.  No smoking history.  Family history includes father had valve replacement in 24s.  Lexiscan Myoview on 09/21/2020 showed normal perfusion, baseline ST depressions in inferior leads that worsened during stress, EF 74%.  Echocardiogram on 10/03/2020 showed normal biventricular function, no significant valvular disease.  Zio patch x7 days on 10/03/2020 showed 72 episodes of SVT, longest lasting 26 seconds.  Calcium score on 01/12/2021 was 187 (71st percentile).  Also with multiple small pulmonary nodules.  Since last clinic visit, she is doing OK.  She is accompanied by her husband. She notices her heart rate was 49 the other day. She experiences lightheadedness,  occasional shortness of breath and chest pains that's located on top of her stomach and travels further up. Denies LE edema or syncope however had a presyncopal episode when walking up the stairs after taking her dog outside. She is not currently exercising but is very active.  Past Medical History:  Diagnosis Date   Allergy    Arthritis    oa   Cataract of both eyes    Cervical dysplasia    Diarrhea    off and on last 3 months 1 episode today   Dry eyes    Fluid retention    slight leg swelling at times   GERD (gastroesophageal reflux disease)    Legally blind in left eye, as defined in [place]    since birth    Low back pain    Lumbar herniated disc    Numbness and tingling    feet bilat    Osteopenia 06/2015    T score -1.7   Syncope    none recent   Urinary frequency    Urinary hesitancy    Wears glasses     Past Surgical History:  Procedure Laterality Date   COLONOSCOPY     LUMBAR LAMINECTOMY     2009 secondary to ruptured disc;    TOTAL HIP ARTHROPLASTY Left 03/25/2018   Procedure: LEFT TOTAL HIP ARTHROPLASTY ANTERIOR APPROACH;  Surgeon: Gaynelle Arabian, MD;  Location: [facility];  Service: Orthopedics;  Laterality: Left;   TOTAL  KNEE ARTHROPLASTY Left 08/02/2015   Procedure: LEFT TOTAL KNEE ARTHROPLASTY;  Surgeon: Gaynelle Arabian, MD;  Location: [facility];  Service: Orthopedics;  Laterality: Left;   UPPER GASTROINTESTINAL ENDOSCOPY     VAGINAL HYSTERECTOMY  1980 or 1981   partial    Current Medications: No outpatient medications have been marked as taking for the 02/01/21 encounter (Office Visit) with Donato Heinz, MD.   Current Facility-Administered Medications for the 02/01/21 encounter (Office Visit) with Donato Heinz, MD  Medication   0.9 %  sodium chloride infusion     Allergies:   Patient has no known allergies.   Social History   Socioeconomic History   Marital status: Married    Spouse name: Not on file   Number of children: Not on file    Years of education: Not on file   Highest education level: Not on file  Occupational History   Not on file  Tobacco Use   Smoking status: Never   Smokeless tobacco: Never  Vaping Use   Vaping Use: Never used  Substance and Sexual Activity   Alcohol use: No    Alcohol/week: 0.0 standard drinks   Drug use: No   Sexual activity: Yes    Birth control/protection: Surgical    Comment: Hyst-1st intercourse 52 yo-1 partner  Other Topics Concern   Not on file  Social History Narrative   Not on file   Social Determinants of Health   Financial Resource Strain: Not on file  Food Insecurity: Not on file  Transportation Needs: Not on file  Physical Activity: Not on file  Stress: Not on file  Social Connections: Not on file     Family History: The patient's family history includes Cancer in her father; Colon cancer in her father; Diabetes in her brother and father; Heart disease in her father; Hypertension in her brother. There is no history of Esophageal cancer, Rectal cancer, or Stomach cancer.  ROS:   Please see the history of present illness.    (+) chest pains  (+) lightheadedness All other systems reviewed and are negative.  EKGs/Labs/Other Studies Reviewed:    The following studies were reviewed today:  Echo 03/22:    1. Left ventricular ejection fraction, by estimation, is 60 to 65%. The  left ventricle has normal function. The left ventricle has no regional  wall motion abnormalities. Left ventricular diastolic parameters are  indeterminate.   2. Right ventricular systolic function is normal. The right ventricular  size is normal. There is normal pulmonary artery systolic pressure.   3. The mitral valve is normal in structure. No evidence of mitral valve  regurgitation. No evidence of mitral stenosis.   4. The aortic valve is normal in structure. Aortic valve regurgitation is  not visualized. No aortic stenosis is present.   Long term monitor 03/22:  Study  Highlights   72 episodes of SVT, longest lasting 26 seconds with rate 112 bpm     Patch Wear Time:  7 days and 2 hours (2022-03-11T11:48:10-498 to 2022-03-18T14:57:26-0400)   Patient had a min HR of 53 bpm, max HR of 144 bpm, and avg HR of 69 bpm. Predominant underlying rhythm was Sinus Rhythm. 72 Supraventricular Tachycardia runs occurred, the run with the fastest interval lasting 18 beats with a max rate of 144 bpm, the longest lasting 26.3 secs with an avg rate of 112 bpm. Isolated SVEs were rare (<1.0%), SVE Couplets were rare (<1.0%), and SVE Triplets were rare (<1.0%). Isolated VEs were rare (<  1.0%), and no VE Couplets or VE Triplets were present. 2 patient triggered events, corresponding to sinus rhythm +/- PACs  Lexiscan 09/21/20  Study Highlights ST depressions in inferior leads at rest, worsened during stress, along with <36mm horizontal ST depressions in V5/6 The left ventricular ejection fraction is hyperdynamic (>65%). Nuclear stress EF: 74%. The study is normal. This is a low risk study.   EKG:  02/01/21: sinus rhythm, rate 54, low voltage, no st abnormalities 12/13/2020- No EKG ordered  09/11/2020- The ekg ordered demonstrates normal sinus rhythm, rate 71, low voltage, no ST abnormalities  Recent Labs: 12/13/2020: BUN 12; Creatinine, Ser 0.76; Potassium 4.5; Sodium 140  Recent Lipid Panel    Component Value Date/Time   CHOL 178 12/13/2020 1151   TRIG 135 12/13/2020 1151   HDL 56 12/13/2020 1151   CHOLHDL 3.2 12/13/2020 1151   CHOLHDL 3 09/15/2009 0954   VLDL 14.4 09/15/2009 0954   LDLCALC 98 12/13/2020 1151   LDLDIRECT 136.7 09/15/2009 0954    Physical Exam:    VS:  BP 130/70 (BP Location: Right Arm)   Pulse (!) 54   Ht 5\' 4"  (1.626 m)   Wt 135 lb (61.2 kg)   LMP 07/16/1979   BMI 23.17 kg/m     Wt Readings from Last 3 Encounters:  02/01/21 135 lb (61.2 kg)  12/13/20 139 lb 3.2 oz (63.1 kg)  09/21/20 139 lb (63 kg)     RSW:NIOE nourished, well  developed in no acute distress HEENT: Normal NECK: No JVD; No carotid bruits LYMPHATICS: No lymphadenopathy CARDIAC:RRR, no murmurs, rubs, gallops RESPIRATORY:  Clear to auscultation without rales, wheezing or rhonchi  ABDOMEN: Soft, non-tender, non-distended MUSCULOSKELETAL:  No edema; No deformity  SKIN: Warm and dry NEUROLOGIC:  Alert and oriented x 3 PSYCHIATRIC:  Normal affect   ASSESSMENT:    1. SVT (supraventricular tachycardia) (HCC)   2. Chest pain of uncertain etiology   3. Palpitations   4. Lung nodule, multiple   5. Hyperlipidemia, unspecified hyperlipidemia type     PLAN:    Chest pain: Atypical in description but does have CAD risk factors (age, hyperlipidemia).  Lexiscan Myoview on 09/21/2020 showed normal perfusion, baseline ST depressions in inferior leads that worsened during stress, EF 74%.  Echocardiogram on 10/03/2020 showed normal biventricular function, no significant valvular disease.   -No further cardiac work-up recommended  Palpitations/SVT: Zio patch x7 days on 10/03/2020 showed 72 episodes of SVT, longest lasting 26 seconds.  She has been taking as needed propranolol, stopped propranolol and started Toprol-XL 25 mg daily.  Heart rate 54 in clinic today and she reports has been as low as high 40s at home and does report intermittent lightheadedness, will reduce dose to 12.5 mg daily  Hyperlipidemia: LDL 98 on 12/13/20.  Calcium score on 01/12/2021 was 187 (71st percentile).  Started atorvastatin 20 mg daily  Pulmonary nodules: Multiple small pulmonary nodules bilaterally on calcium score.  Largest measures 6 mm.  Repeat chest CT recommended at 6 months.  RTC in 6 months    Medication Adjustments/Labs and Tests Ordered: Current medicines are reviewed at length with the patient today.  Concerns regarding medicines are outlined above.  Orders Placed This Encounter  Procedures   EKG 12-Lead    Meds ordered this encounter  Medications   metoprolol  succinate (TOPROL XL) 25 MG 24 hr tablet    Sig: Take 0.5 tablets (12.5 mg total) by mouth daily.    Dispense:  45 tablet  Refill:  3    Dose change     Patient Instructions  Medication Instructions:  DECREASE metoprolol succinate (Toprol XL) to 12.5 mg daily Take Protonix daily   *If you need a refill on your cardiac medications before your next appointment, please call your pharmacy*  Testing/Procedures: CT chest due in January-we will call closer to time to schedule this.  Follow-Up: At [facility], you and your health needs are our priority.  As part of our continuing mission to provide you with exceptional heart care, we have created designated Provider Care Teams.  These Care Teams include your primary Cardiologist (physician) and Advanced Practice Providers (APPs -  Physician Assistants and Nurse Practitioners) who all work together to provide you with the care you need, when you need it.  We recommend signing up for the patient portal called "MyChart".  Sign up information is provided on this After Visit Summary.  MyChart is used to connect with patients for Virtual Visits (Telemedicine).  Patients are able to view lab/test results, encounter notes, upcoming appointments, etc.  Non-urgent messages can be sent to your provider as well.   To learn more about what you can do with MyChart, go to [email or web address].    Your next appointment:   As scheduled with Dr. Seward Grater Bradford,acting as a scribe for Donato Heinz, MD.,have documented all relevant documentation on the behalf of Donato Heinz, MD,as directed by  Donato Heinz, MD while in the presence of Donato Heinz, MD.  I, Donato Heinz, MD, have reviewed all documentation for this visit. The documentation on 02/01/21 for the exam, diagnosis, procedures, and orders are all accurate and complete.    Signed, Donato Heinz, MD  02/01/2021 10:08 AM     [place]

## 2021-02-01 ENCOUNTER — Other Ambulatory Visit: Payer: Self-pay

## 2021-02-01 ENCOUNTER — Encounter: Payer: Self-pay | Admitting: Cardiology

## 2021-02-01 ENCOUNTER — Ambulatory Visit: Payer: Medicare Other | Admitting: Cardiology

## 2021-02-01 VITALS — BP 130/70 | HR 54 | Ht 64.0 in | Wt 135.0 lb

## 2021-02-01 DIAGNOSIS — R002 Palpitations: Secondary | ICD-10-CM | POA: Diagnosis not present

## 2021-02-01 DIAGNOSIS — I471 Supraventricular tachycardia, unspecified: Secondary | ICD-10-CM

## 2021-02-01 DIAGNOSIS — E785 Hyperlipidemia, unspecified: Secondary | ICD-10-CM | POA: Diagnosis not present

## 2021-02-01 DIAGNOSIS — R079 Chest pain, unspecified: Secondary | ICD-10-CM

## 2021-02-01 DIAGNOSIS — R918 Other nonspecific abnormal finding of lung field: Secondary | ICD-10-CM | POA: Diagnosis not present

## 2021-02-01 MED ORDER — METOPROLOL SUCCINATE ER 25 MG PO TB24: 12.5000 mg | ORAL_TABLET | Freq: Every day | ORAL | 3 refills | Status: DC

## 2021-02-01 NOTE — Patient Instructions (Signed)
Medication Instructions:  DECREASE metoprolol succinate (Toprol XL) to 12.5 mg daily Take Protonix daily   *If you need a refill on your cardiac medications before your next appointment, please call your pharmacy*  Testing/Procedures: CT chest due in January-we will call closer to time to schedule this.  Follow-Up: At [facility], you and your health needs are our priority.  As part of our continuing mission to provide you with exceptional heart care, we have created designated Provider Care Teams.  These Care Teams include your primary Cardiologist (physician) and Advanced Practice Providers (APPs -  Physician Assistants and Nurse Practitioners) who all work together to provide you with the care you need, when you need it.  We recommend signing up for the patient portal called "MyChart".  Sign up information is provided on this After Visit Summary.  MyChart is used to connect with patients for Virtual Visits (Telemedicine).  Patients are able to view lab/test results, encounter notes, upcoming appointments, etc.  Non-urgent messages can be sent to your provider as well.   To learn more about what you can do with MyChart, go to [email or web address].    Your next appointment:   As scheduled with Dr. Gardiner Rhyme

## 2021-03-02 DIAGNOSIS — M25551 Pain in right hip: Secondary | ICD-10-CM | POA: Diagnosis not present

## 2021-03-07 ENCOUNTER — Telehealth: Payer: Self-pay

## 2021-03-07 NOTE — Telephone Encounter (Signed)
   [place] Pre-operative Risk Assessment    Patient Name: [name]  DOB: 1945-10-13 MRN: [id number]  HEARTCARE STAFF:  - IMPORTANT!!!!!! Under Visit Info/Reason for Call, type in Other and utilize the format Clearance MM/DD/YY or Clearance TBD. Do not use dashes or single digits. - Please review there is not already an duplicate clearance open for this procedure. - If request is for dental extraction, please clarify the # of teeth to be extracted. - If the patient is currently at the dentist's office, call Pre-Op Callback Staff (MA/nurse) to input urgent request.  - If the patient is not currently in the dentist office, please route to the Pre-Op pool.  Request for surgical clearance:  What type of surgery is being performed? Right Total Hip Arthroplasty Anterior   When is this surgery scheduled? 05/07/21  What type of clearance is required (medical clearance vs. Pharmacy clearance to hold med vs. Both)? Medical  Are there any medications that need to be held prior to surgery and how long? Aspirin  Practice name and name of physician performing surgery? [facility]  Dr. Pilar Plate Aluisio  What is the office phone number? [phone]   7.   What is the office fax number? [phone]  8.   Anesthesia type (None, local, MAC, general) ? Unknown   Zebedee Iba 03/07/2021, 4:43 PM  _________________________________________________________________   (provider comments below)

## 2021-03-08 NOTE — Telephone Encounter (Signed)
    Patient Name: [name]  DOB: Nov 27, 1945 MRN: [id number]  Primary Cardiologist: Dr. Gardiner Rhyme, MD   Chart reviewed as part of pre-operative protocol coverage. Given past medical history and time since last visit, based on ACC/AHA guidelines, [name] would be at acceptable risk for the planned procedure without further cardiovascular testing.   She was recently seen in follow up 02/01/21 with Dr. Gardiner Rhyme. She previously had episodes of atypical chest pain and underwent a Lexiscan Myoview on 09/21/2020 that showed normal perfusion, baseline ST depressions in inferior leads that worsened during stress, EF 74%.  Echocardiogram on 10/03/2020 showed normal biventricular function, no significant valvular disease. Zio patch x7 days on 10/03/2020 showed 72 episodes of SVT, longest lasting 26 seconds. Chest pain felt to be secondary to SVT. Calcium score on 01/12/2021 was 187 (71st percentile).  Also with multiple small pulmonary nodules. She had previously been taking PRN propanolol however this was changed to Toprol given SVT.   She is active at baseline and can perform greater than 4METS without CV issues. She has no hx of CAD or stent placement therefore she may hold ASA for 3-5 days prior to procedure and resume once ok from procedural team from a bleeding standpoint.    The patient was advised that if she develops new symptoms prior to surgery to contact our office to arrange for a follow-up visit, and she verbalized understanding.  I will route this recommendation to the requesting party via Epic fax function and remove from pre-op pool.  Please call with questions.  Kathyrn Drown, NP 03/08/2021, 7:39 AM

## 2021-04-10 DIAGNOSIS — F5101 Primary insomnia: Secondary | ICD-10-CM | POA: Diagnosis not present

## 2021-04-10 DIAGNOSIS — M1612 Unilateral primary osteoarthritis, left hip: Secondary | ICD-10-CM | POA: Diagnosis not present

## 2021-04-10 DIAGNOSIS — E7800 Pure hypercholesterolemia, unspecified: Secondary | ICD-10-CM | POA: Diagnosis not present

## 2021-04-10 DIAGNOSIS — Z0000 Encounter for general adult medical examination without abnormal findings: Secondary | ICD-10-CM | POA: Diagnosis not present

## 2021-04-17 NOTE — H&P (Signed)
TOTAL HIP ADMISSION H&P  Patient is admitted for right total hip arthroplasty.  Subjective:  Chief Complaint: Right hip pain  HPI: [name], 75 y.o. female, has a history of pain and functional disability in the right hip due to arthritis and patient has failed non-surgical conservative treatments for greater than 12 weeks to include corticosteriod injections and activity modification. Onset of symptoms was gradual, starting >10 years ago with gradually worsening course since that time. The patient noted no past surgery on the right hip. Patient currently rates pain in the right hip at 8 out of 10 with activity. Patient has night pain, worsening of pain with activity and weight bearing, pain that interfers with activities of daily living, and pain with passive range of motion. Patient has evidence of  bone-on-bone arthritis  by imaging studies. This condition presents safety issues increasing the risk of falls. There is no current active infection.  Patient Active Problem List   Diagnosis Date Noted   OA (osteoarthritis) of hip 03/25/2018   OA (osteoarthritis) of knee 08/02/2015   Fluid retention    GASTROENTERITIS, ACUTE 09/19/2009   DEGENERATIVE JOINT DISEASE, LUMBAR SPINE 06/07/2009   ECZEMA 09/28/2008   HERNIATED LUMBOSACRAL DISC 04/13/2008   SCIATICA, ACUTE 04/13/2008   LOW BACK PAIN 04/04/2008   BACK PAIN 04/04/2008   IBS 09/09/2007   ARTHRITIS 09/09/2007   EDEMA 09/09/2007   HYPERTENSION, LABILE, HX OF 04/02/2007   ALLERGIC RHINITIS 03/30/2007    Past Medical History:  Diagnosis Date   Allergy    Arthritis    oa   Cataract of both eyes    Cervical dysplasia    Diarrhea    off and on last 3 months 1 episode today   Dry eyes    Fluid retention    slight leg swelling at times   GERD (gastroesophageal reflux disease)    Legally blind in left eye, as defined in [place]    since birth    Low back pain    Lumbar herniated disc    Numbness and tingling    feet bilat     Osteopenia 06/2015    T score -1.7   Syncope    none recent   Urinary frequency    Urinary hesitancy    Wears glasses     Past Surgical History:  Procedure Laterality Date   COLONOSCOPY     LUMBAR LAMINECTOMY     2009 secondary to ruptured disc;    TOTAL HIP ARTHROPLASTY Left 03/25/2018   Procedure: LEFT TOTAL HIP ARTHROPLASTY ANTERIOR APPROACH;  Surgeon: Gaynelle Arabian, MD;  Location: [facility];  Service: Orthopedics;  Laterality: Left;   TOTAL KNEE ARTHROPLASTY Left 08/02/2015   Procedure: LEFT TOTAL KNEE ARTHROPLASTY;  Surgeon: Gaynelle Arabian, MD;  Location: [facility];  Service: Orthopedics;  Laterality: Left;   UPPER GASTROINTESTINAL ENDOSCOPY     VAGINAL HYSTERECTOMY  1980 or 1981   partial    Prior to Admission medications   Medication Sig Start Date End Date Taking? Authorizing Provider  ALPRAZolam (XANAX) 0.25 MG tablet Take 0.25 mg by mouth 2 (two) times daily as needed for anxiety.     [provider]  aspirin EC 81 MG tablet Take 81 mg by mouth daily.    [provider]  atorvastatin (LIPITOR) 20 MG tablet Take 1 tablet (20 mg total) by mouth daily. 01/23/21 04/23/21  Donato Heinz, MD  Calcium Carb-Cholecalciferol (CALCIUM 600+D3 PO) Take 1 tablet by mouth 2 (two) times  daily.    [provider]  cholecalciferol (VITAMIN D) 1000 units tablet Take 1,000 Units by mouth every evening.    [provider]  estradiol (ESTRACE) 0.5 MG tablet Take by mouth. 05/18/19   [provider]  metoprolol succinate (TOPROL XL) 25 MG 24 hr tablet Take 0.5 tablets (12.5 mg total) by mouth daily. 02/01/21   Donato Heinz, MD  Multiple Vitamin (MULTIVITAMIN WITH MINERALS) TABS tablet Take 1 tablet by mouth daily. Senior Multivitamin    [provider]  Omega-3 Fatty Acids (FISH OIL) 1000 MG CAPS Take 1,000 mg by mouth 2 (two) times daily.    [provider]  pantoprazole (PROTONIX) 40 MG tablet Take 40 mg by mouth as  needed.  05/18/19   [provider]  potassium chloride SA (K-DUR,KLOR-CON) 20 MEQ tablet Take 20 mEq by mouth every Monday, Wednesday, and Friday.    [provider]  Red Yeast Rice 600 MG CAPS Take 600 mg by mouth 2 (two) times daily. Patient not taking: Reported on 02/01/2021    [provider]  temazepam (RESTORIL) 15 MG capsule Take 15 mg by mouth at bedtime.    [provider]  vitamin C (ASCORBIC ACID) 500 MG tablet Take 500 mg by mouth daily.    [provider]  zinc gluconate 50 MG tablet Take 50 mg by mouth daily.    [provider]    No Known Allergies  Social History   Socioeconomic History   Marital status: Married    Spouse name: Not on file   Number of children: Not on file   Years of education: Not on file   Highest education level: Not on file  Occupational History   Not on file  Tobacco Use   Smoking status: Never   Smokeless tobacco: Never  Vaping Use   Vaping Use: Never used  Substance and Sexual Activity   Alcohol use: No    Alcohol/week: 0.0 standard drinks   Drug use: No   Sexual activity: Yes    Birth control/protection: Surgical    Comment: Hyst-1st intercourse 15 yo-1 partner  Other Topics Concern   Not on file  Social History Narrative   Not on file   Social Determinants of Health   Financial Resource Strain: Not on file  Food Insecurity: Not on file  Transportation Needs: Not on file  Physical Activity: Not on file  Stress: Not on file  Social Connections: Not on file  Intimate Partner Violence: Not on file    Tobacco Use: Low Risk    Smoking Tobacco Use: Never   Smokeless Tobacco Use: Never   Social History   Substance and Sexual Activity  Alcohol Use No   Alcohol/week: 0.0 standard drinks    Family History  Problem Relation Age of Onset   Heart disease Father    Cancer Father        colon   Diabetes Father        borderline   Colon cancer Father    Diabetes Brother     Hypertension Brother    Esophageal cancer Neg Hx    Rectal cancer Neg Hx    Stomach cancer Neg Hx     Review of Systems  Constitutional:  Negative for chills and fever.  HENT:  Negative for congestion, sore throat and tinnitus.   Eyes:  Negative for double vision, photophobia and pain.  Respiratory:  Negative for cough, shortness of breath and wheezing.  Cardiovascular:  Negative for chest pain, palpitations and orthopnea.  Gastrointestinal:  Negative for heartburn, nausea and vomiting.  Genitourinary:  Negative for dysuria, frequency and urgency.  Musculoskeletal:  Positive for joint pain.  Neurological:  Negative for dizziness, weakness and headaches.    Objective:  Physical Exam: Well nourished and well developed.  General: Alert and oriented x3, cooperative and pleasant, no acute distress.  Head: normocephalic, atraumatic, neck supple.  Eyes: EOMI.  Respiratory: breath sounds clear in all fields, no wheezing, rales, or rhonchi. Cardiovascular: Regular rate and rhythm, no murmurs, gallops or rubs.  Abdomen: non-tender to palpation and soft, normoactive bowel sounds. Musculoskeletal:  Right Hip Exam:   The range of motion: Flexion to 110 degrees, Internal Rotation to 20 degrees, External Rotation to 30 degrees, and abduction to 30 degrees with discomfort, especially rotation.   There is no tenderness over the greater trochanteric bursa.   Calves soft and nontender. Motor function intact in LE. Strength 5/5 LE bilaterally. Neuro: Distal pulses 2+. Sensation to light touch intact in LE.   Imaging Review Plain radiographs demonstrate severe degenerative joint disease of the right hip. The bone quality appears to be adequate for age and reported activity level.  Assessment/Plan:  End stage arthritis, right hip  The patient history, physical examination, clinical judgement of the provider and imaging studies are consistent with end stage degenerative joint disease of the  right hip and total hip arthroplasty is deemed medically necessary. The treatment options including medical management, injection therapy, arthroscopy and arthroplasty were discussed at length. The risks and benefits of total hip arthroplasty were presented and reviewed. The risks due to aseptic loosening, infection, stiffness, dislocation/subluxation, thromboembolic complications and other imponderables were discussed. The patient acknowledged the explanation, agreed to proceed with the plan and consent was signed. Patient is being admitted for inpatient treatment for surgery, pain control, PT, OT, prophylactic antibiotics, VTE prophylaxis, progressive ambulation and ADLs and discharge planning.The patient is planning to be discharged  home .   Patient's anticipated LOS is less than 2 midnights, meeting these requirements: - Lives within 1 hour of care - Has a competent adult at home to recover with post-op recover - NO history of  - Chronic pain requiring opiods  - Diabetes  - Coronary Artery Disease  - Heart failure  - Heart attack  - Stroke  - DVT/VTE  - Cardiac arrhythmia  - Respiratory Failure/COPD  - Renal failure  - Anemia  - Advanced Liver disease  Therapy Plans: HEP Disposition: Home with husband Planned DVT Prophylaxis: Aspirin 325 mg BID DME Needed: None PCP: Shirline Frees, MD (clearance received) Cardiologist: Oswaldo Milian, MD (clearance received) TXA: IV Allergies: NKDA Anesthesia Concerns: None BMI: 23.9 Last HgbA1c: Not diabetic Pharmacy: [facility] ([address])  Other: - Oxycodone with previous right THA  - Patient was instructed on what medications to stop prior to surgery. - Follow-up visit in 2 weeks with Dr. Wynelle Link - Begin physical therapy following surgery - Pre-operative lab work as pre-surgical testing - Prescriptions will be provided in hospital at time of discharge  Theresa Duty, PA-C Orthopedic Surgery [facility] [place]  [facility]

## 2021-04-23 NOTE — Progress Notes (Addendum)
COVID swab appointment: 05/03/21  COVID Vaccine Completed: no Date COVID Vaccine completed: Has received booster: COVID vaccine manufacturer: [employer]   Date of COVID positive in last 90 days: yes home test 03/15/21, no doctor visit  PCP - Shirline Frees, MD Cardiologist - Dr Gardiner Rhyme  Cardiac clearance by Kathyrn Drown 03/08/21 on chart Medical Clearance Shirline Frees 03/06/21 on chart  Chest x-ray - CT 01/12/21 Epic EKG - 02/01/21 Epic Stress Test - 09/21/20 Epic ECHO - 10/03/20 Epic Cardiac Cath - n/a Pacemaker/ICD device last checked: n/a Spinal Cord Stimulator: n/a  Sleep Study - n/a CPAP -   Fasting Blood Sugar - n/a Checks Blood Sugar _____ times a day  Blood Thinner Instructions: Aspirin Instructions: ASA 81, hold 5 days Last Dose:  Activity level: Can go up a flight of stairs and perform activities of daily living without stopping and without symptoms of chest pain or shortness of breath.      Anesthesia review:   Patient denies shortness of breath, fever, cough and chest pain at PAT appointment   Patient verbalized understanding of instructions that were given to them at the PAT appointment. Patient was also instructed that they will need to review over the PAT instructions again at home before surgery.

## 2021-04-23 NOTE — Patient Instructions (Addendum)
DUE TO COVID-19 ONLY ONE VISITOR IS ALLOWED TO COME WITH YOU AND STAY IN THE WAITING ROOM ONLY DURING PRE OP AND PROCEDURE.   **NO VISITORS ARE ALLOWED IN THE SHORT STAY AREA OR RECOVERY ROOM!!**  IF YOU WILL BE ADMITTED INTO THE HOSPITAL YOU ARE ALLOWED ONLY TWO SUPPORT PEOPLE DURING VISITATION HOURS ONLY (10AM -8PM)   The support person(s) may change daily. The support person(s) must pass our screening, gel in and out, and wear a mask at all times, including in the patient's room. Patients must also wear a mask when staff or their support person are in the room.  No visitors under the age of 53. Any visitor under the age of 44 must be accompanied by an adult.    COVID SWAB TESTING MUST BE COMPLETED ON:  05/03/21 **MUST PRESENT COMPLETED FORM AT TESTING SITE**    [facility] [place] (backside of the building) No appointment needed. Open 8am-3pm. You are not required to quarantine, however you are required to wear a well-fitted mask when you are out and around people not in your household.  Hand Hygiene often Do NOT share personal items Notify your provider if you are in close contact with someone who has COVID or you develop fever 100.4 or greater, new onset of sneezing, cough, sore throat, shortness of breath or body aches.       Your procedure is scheduled on: 05/07/21   Report to [facility] Main Entrance    Report to admitting at  11:00 AM   Call this number if you have problems the morning of surgery [phone]   Do not eat food :After Midnight.   May have liquids until 10:45 AM day of surgery  CLEAR LIQUID DIET  Foods Allowed                                                                     Foods Excluded  Water, Black Coffee and tea (no milk or creamer)           liquids that you cannot  Plain Jell-O in any flavor  (No red)                                    see through such as: Fruit ices (not with fruit pulp)                                             milk, soups, orange juice              Iced Popsicles (No red)                                                All solid food  Apple juices Sports drinks like Gatorade (No red) Lightly seasoned clear broth or consume(fat free) Sugar      The day of surgery:  Drink ONE (1) Pre-Surgery Clear Ensure by 10:45 am the morning of surgery. Drink in one sitting. Do not sip.  This drink was given to you during your hospital  pre-op appointment visit. Nothing else to drink after completing the  Pre-Surgery Clear Ensure.          If you have questions, please contact your surgeon's office.     Oral Hygiene is also important to reduce your risk of infection.                                    Remember - BRUSH YOUR TEETH THE MORNING OF SURGERY WITH YOUR REGULAR TOOTHPASTE   Take these medicines the morning of surgery with A SIP OF WATER: Metoprolol, Pantoprazole                              You may not have any metal on your body including hair pins, jewelry, and body piercing             Do not wear make-up, lotions, powders, perfumes, or deodorant  Do not wear nail polish including gel and S&S, artificial/acrylic nails, or any other type of covering on natural nails including finger and toenails. If you have artificial nails, gel coating, etc. that needs to be removed by a nail salon please have this removed prior to surgery or surgery may need to be canceled/ delayed if the surgeon/ anesthesia feels like they are unable to be safely monitored.   Do not shave  48 hours prior to surgery.    Do not bring valuables to the hospital. [place].   Bring small overnight bag day of surgery.  Special Instructions: Bring a copy of your healthcare power of attorney and living will documents         the day of surgery if you haven't scanned them before.   Please read over the following fact sheets you were  given: IF YOU HAVE QUESTIONS ABOUT YOUR PRE-OP INSTRUCTIONS PLEASE CALL [place] - Preparing for Surgery Before surgery, you can play an important role.  Because skin is not sterile, your skin needs to be as free of germs as possible.  You can reduce the number of germs on your skin by washing with CHG (chlorahexidine gluconate) soap before surgery.  CHG is an antiseptic cleaner which kills germs and bonds with the skin to continue killing germs even after washing. Please DO NOT use if you have an allergy to CHG or antibacterial soaps.  If your skin becomes reddened/irritated stop using the CHG and inform your nurse when you arrive at Short Stay. Do not shave (including legs and underarms) for at least 48 hours prior to the first CHG shower.  You may shave your face/neck.  Please follow these instructions carefully:  1.  Shower with CHG Soap the night before surgery and the  morning of surgery.  2.  If you choose to wash your hair, wash your hair first as usual with your normal  shampoo.  3.  After you shampoo, rinse your hair and  body thoroughly to remove the shampoo.                             4.  Use CHG as you would any other liquid soap.  You can apply chg directly to the skin and wash.  Gently with a scrungie or clean washcloth.  5.  Apply the CHG Soap to your body ONLY FROM THE NECK DOWN.   Do   not use on face/ open                           Wound or open sores. Avoid contact with eyes, ears mouth and   genitals (private parts).                       Wash face,  Genitals (private parts) with your normal soap.             6.  Wash thoroughly, paying special attention to the area where your    surgery  will be performed.  7.  Thoroughly rinse your body with warm water from the neck down.  8.  DO NOT shower/wash with your normal soap after using and rinsing off the CHG Soap.                9.  Pat yourself dry with a clean towel.            10.  Wear clean  pajamas.            11.  Place clean sheets on your bed the night of your first shower and do not  sleep with pets. Day of Surgery : Do not apply any lotions/deodorants the morning of surgery.  Please wear clean clothes to the hospital/surgery center.  FAILURE TO FOLLOW THESE INSTRUCTIONS MAY RESULT IN THE CANCELLATION OF YOUR SURGERY  PATIENT SIGNATURE_________________________________  NURSE SIGNATURE__________________________________  ________________________________________________________________________   Adam Phenix  An incentive spirometer is a tool that can help keep your lungs clear and active. This tool measures how well you are filling your lungs with each breath. Taking long deep breaths may help reverse or decrease the chance of developing breathing (pulmonary) problems (especially infection) following: A long period of time when you are unable to move or be active. BEFORE THE PROCEDURE  If the spirometer includes an indicator to show your best effort, your nurse or respiratory therapist will set it to a desired goal. If possible, sit up straight or lean slightly forward. Try not to slouch. Hold the incentive spirometer in an upright position. INSTRUCTIONS FOR USE  Sit on the edge of your bed if possible, or sit up as far as you can in bed or on a chair. Hold the incentive spirometer in an upright position. Breathe out normally. Place the mouthpiece in your mouth and seal your lips tightly around it. Breathe in slowly and as deeply as possible, raising the piston or the ball toward the top of the column. Hold your breath for 3-5 seconds or for as long as possible. Allow the piston or ball to fall to the bottom of the column. Remove the mouthpiece from your mouth and breathe out normally. Rest for a few seconds and repeat Steps 1 through 7 at least 10 times every 1-2 hours when you are awake. Take your time and take a few normal breaths between deep breaths. The  spirometer may include an indicator to show your best effort. Use the indicator as a goal to work toward during each repetition. After each set of 10 deep breaths, practice coughing to be sure your lungs are clear. If you have an incision (the cut made at the time of surgery), support your incision when coughing by placing a pillow or rolled up towels firmly against it. Once you are able to get out of bed, walk around indoors and cough well. You may stop using the incentive spirometer when instructed by your caregiver.  RISKS AND COMPLICATIONS Take your time so you do not get dizzy or light-headed. If you are in pain, you may need to take or ask for pain medication before doing incentive spirometry. It is harder to take a deep breath if you are having pain. AFTER USE Rest and breathe slowly and easily. It can be helpful to keep track of a log of your progress. Your caregiver can provide you with a simple table to help with this. If you are using the spirometer at home, follow these instructions: [place] IF:  You are having difficultly using the spirometer. You have trouble using the spirometer as often as instructed. Your pain medication is not giving enough relief while using the spirometer. You develop fever of 100.5 F (38.1 C) or higher. SEEK IMMEDIATE MEDICAL CARE IF:  You cough up bloody sputum that had not been present before. You develop fever of 102 F (38.9 C) or greater. You develop worsening pain at or near the incision site. MAKE SURE YOU:  Understand these instructions. Will watch your condition. Will get help right away if you are not doing well or get worse. Document Released: 11/11/2006 Document Revised: 09/23/2011 Document Reviewed: 01/12/2007 ExitCare Patient Information 2014 ExitCare, [place].   ________________________________________________________________________  WHAT IS A BLOOD TRANSFUSION? Blood Transfusion Information  A transfusion is the  replacement of blood or some of its parts. Blood is made up of multiple cells which provide different functions. Red blood cells carry oxygen and are used for blood loss replacement. White blood cells fight against infection. Platelets control bleeding. Plasma helps clot blood. Other blood products are available for specialized needs, such as hemophilia or other clotting disorders. BEFORE THE TRANSFUSION  Who gives blood for transfusions?  Healthy volunteers who are fully evaluated to make sure their blood is safe. This is blood bank blood. Transfusion therapy is the safest it has ever been in the practice of medicine. Before blood is taken from a donor, a complete history is taken to make sure that person has no history of diseases nor engages in risky social behavior (examples are intravenous drug use or sexual activity with multiple partners). The donor's travel history is screened to minimize risk of transmitting infections, such as malaria. The donated blood is tested for signs of infectious diseases, such as HIV and hepatitis. The blood is then tested to be sure it is compatible with you in order to minimize the chance of a transfusion reaction. If you or a relative donates blood, this is often done in anticipation of surgery and is not appropriate for emergency situations. It takes many days to process the donated blood. RISKS AND COMPLICATIONS Although transfusion therapy is very safe and saves many lives, the main dangers of transfusion include:  Getting an infectious disease. Developing a transfusion reaction. This is an allergic reaction to something in the blood you were given. Every precaution is taken to prevent this. The decision to  have a blood transfusion has been considered carefully by your caregiver before blood is given. Blood is not given unless the benefits outweigh the risks. AFTER THE TRANSFUSION Right after receiving a blood transfusion, you will usually feel much better and  more energetic. This is especially true if your red blood cells have gotten low (anemic). The transfusion raises the level of the red blood cells which carry oxygen, and this usually causes an energy increase. The nurse administering the transfusion will monitor you carefully for complications. HOME CARE INSTRUCTIONS  No special instructions are needed after a transfusion. You may find your energy is better. Speak with your caregiver about any limitations on activity for underlying diseases you may have. SEEK MEDICAL CARE IF:  Your condition is not improving after your transfusion. You develop redness or irritation at the intravenous (IV) site. SEEK IMMEDIATE MEDICAL CARE IF:  Any of the following symptoms occur over the next 12 hours: Shaking chills. You have a temperature by mouth above 102 F (38.9 C), not controlled by medicine. Chest, back, or muscle pain. People around you feel you are not acting correctly or are confused. Shortness of breath or difficulty breathing. Dizziness and fainting. You get a rash or develop hives. You have a decrease in urine output. Your urine turns a dark color or changes to pink, red, or brown. Any of the following symptoms occur over the next 10 days: You have a temperature by mouth above 102 F (38.9 C), not controlled by medicine. Shortness of breath. Weakness after normal activity. The white part of the eye turns yellow (jaundice). You have a decrease in the amount of urine or are urinating less often. Your urine turns a dark color or changes to pink, red, or brown. Document Released: 06/28/2000 Document Revised: 09/23/2011 Document Reviewed: 02/15/2008 [facility] Patient Information 2014 [place], [place].  _______________________________________________________________________

## 2021-04-24 ENCOUNTER — Other Ambulatory Visit: Payer: Self-pay

## 2021-04-24 ENCOUNTER — Encounter (HOSPITAL_COMMUNITY): Payer: Self-pay

## 2021-04-24 ENCOUNTER — Encounter (HOSPITAL_COMMUNITY)
Admission: RE | Admit: 2021-04-24 | Discharge: 2021-04-24 | Disposition: A | Discharge status: Home / Self Care | Payer: Medicare Other | Source: Ambulatory Visit | Attending: Orthopedic Surgery | Admitting: Orthopedic Surgery

## 2021-04-24 DIAGNOSIS — Z87891 Personal history of nicotine dependence: Secondary | ICD-10-CM | POA: Insufficient documentation

## 2021-04-24 DIAGNOSIS — Z79899 Other long term (current) drug therapy: Secondary | ICD-10-CM | POA: Diagnosis not present

## 2021-04-24 DIAGNOSIS — Z01812 Encounter for preprocedural laboratory examination: Secondary | ICD-10-CM | POA: Insufficient documentation

## 2021-04-24 DIAGNOSIS — Z7982 Long term (current) use of aspirin: Secondary | ICD-10-CM | POA: Diagnosis not present

## 2021-04-24 HISTORY — DX: Essential (primary) hypertension: I10

## 2021-04-24 LAB — COMPREHENSIVE METABOLIC PANEL WITH GFR
ALT: 16 U/L (ref 0–44)
AST: 20 U/L (ref 15–41)
Albumin: 4.1 g/dL (ref 3.5–5.0)
Alkaline Phosphatase: 60 U/L (ref 38–126)
Anion gap: 3 — ABNORMAL LOW (ref 5–15)
BUN: 16 mg/dL (ref 8–23)
CO2: 30 mmol/L (ref 22–32)
Calcium: 9.6 mg/dL (ref 8.9–10.3)
Chloride: 105 mmol/L (ref 98–111)
Creatinine, Ser: 0.63 mg/dL (ref 0.44–1.00)
GFR, Estimated: >60 mL/min
Glucose, Bld: 67 mg/dL — ABNORMAL LOW (ref 70–99)
Potassium: 4.2 mmol/L (ref 3.5–5.1)
Sodium: 138 mmol/L (ref 135–145)
Total Bilirubin: 0.8 mg/dL (ref 0.3–1.2)
Total Protein: 6.9 g/dL (ref 6.5–8.1)

## 2021-04-24 LAB — CBC
HCT: 42.1 % (ref 36.0–46.0)
Hemoglobin: 14 g/dL (ref 12.0–15.0)
MCH: 32.3 pg (ref 26.0–34.0)
MCHC: 33.3 g/dL (ref 30.0–36.0)
MCV: 97 fL (ref 80.0–100.0)
Platelets: 224 10*3/uL (ref 150–400)
RBC: 4.34 MIL/uL (ref 3.87–5.11)
RDW: 12.8 % (ref 11.5–15.5)
WBC: 7 10*3/uL (ref 4.0–10.5)
nRBC: 0 % (ref 0.0–0.2)

## 2021-04-24 LAB — SURGICAL PCR SCREEN
MRSA, PCR: NEGATIVE
Staphylococcus aureus: NEGATIVE

## 2021-04-24 LAB — PROTIME-INR
INR: 1 (ref 0.8–1.2)
Prothrombin Time: 12.8 s (ref 11.4–15.2)

## 2021-05-01 DIAGNOSIS — M79641 Pain in right hand: Secondary | ICD-10-CM | POA: Diagnosis not present

## 2021-05-03 ENCOUNTER — Other Ambulatory Visit: Payer: Self-pay | Admitting: Orthopedic Surgery

## 2021-05-03 LAB — SARS CORONAVIRUS 2 (TAT 6-24 HRS): SARS Coronavirus 2: NEGATIVE

## 2021-05-07 ENCOUNTER — Observation Stay (HOSPITAL_COMMUNITY)
Admission: RE | Admit: 2021-05-07 | Discharge: 2021-05-08 | Disposition: A | Discharge status: Home / Self Care | Payer: Medicare Other | Source: Ambulatory Visit | Attending: Orthopedic Surgery | Admitting: Orthopedic Surgery

## 2021-05-07 ENCOUNTER — Other Ambulatory Visit: Payer: Self-pay

## 2021-05-07 ENCOUNTER — Ambulatory Visit (HOSPITAL_COMMUNITY): Payer: Medicare Other

## 2021-05-07 ENCOUNTER — Observation Stay (HOSPITAL_COMMUNITY): Payer: Medicare Other

## 2021-05-07 ENCOUNTER — Ambulatory Visit (HOSPITAL_COMMUNITY): Payer: Medicare Other | Admitting: Certified Registered"

## 2021-05-07 ENCOUNTER — Encounter (HOSPITAL_COMMUNITY): Payer: Self-pay | Admitting: Orthopedic Surgery

## 2021-05-07 ENCOUNTER — Encounter (HOSPITAL_COMMUNITY)
Admission: RE | Disposition: A | Discharge status: Home / Self Care | Payer: Self-pay | Source: Ambulatory Visit | Attending: Orthopedic Surgery

## 2021-05-07 DIAGNOSIS — Z471 Aftercare following joint replacement surgery: Secondary | ICD-10-CM | POA: Diagnosis not present

## 2021-05-07 DIAGNOSIS — Z7982 Long term (current) use of aspirin: Secondary | ICD-10-CM | POA: Insufficient documentation

## 2021-05-07 DIAGNOSIS — S72041A Displaced fracture of base of neck of right femur, initial encounter for closed fracture: Secondary | ICD-10-CM

## 2021-05-07 DIAGNOSIS — I10 Essential (primary) hypertension: Secondary | ICD-10-CM | POA: Insufficient documentation

## 2021-05-07 DIAGNOSIS — Z96652 Presence of left artificial knee joint: Secondary | ICD-10-CM | POA: Diagnosis not present

## 2021-05-07 DIAGNOSIS — Z79899 Other long term (current) drug therapy: Secondary | ICD-10-CM | POA: Diagnosis not present

## 2021-05-07 DIAGNOSIS — M1611 Unilateral primary osteoarthritis, right hip: Principal | ICD-10-CM | POA: Insufficient documentation

## 2021-05-07 DIAGNOSIS — Z96642 Presence of left artificial hip joint: Secondary | ICD-10-CM | POA: Diagnosis not present

## 2021-05-07 DIAGNOSIS — Z96649 Presence of unspecified artificial hip joint: Secondary | ICD-10-CM

## 2021-05-07 DIAGNOSIS — M169 Osteoarthritis of hip, unspecified: Secondary | ICD-10-CM | POA: Diagnosis present

## 2021-05-07 DIAGNOSIS — Z96643 Presence of artificial hip joint, bilateral: Secondary | ICD-10-CM | POA: Diagnosis not present

## 2021-05-07 DIAGNOSIS — K589 Irritable bowel syndrome without diarrhea: Secondary | ICD-10-CM | POA: Diagnosis not present

## 2021-05-07 DIAGNOSIS — J309 Allergic rhinitis, unspecified: Secondary | ICD-10-CM | POA: Diagnosis not present

## 2021-05-07 DIAGNOSIS — L309 Dermatitis, unspecified: Secondary | ICD-10-CM | POA: Diagnosis not present

## 2021-05-07 HISTORY — PX: TOTAL HIP ARTHROPLASTY: SHX124

## 2021-05-07 LAB — TYPE AND SCREEN
ABO/RH(D): O POS
Antibody Screen: NEGATIVE

## 2021-05-07 SURGERY — TOTAL HIP ARTHROPLASTY ANTERIOR APPROACH
Anesthesia: Monitor Anesthesia Care | Site: Hip | Laterality: Right

## 2021-05-07 MED ORDER — ONDANSETRON HCL 4 MG/2ML IJ SOLN
INTRAMUSCULAR | Status: DC | PRN
  Administered 2021-05-07: 4 mg via INTRAVENOUS

## 2021-05-07 MED ORDER — PANTOPRAZOLE SODIUM 40 MG PO TBEC
40.0000 mg | DELAYED_RELEASE_TABLET | Freq: Every day | ORAL | Status: DC
  Administered 2021-05-08: 40 mg via ORAL
  Filled 2021-05-07: qty 1

## 2021-05-07 MED ORDER — PROPOFOL 10 MG/ML IV BOLUS
INTRAVENOUS | Status: DC | PRN
  Administered 2021-05-07 (×2): 30 mg via INTRAVENOUS
  Administered 2021-05-07: 10 mg via INTRAVENOUS
  Administered 2021-05-07: 20 mg via INTRAVENOUS

## 2021-05-07 MED ORDER — POLYETHYLENE GLYCOL 3350 17 G PO PACK: 17.0000 g | PACK | Freq: Every day | ORAL | Status: DC | PRN

## 2021-05-07 MED ORDER — FENTANYL CITRATE (PF) 100 MCG/2ML IJ SOLN
INTRAMUSCULAR | Status: DC | PRN
  Administered 2021-05-07 (×2): 50 ug via INTRAVENOUS

## 2021-05-07 MED ORDER — BISACODYL 10 MG RE SUPP: 10.0000 mg | Freq: Every day | RECTAL | Status: DC | PRN

## 2021-05-07 MED ORDER — BUPIVACAINE-EPINEPHRINE (PF) 0.25% -1:200000 IJ SOLN
INTRAMUSCULAR | Status: AC
  Filled 2021-05-07: qty 30

## 2021-05-07 MED ORDER — ACETAMINOPHEN 10 MG/ML IV SOLN
INTRAVENOUS | Status: AC
  Filled 2021-05-07: qty 100

## 2021-05-07 MED ORDER — MORPHINE SULFATE (PF) 2 MG/ML IV SOLN
0.5000 mg | INTRAVENOUS | Status: DC | PRN
  Administered 2021-05-08: 1 mg via INTRAVENOUS
  Filled 2021-05-07: qty 1

## 2021-05-07 MED ORDER — FENTANYL CITRATE PF 50 MCG/ML IJ SOSY
25.0000 ug | PREFILLED_SYRINGE | INTRAMUSCULAR | Status: DC | PRN
  Administered 2021-05-07 (×2): 25 ug via INTRAVENOUS

## 2021-05-07 MED ORDER — ONDANSETRON HCL 4 MG/2ML IJ SOLN: 4.0000 mg | Freq: Once | INTRAMUSCULAR | Status: DC | PRN

## 2021-05-07 MED ORDER — OXYCODONE HCL 5 MG PO TABS
ORAL_TABLET | ORAL | Status: AC
  Filled 2021-05-07: qty 1

## 2021-05-07 MED ORDER — CHLORHEXIDINE GLUCONATE 0.12 % MT SOLN
15.0000 mL | Freq: Once | OROMUCOSAL | Status: AC
  Administered 2021-05-07: 15 mL via OROMUCOSAL

## 2021-05-07 MED ORDER — ORAL CARE MOUTH RINSE: 15.0000 mL | Freq: Once | OROMUCOSAL | Status: AC

## 2021-05-07 MED ORDER — ACETAMINOPHEN 10 MG/ML IV SOLN
1000.0000 mg | Freq: Four times a day (QID) | INTRAVENOUS | Status: DC
  Administered 2021-05-07: 1000 mg via INTRAVENOUS

## 2021-05-07 MED ORDER — OXYCODONE HCL 5 MG PO TABS
5.0000 mg | ORAL_TABLET | Freq: Once | ORAL | Status: AC | PRN
  Administered 2021-05-07: 5 mg via ORAL

## 2021-05-07 MED ORDER — ALBUMIN HUMAN 5 % IV SOLN
12.5000 g | Freq: Once | INTRAVENOUS | Status: AC
  Administered 2021-05-07: 12.5 g via INTRAVENOUS

## 2021-05-07 MED ORDER — FENTANYL CITRATE PF 50 MCG/ML IJ SOSY
PREFILLED_SYRINGE | INTRAMUSCULAR | Status: AC
  Filled 2021-05-07: qty 1

## 2021-05-07 MED ORDER — DEXAMETHASONE SODIUM PHOSPHATE 10 MG/ML IJ SOLN
10.0000 mg | Freq: Once | INTRAMUSCULAR | Status: AC
  Administered 2021-05-08: 10 mg via INTRAVENOUS
  Filled 2021-05-07: qty 1

## 2021-05-07 MED ORDER — ALBUMIN HUMAN 5 % IV SOLN
INTRAVENOUS | Status: AC
  Administered 2021-05-07: 12.5 g via INTRAVENOUS
  Filled 2021-05-07: qty 500

## 2021-05-07 MED ORDER — CEFAZOLIN SODIUM-DEXTROSE 2-4 GM/100ML-% IV SOLN
2.0000 g | INTRAVENOUS | Status: AC
  Administered 2021-05-07: 2 g via INTRAVENOUS

## 2021-05-07 MED ORDER — METOPROLOL SUCCINATE ER 25 MG PO TB24
12.5000 mg | ORAL_TABLET | Freq: Every day | ORAL | Status: DC
  Administered 2021-05-07: 12.5 mg via ORAL
  Filled 2021-05-07: qty 1

## 2021-05-07 MED ORDER — DOCUSATE SODIUM 100 MG PO CAPS
100.0000 mg | ORAL_CAPSULE | Freq: Two times a day (BID) | ORAL | Status: DC
  Administered 2021-05-07 – 2021-05-08 (×2): 100 mg via ORAL
  Filled 2021-05-07 (×2): qty 1

## 2021-05-07 MED ORDER — CEFAZOLIN SODIUM-DEXTROSE 2-4 GM/100ML-% IV SOLN
INTRAVENOUS | Status: AC
  Filled 2021-05-07: qty 100

## 2021-05-07 MED ORDER — ONDANSETRON HCL 4 MG/2ML IJ SOLN
INTRAMUSCULAR | Status: AC
  Filled 2021-05-07: qty 2

## 2021-05-07 MED ORDER — OXYCODONE HCL 5 MG PO TABS
5.0000 mg | ORAL_TABLET | ORAL | Status: DC | PRN
  Administered 2021-05-07: 10 mg via ORAL
  Administered 2021-05-07: 5 mg via ORAL
  Administered 2021-05-07 – 2021-05-08 (×3): 10 mg via ORAL
  Filled 2021-05-07: qty 1
  Filled 2021-05-07 (×4): qty 2

## 2021-05-07 MED ORDER — METHOCARBAMOL 500 MG IVPB - SIMPLE MED
500.0000 mg | Freq: Four times a day (QID) | INTRAVENOUS | Status: DC | PRN
  Filled 2021-05-07: qty 50

## 2021-05-07 MED ORDER — 0.9 % SODIUM CHLORIDE (POUR BTL) OPTIME
TOPICAL | Status: DC | PRN
  Administered 2021-05-07: 1000 mL

## 2021-05-07 MED ORDER — GLYCOPYRROLATE 0.2 MG/ML IJ SOLN
INTRAMUSCULAR | Status: AC
  Filled 2021-05-07: qty 3

## 2021-05-07 MED ORDER — ACETAMINOPHEN 325 MG PO TABS: 325.0000 mg | ORAL_TABLET | Freq: Four times a day (QID) | ORAL | Status: DC | PRN

## 2021-05-07 MED ORDER — TRANEXAMIC ACID-NACL 1000-0.7 MG/100ML-% IV SOLN
INTRAVENOUS | Status: AC
  Filled 2021-05-07: qty 100

## 2021-05-07 MED ORDER — CEFAZOLIN SODIUM-DEXTROSE 2-4 GM/100ML-% IV SOLN
2.0000 g | Freq: Four times a day (QID) | INTRAVENOUS | Status: AC
  Administered 2021-05-07 (×2): 2 g via INTRAVENOUS
  Filled 2021-05-07 (×2): qty 100

## 2021-05-07 MED ORDER — LACTATED RINGERS IV SOLN: INTRAVENOUS | Status: DC

## 2021-05-07 MED ORDER — SODIUM CHLORIDE 0.9 % IV SOLN: INTRAVENOUS | Status: DC

## 2021-05-07 MED ORDER — TRANEXAMIC ACID-NACL 1000-0.7 MG/100ML-% IV SOLN
1000.0000 mg | INTRAVENOUS | Status: AC
  Administered 2021-05-07: 1000 mg via INTRAVENOUS

## 2021-05-07 MED ORDER — PROPOFOL 500 MG/50ML IV EMUL
INTRAVENOUS | Status: DC | PRN
  Administered 2021-05-07: 80 ug/kg/min via INTRAVENOUS

## 2021-05-07 MED ORDER — PHENYLEPHRINE HCL (PRESSORS) 10 MG/ML IV SOLN
INTRAVENOUS | Status: AC
  Filled 2021-05-07: qty 2

## 2021-05-07 MED ORDER — DEXAMETHASONE SODIUM PHOSPHATE 10 MG/ML IJ SOLN
INTRAMUSCULAR | Status: AC
  Filled 2021-05-07: qty 1

## 2021-05-07 MED ORDER — ALBUMIN HUMAN 5 % IV SOLN: 12.5000 g | Freq: Once | INTRAVENOUS | Status: AC

## 2021-05-07 MED ORDER — BUPIVACAINE IN DEXTROSE 0.75-8.25 % IT SOLN
INTRATHECAL | Status: DC | PRN
  Administered 2021-05-07: 1.6 mL via INTRATHECAL

## 2021-05-07 MED ORDER — OXYCODONE HCL 5 MG/5ML PO SOLN: 5.0000 mg | Freq: Once | ORAL | Status: AC | PRN

## 2021-05-07 MED ORDER — PHENYLEPHRINE HCL-NACL 20-0.9 MG/250ML-% IV SOLN
INTRAVENOUS | Status: DC | PRN
  Administered 2021-05-07: 30 ug/min via INTRAVENOUS

## 2021-05-07 MED ORDER — DEXAMETHASONE SODIUM PHOSPHATE 10 MG/ML IJ SOLN
8.0000 mg | Freq: Once | INTRAMUSCULAR | Status: AC
  Administered 2021-05-07: 8 mg via INTRAVENOUS

## 2021-05-07 MED ORDER — TEMAZEPAM 15 MG PO CAPS
15.0000 mg | ORAL_CAPSULE | Freq: Every evening | ORAL | Status: DC | PRN
  Administered 2021-05-07: 15 mg via ORAL
  Filled 2021-05-07: qty 1

## 2021-05-07 MED ORDER — METOCLOPRAMIDE HCL 5 MG/ML IJ SOLN: 5.0000 mg | Freq: Three times a day (TID) | INTRAMUSCULAR | Status: DC | PRN

## 2021-05-07 MED ORDER — ONDANSETRON HCL 4 MG/2ML IJ SOLN: 4.0000 mg | Freq: Four times a day (QID) | INTRAMUSCULAR | Status: DC | PRN

## 2021-05-07 MED ORDER — FENTANYL CITRATE (PF) 100 MCG/2ML IJ SOLN
INTRAMUSCULAR | Status: AC
  Filled 2021-05-07: qty 2

## 2021-05-07 MED ORDER — POVIDONE-IODINE 10 % EX SWAB
2.0000 "application " | Freq: Once | CUTANEOUS | Status: AC
  Administered 2021-05-07: 2 "application " via TOPICAL

## 2021-05-07 MED ORDER — GLYCOPYRROLATE PF 0.2 MG/ML IJ SOSY
PREFILLED_SYRINGE | INTRAMUSCULAR | Status: DC | PRN
  Administered 2021-05-07: .2 mg via INTRAVENOUS

## 2021-05-07 MED ORDER — ASPIRIN EC 325 MG PO TBEC
325.0000 mg | DELAYED_RELEASE_TABLET | Freq: Two times a day (BID) | ORAL | Status: DC
  Administered 2021-05-08: 325 mg via ORAL
  Filled 2021-05-07: qty 1

## 2021-05-07 MED ORDER — ATORVASTATIN CALCIUM 20 MG PO TABS: 20.0000 mg | ORAL_TABLET | Freq: Every day | ORAL | Status: DC

## 2021-05-07 MED ORDER — ALPRAZOLAM 0.25 MG PO TABS
0.2500 mg | ORAL_TABLET | Freq: Two times a day (BID) | ORAL | Status: DC | PRN
  Administered 2021-05-07: 0.25 mg via ORAL
  Filled 2021-05-07: qty 1

## 2021-05-07 MED ORDER — WATER FOR IRRIGATION, STERILE IR SOLN
Status: DC | PRN
  Administered 2021-05-07: 2000 mL

## 2021-05-07 MED ORDER — ONDANSETRON HCL 4 MG PO TABS: 4.0000 mg | ORAL_TABLET | Freq: Four times a day (QID) | ORAL | Status: DC | PRN

## 2021-05-07 MED ORDER — MENTHOL 3 MG MT LOZG: 1.0000 | LOZENGE | OROMUCOSAL | Status: DC | PRN

## 2021-05-07 MED ORDER — METHOCARBAMOL 500 MG PO TABS
500.0000 mg | ORAL_TABLET | Freq: Four times a day (QID) | ORAL | Status: DC | PRN
  Administered 2021-05-07 – 2021-05-08 (×2): 500 mg via ORAL
  Filled 2021-05-07 (×2): qty 1

## 2021-05-07 MED ORDER — TRAMADOL HCL 50 MG PO TABS
50.0000 mg | ORAL_TABLET | Freq: Four times a day (QID) | ORAL | Status: DC | PRN
  Administered 2021-05-07 (×2): 100 mg via ORAL
  Filled 2021-05-07 (×2): qty 2

## 2021-05-07 MED ORDER — PHENOL 1.4 % MT LIQD: 1.0000 | OROMUCOSAL | Status: DC | PRN

## 2021-05-07 MED ORDER — BUPIVACAINE-EPINEPHRINE (PF) 0.25% -1:200000 IJ SOLN
INTRAMUSCULAR | Status: DC | PRN
  Administered 2021-05-07: 30 mL

## 2021-05-07 MED ORDER — METOCLOPRAMIDE HCL 5 MG PO TABS: 5.0000 mg | ORAL_TABLET | Freq: Three times a day (TID) | ORAL | Status: DC | PRN

## 2021-05-07 SURGICAL SUPPLY — 42 items
BAG COUNTER SPONGE SURGICOUNT (BAG)
BAG DECANTER FOR FLEXI CONT (MISCELLANEOUS)
BAG SPEC THK2 15X12 ZIP CLS (MISCELLANEOUS)
BAG SPNG CNTER NS LX DISP (BAG)
BAG ZIPLOCK 12X15 (MISCELLANEOUS)
BLADE SAG 18X100X1.27 (BLADE) ×2
CLSR STERI-STRIP ANTIMIC 1/2X4 (GAUZE/BANDAGES/DRESSINGS) ×2
COVER PERINEAL POST (MISCELLANEOUS) ×2
COVER SURGICAL LIGHT HANDLE (MISCELLANEOUS) ×2
DECANTER SPIKE VIAL GLASS SM (MISCELLANEOUS) ×2
DRAPE FOOT SWITCH (DRAPES) ×2
DRAPE STERI IOBAN 125X83 (DRAPES) ×2
DRAPE U-SHAPE 47X51 STRL (DRAPES) ×4
DRSG AQUACEL AG ADV 3.5X10 (GAUZE/BANDAGES/DRESSINGS) ×2
DURAPREP 26ML APPLICATOR (WOUND CARE) ×2
ELECT REM PT RETURN 15FT ADLT (MISCELLANEOUS) ×2
GLOVE SRG 8 PF TXTR STRL LF DI (GLOVE) ×1
GLOVE SURG ENC MOIS LTX SZ6.5 (GLOVE) ×2
GLOVE SURG ENC MOIS LTX SZ7 (GLOVE) ×2
GLOVE SURG ENC MOIS LTX SZ8 (GLOVE) ×4
GLOVE SURG UNDER POLY LF SZ7 (GLOVE) ×2
GLOVE SURG UNDER POLY LF SZ8 (GLOVE) ×2
GLOVE SURG UNDER POLY LF SZ8.5 (GLOVE)
GOWN STRL REUS W/TWL LRG LVL3 (GOWN DISPOSABLE) ×4
GOWN STRL REUS W/TWL XL LVL3 (GOWN DISPOSABLE)
HIP BALL CERAMIC (Hips) ×2 IMPLANT
HOLDER FOLEY CATH W/STRAP (MISCELLANEOUS) ×2
LINER MARATHON 32 50 (Hips) ×2 IMPLANT
MANIFOLD NEPTUNE II (INSTRUMENTS) ×2
PACK ANTERIOR HIP CUSTOM (KITS) ×2
PENCIL SMOKE EVACUATOR COATED (MISCELLANEOUS) ×2
PINNACLE SECTOR CUP 50MM (Hips) ×2 IMPLANT
STEM FEMORAL SZ 5MM STD ACTIS (Stem) ×2 IMPLANT
STRIP CLOSURE SKIN 1/2X4 (GAUZE/BANDAGES/DRESSINGS) ×2
SUT ETHIBOND NAB CT1 #1 30IN (SUTURE) ×2
SUT MNCRL AB 4-0 PS2 18 (SUTURE) ×2
SUT STRATAFIX 0 PDS 27 VIOLET (SUTURE) ×2
SUT VIC AB 2-0 CT1 27 (SUTURE) ×4
SUT VIC AB 2-0 CT1 TAPERPNT 27 (SUTURE) ×2
SYR 50ML LL SCALE MARK (SYRINGE)
TRAY FOLEY MTR SLVR 16FR STAT (SET/KITS/TRAYS/PACK) ×2
TUBE SUCTION HIGH CAP CLEAR NV (SUCTIONS) ×2

## 2021-05-07 NOTE — Discharge Instructions (Signed)
Jennifer Arabian, MD Total Joint Specialist [facility] [address], [address] [place], [place] [address] [phone]  ANTERIOR APPROACH TOTAL HIP REPLACEMENT POSTOPERATIVE DIRECTIONS     Hip Rehabilitation, Guidelines Following Surgery  The results of a hip operation are greatly improved after range of motion and muscle strengthening exercises. Follow all safety measures which are given to protect your hip. If any of these exercises cause increased pain or swelling in your joint, decrease the amount until you are comfortable again. Then slowly increase the exercises. Call your caregiver if you have problems or questions.   BLOOD CLOT PREVENTION Take a 325 mg Aspirin two times a day for three weeks following surgery. Then resume one 81 mg Aspirin once a day. You may resume your vitamins/supplements upon discharge from the hospital. Do not take any NSAIDs (Advil, Aleve, Ibuprofen, Meloxicam, etc.) until you have discontinued the 325 mg Aspirin.  HOME CARE INSTRUCTIONS  Remove items at home which could result in a fall. This includes throw rugs or furniture in walking pathways.  ICE to the affected hip as frequently as 20-30 minutes an hour and then as needed for pain and swelling. Continue to use ice on the hip for pain and swelling from surgery. You may notice swelling that will progress down to the foot and ankle. This is normal after surgery. Elevate the leg when you are not up walking on it.   Continue to use the breathing machine which will help keep your temperature down.  It is common for your temperature to cycle up and down following surgery, especially at night when you are not up moving around and exerting yourself.  The breathing machine keeps your lungs expanded and your temperature down.  DIET You may resume your previous home diet once your are discharged from the hospital.  DRESSING / WOUND CARE / SHOWERING You have an adhesive waterproof bandage over the  incision. Leave this in place until your first follow-up appointment. Once you remove this you will not need to place another bandage.  You may begin showering 3 days following surgery, but do not submerge the incision under water.  ACTIVITY For the first 3-5 days, it is important to rest and keep the operative leg elevated. You should, as a general rule, rest for 50 minutes and walk/stretch for 10 minutes per hour. After 5 days, you may slowly increase activity as tolerated.  Perform the exercises you were provided twice a day for about 15-20 minutes each session. Begin these 2 days following surgery. Walk with your walker as instructed. Use the walker until you are comfortable transitioning to a cane. Walk with the cane in the opposite hand of the operative leg. You may discontinue the cane once you are comfortable and walking steadily. Avoid periods of inactivity such as sitting longer than an hour when not asleep. This helps prevent blood clots.  Do not drive a car for 6 weeks or until released by your surgeon.  Do not drive while taking narcotics.  TED HOSE STOCKINGS Wear the elastic stockings on both legs for three weeks following surgery during the day. You may remove them at night while sleeping.  WEIGHT BEARING Weight bearing as tolerated with assist device (walker, cane, etc) as directed, use it as long as suggested by your surgeon or therapist, typically at least 4-6 weeks.  POSTOPERATIVE CONSTIPATION PROTOCOL Constipation - defined medically as fewer than three stools per week and severe constipation as less than one stool per week.  less than one stool per week.  One of the most common issues patients have following surgery is constipation.  Even if you have a regular bowel pattern at home, your normal regimen is likely to be disrupted due to multiple reasons following surgery.  Combination of anesthesia, postoperative narcotics, change in appetite and fluid intake all can  affect your bowels.  In order to avoid complications following surgery, here are some recommendations in order to help you during your recovery period.  Colace (docusate) - Pick up an over-the-counter form of Colace or another stool softener and take twice a day as long as you are requiring postoperative pain medications.  Take with a full glass of water daily.  If you experience loose stools or diarrhea, hold the colace until you stool forms back up.  If your symptoms do not get better within 1 week or if they get worse, check with your doctor. Dulcolax (bisacodyl) - Pick up over-the-counter and take as directed by the product packaging as needed to assist with the movement of your bowels.  Take with a full glass of water.  Use this product as needed if not relieved by Colace only.  MiraLax (polyethylene glycol) - Pick up over-the-counter to have on hand.  MiraLax is a solution that will increase the amount of water in your bowels to assist with bowel movements.  Take as directed and can mix with a glass of water, juice, soda, coffee, or tea.  Take if you go more than two days without a movement.Do not use MiraLax more than once per day. Call your doctor if you are still constipated or irregular after using this medication for 7 days in a row.  If you continue to have problems with postoperative constipation, please contact the office for further assistance and recommendations.  If you experience "the worst abdominal pain ever" or develop nausea or vomiting, please contact the office immediatly for further recommendations for treatment.  ITCHING  If you experience itching with your medications, try taking only a single pain pill, or even half a pain pill at a time.  You can also use Benadryl over the counter for itching or also to help with sleep.   MEDICATIONS See your medication summary on the "After Visit Summary" that the nursing staff will review with you prior to discharge.  You may have some home  medications which will be placed on hold until you complete the course of blood thinner medication.  It is important for you to complete the blood thinner medication as prescribed by your surgeon.  Continue your approved medications as instructed at time of discharge.  PRECAUTIONS If you experience chest pain or shortness of breath - call 911 immediately for transfer to the hospital emergency department.  If you develop a fever greater that 101 F, purulent drainage from wound, increased redness or drainage from wound, foul odor from the wound/dressing, or calf pain - CONTACT YOUR SURGEON.                                                   FOLLOW-UP APPOINTMENTS Make sure you keep all of your appointments after your operation with your surgeon and caregivers. You should call the office at the above phone number and make an appointment for approximately two weeks after the date of your surgery or on the   date instructed by your surgeon outlined in the "After Visit Summary".  RANGE OF MOTION AND STRENGTHENING EXERCISES  These exercises are designed to help you keep full movement of your hip joint. Follow your caregiver's or physical therapist's instructions. Perform all exercises about fifteen times, three times per day or as directed. Exercise both hips, even if you have had only one joint replacement. These exercises can be done on a training (exercise) mat, on the floor, on a table or on a bed. Use whatever works the best and is most comfortable for you. Use music or television while you are exercising so that the exercises are a pleasant break in your day. This will make your life better with the exercises acting as a break in routine you can look forward to.  Lying on your back, slowly slide your foot toward your buttocks, raising your knee up off the floor. Then slowly slide your foot back down until your leg is straight again.  Lying on your back spread your legs as far apart as you can without causing  discomfort.  Lying on your side, raise your upper leg and foot straight up from the floor as far as is comfortable. Slowly lower the leg and repeat.  Lying on your back, tighten up the muscle in the front of your thigh (quadriceps muscles). You can do this by keeping your leg straight and trying to raise your heel off the floor. This helps strengthen the largest muscle supporting your knee.  Lying on your back, tighten up the muscles of your buttocks both with the legs straight and with the knee bent at a comfortable angle while keeping your heel on the floor.   POST-OPERATIVE OPIOID TAPER INSTRUCTIONS: It is important to wean off of your opioid medication as soon as possible. If you do not need pain medication after your surgery it is ok to stop day one. Opioids include: Codeine, Hydrocodone(Norco, Vicodin), Oxycodone(Percocet, oxycontin) and hydromorphone amongst others.  Long term and even short term use of opiods can cause: Increased pain response Dependence Constipation Depression Respiratory depression And more.  Withdrawal symptoms can include Flu like symptoms Nausea, vomiting And more Techniques to manage these symptoms Hydrate well Eat regular healthy meals Stay active Use relaxation techniques(deep breathing, meditating, yoga) Do Not substitute Alcohol to help with tapering If you have been on opioids for less than two weeks and do not have pain than it is ok to stop all together.  Plan to wean off of opioids This plan should start within one week post op of your joint replacement. Maintain the same interval or time between taking each dose and first decrease the dose.  Cut the total daily intake of opioids by one tablet each day Next start to increase the time between doses. The last dose that should be eliminated is the evening dose.   IF YOU ARE TRANSFERRED TO A SKILLED REHAB FACILITY If the patient is transferred to a skilled rehab facility following release from the  hospital, a list of the current medications will be sent to the facility for the patient to continue.  When discharged from the skilled rehab facility, please have the facility set up the patient's Home Health Physical Therapy prior to being released. Also, the skilled facility will be responsible for providing the patient with their medications at time of release from the facility to include their pain medication, the muscle relaxants, and their blood thinner medication. If the patient is still at the rehab facility   up appointment, the skilled rehab facility will also need to assist the patient in arranging follow up appointment in our office and any transportation needs.  MAKE SURE YOU:  Understand these instructions.  Get help right away if you are not doing well or get worse.    DENTAL ANTIBIOTICS:  In most cases prophylactic antibiotics for Dental procdeures after total joint surgery are not necessary.  Exceptions are as follows:  1. History of prior total joint infection  2. Severely immunocompromised (Organ Transplant, cancer chemotherapy, Rheumatoid biologic meds such as Humera)  3. Poorly controlled diabetes (A1C &gt; 8.0, blood glucose over 200)  If you have one of these conditions, contact your surgeon for an antibiotic prescription, prior to your dental procedure.    Pick up stool softner and laxative for home use following surgery while on pain medications. Do not submerge incision under water. Please use good hand washing techniques while changing dressing each day. May shower starting three days after surgery. Please use a clean towel to pat the incision dry following showers. Continue to use ice for pain and swelling after surgery. Do not use any lotions or creams on the incision until instructed by your surgeon.  

## 2021-05-07 NOTE — Interval H&P Note (Signed)
History and Physical Interval Note:  05/07/2021 9:42 AM  [name]  has presented today for surgery, with the diagnosis of Right hip osteoarthritis.  The various methods of treatment have been discussed with the patient and family. After consideration of risks, benefits and other options for treatment, the patient has consented to  Procedure(s): TOTAL HIP ARTHROPLASTY ANTERIOR APPROACH (Right) as a surgical intervention.  The patient's history has been reviewed, patient examined, no change in status, stable for surgery.  I have reviewed the patient's chart and labs.  Questions were answered to the patient's satisfaction.     Pilar Plate Ardel Jagger

## 2021-05-07 NOTE — Transfer of Care (Signed)
Immediate Anesthesia Transfer of Care Note  Patient: [name]  Procedure(s) Performed: TOTAL HIP ARTHROPLASTY ANTERIOR APPROACH (Right: Hip)  Patient Location: PACU  Anesthesia Type:MAC and Spinal  Level of Consciousness: awake, alert , oriented and patient cooperative  Airway & Oxygen Therapy: Patient Spontanous Breathing and Patient connected to face mask oxygen  Post-op Assessment: Report given to RN, Post -op Vital signs reviewed and stable and Patient moving all extremities  Post vital signs: Reviewed and stable  Last Vitals:  Vitals Value Taken Time  BP 88/50 05/07/21 1324  Temp    Pulse 61 05/07/21 1325  Resp 12 05/07/21 1325  SpO2 100 % 05/07/21 1325  Vitals shown include unvalidated device data.  Last Pain:  Vitals:   05/07/21 0932  TempSrc:   PainSc: 5       Patients Stated Pain Goal: 3 (57/84/69 [id number])  Complications: No notable events documented.

## 2021-05-07 NOTE — Progress Notes (Signed)
PT Note  Patient Details Name: [name] MRN: [id number] DOB: 1946/04/24    Eval completed . Pt did well ambulating in room with RW with min guard assist. Anticipate pt will do well tomorrow and likely DC home after 1 visit. Full write up to follow.    Clide Dales 05/07/2021, 7:19 PM

## 2021-05-07 NOTE — Op Note (Signed)
OPERATIVE REPORT- TOTAL HIP ARTHROPLASTY   PREOPERATIVE DIAGNOSIS: Osteoarthritis of the Right hip.   POSTOPERATIVE DIAGNOSIS: Osteoarthritis of the Right  hip.   PROCEDURE: Right total hip arthroplasty, anterior approach.   SURGEON: Gaynelle Arabian, MD   ASSISTANT: Molli Barrows, PA-C  ANESTHESIA:  Spinal  ESTIMATED BLOOD LOSS:-800 mL    DRAINS: Hemovac x1.   COMPLICATIONS: None   CONDITION: PACU - hemodynamically stable.   BRIEF CLINICAL NOTE: [name] is a 75 y.o. female who has advanced end-  stage arthritis of their Right  hip with progressively worsening pain and  dysfunction.The patient has failed nonoperative management and presents for  total hip arthroplasty.   PROCEDURE IN DETAIL: After successful administration of spinal  anesthetic, the traction boots for the [facility] bed were placed on both  feet and the patient was placed onto the [facility] bed, boots placed into the leg  holders. The Right hip was then isolated from the perineum with plastic  drapes and prepped and draped in the usual sterile fashion. ASIS and  greater trochanter were marked and a oblique incision was made, starting  at about 1 cm lateral and 2 cm distal to the ASIS and coursing towards  the anterior cortex of the femur. The skin was cut with a 10 blade  through subcutaneous tissue to the level of the fascia overlying the  tensor fascia lata muscle. The fascia was then incised in line with the  incision at the junction of the anterior third and posterior 2/3rd. The  muscle was teased off the fascia and then the interval between the TFL  and the rectus was developed. The Hohmann retractor was then placed at  the top of the femoral neck over the capsule. The vessels overlying the  capsule were cauterized and the fat on top of the capsule was removed.  A Hohmann retractor was then placed anterior underneath the rectus  femoris to give exposure to the entire anterior capsule. A T-shaped   capsulotomy was performed. The edges were tagged and the femoral head  was identified.       Osteophytes are removed off the superior acetabulum.  The femoral neck was then cut in situ with an oscillating saw. Traction  was then applied to the left lower extremity utilizing the [facility]  traction. The femoral head was then removed. Retractors were placed  around the acetabulum and then circumferential removal of the labrum was  performed. Osteophytes were also removed. Reaming starts at 47 mm to  medialize and  Increased in 2 mm increments to 49 mm. We reamed in  approximately 40 degrees of abduction, 20 degrees anteversion. A 50 mm  pinnacle acetabular shell was then impacted in anatomic position under  fluoroscopic guidance with excellent purchase. We did not need to place  any additional dome screws. A 32 mm neutral + 4 marathon liner was then  placed into the acetabular shell.       The femoral lift was then placed along the lateral aspect of the femur  just distal to the vastus ridge. The leg was  externally rotated and capsule  was stripped off the inferior aspect of the femoral neck down to the  level of the lesser trochanter, this was done with electrocautery. The femur was lifted after this was performed. The  leg was then placed in an extended and adducted position essentially delivering the femur. We also removed the capsule superiorly and the piriformis from the piriformis  fossa to gain excellent exposure of the  proximal femur. Rongeur was used to remove some cancellous bone to get  into the lateral portion of the proximal femur for placement of the  initial starter reamer. The starter broaches was placed  the starter broach  and was shown to go down the center of the canal. Broaching  with the Actis system was then performed starting at size 0  coursing  Up to size 5. A size 5 had excellent torsional and rotational  and axial stability. The trial standard offset neck was then  placed  with a 32 + 5 trial head. The hip was then reduced. We confirmed that  the stem was in the canal both on AP and lateral x-rays. It also has excellent sizing. The hip was reduced with outstanding stability through full extension and full external rotation.. AP pelvis was taken and the leg lengths were measured and found to be equal. Hip was then dislocated again and the femoral head and neck removed. The  femoral broach was removed. Size 5 Actis stem with a standard offset  neck was then impacted into the femur following native anteversion. Has  excellent purchase in the canal. Excellent torsional and rotational and  axial stability. It is confirmed to be in the canal on AP and lateral  fluoroscopic views. The 32 + 5 ceramic head was placed and the hip  reduced with outstanding stability. Again AP pelvis was taken and it  confirmed that the leg lengths were equal. The wound was then copiously  irrigated with saline solution and the capsule reattached and repaired  with Ethibond suture. 30 ml of .25% Bupivicaine was  injected into the capsule and into the edge of the tensor fascia lata as well as subcutaneous tissue. The fascia overlying the tensor fascia lata was then closed with a running #1 V-Loc. Subcu was closed with interrupted 2-0 Vicryl and subcuticular running 4-0 Monocryl. Incision was cleaned  and dried. Steri-Strips and a bulky sterile dressing applied. The patient was awakened and transported to  recovery in stable condition.        Please note that a surgical assistant was a medical necessity for this procedure to perform it in a safe and expeditious manner. Assistant was necessary to provide appropriate retraction of vital neurovascular structures and to prevent femoral fracture and allow for anatomic placement of the prosthesis.  Gaynelle Arabian, M.D.

## 2021-05-07 NOTE — Anesthesia Procedure Notes (Addendum)
Spinal  Patient location during procedure: OR Start time: 05/07/2021 11:45 AM End time: 05/07/2021 11:57 AM Reason for block: surgical anesthesia Staffing Performed: anesthesiologist  Anesthesiologist: Pervis Hocking, DO Preanesthetic Checklist Completed: patient identified, IV checked, site marked, risks and benefits discussed, surgical consent, monitors and equipment checked, pre-op evaluation and timeout performed Spinal Block Patient position: sitting Prep: DuraPrep Patient monitoring: heart rate, cardiac monitor, continuous pulse ox and blood pressure Approach: midline Location: L3-4 Injection technique: single-shot Needle Needle type: Pencan  Needle gauge: 24 G Needle length: 10 cm Assessment Sensory level: T4 Events: CSF return and second provider Additional Notes Checked spinal kit and duraprep expiration dates; full monitor; O2 via FM; sterile technique; anesthetized skinwith 1 % lido; Clear CSF seen prior to injection; swirl during injection. Pt tolerated well.

## 2021-05-07 NOTE — Anesthesia Postprocedure Evaluation (Signed)
Anesthesia Post Note  Patient: [name]  Procedure(s) Performed: TOTAL HIP ARTHROPLASTY ANTERIOR APPROACH (Right: Hip)     Patient location during evaluation: PACU Anesthesia Type: MAC and Spinal Level of consciousness: awake and alert and oriented Pain management: pain level controlled Vital Signs Assessment: post-procedure vital signs reviewed and stable Respiratory status: spontaneous breathing, nonlabored ventilation and respiratory function stable Cardiovascular status: blood pressure returned to baseline and stable Postop Assessment: no headache, no backache, spinal receding and patient able to bend at knees Anesthetic complications: no   No notable events documented.  Last Vitals:  Vitals:   05/07/21 0925  BP: 134/90  Pulse: 69  Resp: 16  Temp: 36.7 C  SpO2: 100%    Last Pain:  Vitals:   05/07/21 0932  TempSrc:   PainSc: [place]

## 2021-05-07 NOTE — Anesthesia Preprocedure Evaluation (Addendum)
Anesthesia Evaluation  Patient identified by MRN, date of birth, ID band Patient awake    Reviewed: Allergy & Precautions, NPO status , Patient's Chart, lab work & pertinent test results, reviewed documented beta blocker date and time   Airway Mallampati: II  TM Distance: >3 FB Neck ROM: Full    Dental no notable dental hx.    Pulmonary neg pulmonary ROS,    Pulmonary exam normal breath sounds clear to auscultation       Cardiovascular hypertension, Pt. on medications and Pt. on home beta blockers Normal cardiovascular exam Rhythm:Regular Rate:Normal  Echo 09/2020: 1. Left ventricular ejection fraction, by estimation, is 60 to 65%. The  left ventricle has normal function. The left ventricle has no regional  wall motion abnormalities. Left ventricular diastolic parameters are  indeterminate.  2. Right ventricular systolic function is normal. The right ventricular  size is normal. There is normal pulmonary artery systolic pressure.  3. The mitral valve is normal in structure. No evidence of mitral valve  regurgitation. No evidence of mitral stenosis.  4. The aortic valve is normal in structure. Aortic valve regurgitation is  not visualized. No aortic stenosis is present.    Neuro/Psych negative neurological ROS  negative psych ROS   GI/Hepatic Neg liver ROS, GERD  Medicated and Controlled,  Endo/Other  negative endocrine ROS  Renal/GU negative Renal ROS Bladder dysfunction      Musculoskeletal  (+) Arthritis , Osteoarthritis,  One prior lumbar surgery 2009- per pt L3-4   Abdominal   Peds negative pediatric ROS (+)  Hematology negative hematology ROS (+) hct 42.1, plt 224   Anesthesia Other Findings L eye blindness  Reproductive/Obstetrics negative OB ROS                            Anesthesia Physical Anesthesia Plan  ASA: 2  Anesthesia Plan: Spinal and MAC   Post-op Pain  Management:    Induction:   PONV Risk Score and Plan: 2 and Propofol infusion and TIVA  Airway Management Planned: Natural Airway and Nasal Cannula  Additional Equipment: None  Intra-op Plan:   Post-operative Plan:   Informed Consent: I have reviewed the patients History and Physical, chart, labs and discussed the procedure including the risks, benefits and alternatives for the proposed anesthesia with the patient or authorized representative who has indicated his/her understanding and acceptance.       Plan Discussed with: CRNA  Anesthesia Plan Comments:        Anesthesia Quick Evaluation

## 2021-05-07 NOTE — Evaluation (Signed)
Physical Therapy Evaluation Patient Details Name: [name] MRN: [id number] DOB: Jan 14, 1946 Today's Date: 05/07/2021  History of Present Illness  pt s/p R DATHA 05/07/2021 with PMH of L THA 2019, and LTKA 2017.  Clinical Impression  Pt tolerated POD0 session well, ambulated in room and sat in recliner. Begin education and performing HEP, and safety with RW . Will need another session for continued education with HEP, gait and stair training       Recommendations for follow up therapy are one component of a multi-disciplinary discharge planning process, led by the attending physician.  Recommendations may be updated based on patient status, additional functional criteria and insurance authorization.  Follow Up Recommendations Follow physician's recommendations for discharge plan and follow up therapies    Assistance Recommended at Discharge Intermittent Supervision/Assistance  Functional Status Assessment Patient has had a recent decline in their functional status and demonstrates the ability to make significant improvements in function in a reasonable and predictable amount of time.  Equipment Recommendations  None recommended by PT    Recommendations for Other Services       Precautions / Restrictions Precautions Precautions: None Restrictions Weight Bearing Restrictions: No      Mobility  Bed Mobility Overal bed mobility: Needs Assistance Bed Mobility: Supine to Sit     Supine to sit: Min guard          Transfers Overall transfer level: Needs assistance Equipment used: Rolling walker (2 wheels) Transfers: Sit to/from Stand Sit to Stand: Min assist           General transfer comment: cues for hand placement and safety with RW , little assist to boost    Ambulation/Gait Ambulation/Gait assistance: Min assist Gait Distance (Feet): 20 Feet Assistive device: Rolling walker (2 wheels) Gait Pattern/deviations: Step-to pattern     General Gait Details:  toleated well, ambulated in room with RW , step to pattern due to anthesia wearing off  Stairs            Wheelchair Mobility    Modified Rankin (Stroke Patients Only)       Balance Overall balance assessment: Needs assistance Sitting-balance support: Feet supported Sitting balance-Leahy Scale: Good     Standing balance support: Bilateral upper extremity supported;During functional activity Standing balance-Leahy Scale: Fair                               Pertinent Vitals/Pain Pain Assessment: 0-10 Pain Score: 3  Pain Location: R hip and thigh area Pain Descriptors / Indicators: Aching;Sore Pain Intervention(s): Limited activity within patient's tolerance;Monitored during session    [place] expects to be discharged to:: Private residence Living Arrangements: Spouse/significant other Available Help at Discharge: Family Type of Home: House Home Access: Stairs to enter Entrance Stairs-Rails:  (pt is unsure if rails, will ask husband) Technical brewer of Steps: 2   Home Layout: One [place]: Conservation officer, nature (2 wheels);Cane - single point      Prior Function Prior Level of Function : Independent/Modified Independent             Mobility Comments: Loves being active and can't wait to be able to get around better       Hand Dominance        Extremity/Trunk Assessment        Lower Extremity Assessment Lower Extremity Assessment: Overall WFL for tasks assessed       Communication  Communication: No difficulties  Cognition Arousal/Alertness: Awake/alert Behavior During Therapy: WFL for tasks assessed/performed Overall Cognitive Status: Within Functional Limits for tasks assessed                                          General Comments      Exercises Total Joint Exercises Ankle Circles/Pumps: AROM;Both;10 reps;Supine Quad Sets: AROM;Right;5 reps;Supine Heel Slides:  AAROM;Supine;Right;5 reps Straight Leg Raises: AAROM;Supine;Right;5 reps   Assessment/Plan    PT Assessment Patient needs continued PT services  PT Problem List Decreased strength;Decreased range of motion;Decreased activity tolerance;Decreased mobility       PT Treatment Interventions DME instruction;Gait training;Stair training;Functional mobility training;Therapeutic activities;Therapeutic exercise;Patient/family education    PT Goals (Current goals can be found in the Care Plan section)  Acute Rehab PT Goals Patient Stated Goal: Iw ant to be able to get around better and keep moving PT Goal Formulation: With patient Time For Goal Achievement: 05/07/21 Potential to Achieve Goals: Good    Frequency 7X/week   Barriers to discharge        Co-evaluation               AM-PAC PT "6 Clicks" Mobility  Outcome Measure Help needed turning from your back to your side while in a flat bed without using bedrails?: None Help needed moving from lying on your back to sitting on the side of a flat bed without using bedrails?: A Little Help needed moving to and from a bed to a chair (including a wheelchair)?: A Little Help needed standing up from a chair using your arms (e.g., wheelchair or bedside chair)?: A Little Help needed to walk in hospital room?: A Little Help needed climbing 3-5 steps with a railing? : A Little 6 Click Score: 19    End of Session Equipment Utilized During Treatment: Gait belt Activity Tolerance: Patient tolerated treatment well Patient left: in chair;with call bell/phone within reach Nurse Communication: Mobility status PT Visit Diagnosis: Other abnormalities of gait and mobility (R26.89)    Time: 4742-5956 PT Time Calculation (min) (ACUTE ONLY): 35 min   Charges:   PT Evaluation $PT Eval Low Complexity: 1 Low PT Treatments $Gait Training: 8-22 mins $Therapeutic Exercise: 8-22 mins        Jewett Mcgann, PT, MPT Acute Rehabilitation  Services Office: [phone] Pager: [phone] 05/07/2021   [name] 05/07/2021, 8:19 PM

## 2021-05-08 ENCOUNTER — Encounter (HOSPITAL_COMMUNITY): Payer: Self-pay | Admitting: Orthopedic Surgery

## 2021-05-08 DIAGNOSIS — I10 Essential (primary) hypertension: Secondary | ICD-10-CM | POA: Diagnosis not present

## 2021-05-08 DIAGNOSIS — Z7982 Long term (current) use of aspirin: Secondary | ICD-10-CM | POA: Diagnosis not present

## 2021-05-08 DIAGNOSIS — Z96652 Presence of left artificial knee joint: Secondary | ICD-10-CM | POA: Diagnosis not present

## 2021-05-08 DIAGNOSIS — Z96642 Presence of left artificial hip joint: Secondary | ICD-10-CM | POA: Diagnosis not present

## 2021-05-08 DIAGNOSIS — M1611 Unilateral primary osteoarthritis, right hip: Secondary | ICD-10-CM | POA: Diagnosis not present

## 2021-05-08 DIAGNOSIS — Z79899 Other long term (current) drug therapy: Secondary | ICD-10-CM | POA: Diagnosis not present

## 2021-05-08 LAB — CBC
HCT: 32.6 % — ABNORMAL LOW (ref 36.0–46.0)
Hemoglobin: 10.7 g/dL — ABNORMAL LOW (ref 12.0–15.0)
MCH: 32.2 pg (ref 26.0–34.0)
MCHC: 32.8 g/dL (ref 30.0–36.0)
MCV: 98.2 fL (ref 80.0–100.0)
Platelets: 188 10*3/uL (ref 150–400)
RBC: 3.32 MIL/uL — ABNORMAL LOW (ref 3.87–5.11)
RDW: 12.6 % (ref 11.5–15.5)
WBC: 14.9 10*3/uL — ABNORMAL HIGH (ref 4.0–10.5)
nRBC: 0 % (ref 0.0–0.2)

## 2021-05-08 LAB — BASIC METABOLIC PANEL WITH GFR
Anion gap: 6 (ref 5–15)
BUN: 8 mg/dL (ref 8–23)
CO2: 25 mmol/L (ref 22–32)
Calcium: 8.4 mg/dL — ABNORMAL LOW (ref 8.9–10.3)
Chloride: 106 mmol/L (ref 98–111)
Creatinine, Ser: 0.57 mg/dL (ref 0.44–1.00)
GFR, Estimated: >60 mL/min
Glucose, Bld: 138 mg/dL — ABNORMAL HIGH (ref 70–99)
Potassium: 4 mmol/L (ref 3.5–5.1)
Sodium: 137 mmol/L (ref 135–145)

## 2021-05-08 MED ORDER — TRAMADOL HCL 50 MG PO TABS: 50.0000 mg | ORAL_TABLET | Freq: Four times a day (QID) | ORAL | 0 refills | Status: DC | PRN

## 2021-05-08 MED ORDER — OXYCODONE HCL 5 MG PO TABS: 5.0000 mg | ORAL_TABLET | Freq: Four times a day (QID) | ORAL | 0 refills | Status: DC | PRN

## 2021-05-08 MED ORDER — ASPIRIN 325 MG PO TBEC: 325.0000 mg | DELAYED_RELEASE_TABLET | Freq: Two times a day (BID) | ORAL | 0 refills | Status: AC

## 2021-05-08 MED ORDER — METHOCARBAMOL 500 MG PO TABS: 500.0000 mg | ORAL_TABLET | Freq: Four times a day (QID) | ORAL | 0 refills | Status: DC | PRN

## 2021-05-08 NOTE — Progress Notes (Signed)
   Subjective: 1 Day Post-Op Procedure(s) (LRB): TOTAL HIP ARTHROPLASTY ANTERIOR APPROACH (Right) Patient reports pain as mild.   Patient seen in rounds by Dr. Wynelle Link. Patient is doing well overall, but has mild complaints of some abdomen tightness/distention. Positive flatus. Denies nausea. Foley catheter removed this AM. We will continue therapy today.   Objective: Vital signs in last 24 hours: Temp:  [97.8 F (36.6 C)-98.3 F (36.8 C)] 98.3 F (36.8 C) (10/25 0509) Pulse Rate:  [60-81] 73 (10/25 0509) Resp:  [9-20] 16 (10/25 0509) BP: (81-138)/(50-90) 122/63 (10/25 0509) SpO2:  [98 %-100 %] 99 % (10/25 0509) Weight:  [61.7 kg] 61.7 kg (10/24 0932)  Intake/Output from previous day:  Intake/Output Summary (Last 24 hours) at 05/08/2021 0816 Last data filed at 05/08/2021 0807 Gross per 24 hour  Intake 3923.07 ml  Output 4075 ml  Net -151.93 ml     Intake/Output this shift: Total I/O In: 100 [P.O.:100] Out: -   Labs: Recent Labs    05/08/21 0334  HGB 10.7*   Recent Labs    05/08/21 0334  WBC 14.9*  RBC 3.32*  HCT 32.6*  PLT 188   Recent Labs    05/08/21 0334  NA 137  K 4.0  CL 106  CO2 25  BUN 8  CREATININE 0.57  GLUCOSE 138*  CALCIUM 8.4*   No results for input(s): LABPT, INR in the last 72 hours.  Exam: General - Patient is Alert and Oriented Extremity - Neurologically intact Neurovascular intact Sensation intact distally Dorsiflexion/Plantar flexion intact Abdomen soft, but slightly distended. Dressing - dressing C/D/I Motor Function - intact, moving foot and toes well on exam.   Past Medical History:  Diagnosis Date   Allergy    Arthritis    oa   Cataract of both eyes    Cervical dysplasia    Diarrhea    off and on last 3 months 1 episode today   Dry eyes    Fluid retention    slight leg swelling at times   GERD (gastroesophageal reflux disease)    Hypertension    Legally blind in left eye, as defined in [place]    since birth     Low back pain    Lumbar herniated disc    Numbness and tingling    feet bilat    Osteopenia 06/2015   T score -1.7   Syncope    none recent   Urinary frequency    Urinary hesitancy    Wears glasses     Assessment/Plan: 1 Day Post-Op Procedure(s) (LRB): TOTAL HIP ARTHROPLASTY ANTERIOR APPROACH (Right) Principal Problem:   OA (osteoarthritis) of hip Active Problems:   Primary osteoarthritis of right hip  Estimated body mass index is 23.34 kg/m as calculated from the following:   Height as of this encounter: 5\' 4"  (1.626 m).   Weight as of this encounter: 61.7 kg. Advance diet Up with therapy  DVT Prophylaxis - Aspirin Weight bearing as tolerated. Continue therapy.  Will continue to monitor patient's abdominal complaint. Low suspicion for ileus, patient is not nausea and passing gas.    Plan is to go Home after hospital stay. If feeling well this afternoon and meeting goals with therapy, can dc to home with HEP. Follow-up in the office in 2 weeks.  The PDMP database was reviewed today prior to any opioid medications being prescribed to this patient.  Theresa Duty, PA-C Orthopedic Surgery [phone] 05/08/2021, 8:16 AM

## 2021-05-08 NOTE — Progress Notes (Signed)
Physical Therapy Treatment Patient Details Name: [name] MRN: [id number] DOB: 03-Nov-1945 Today's Date: 05/08/2021   History of Present Illness pt s/p R DATHA 05/07/2021 with PMH of L THA 2019, and LTKA 2017.    PT Comments    Pt is progressing well with mobility. She ambulated 120' with RW, no loss of balance. Stair training completed. She demonstrates good understanding of HEP. She is ready to DC home from a PT standpoint.    Recommendations for follow up therapy are one component of a multi-disciplinary discharge planning process, led by the attending physician.  Recommendations may be updated based on patient status, additional functional criteria and insurance authorization.  Follow Up Recommendations  Follow physician's recommendations for discharge plan and follow up therapies     Assistance Recommended at Discharge Intermittent Supervision/Assistance  Equipment Recommendations  None recommended by PT    Recommendations for Other Services       Precautions / Restrictions Precautions Precautions: None Restrictions Weight Bearing Restrictions: No     Mobility  Bed Mobility Overal bed mobility: Needs Assistance Bed Mobility: Supine to Sit     Supine to sit: Supervision     General bed mobility comments: used gait belt as leg lifter    Transfers Overall transfer level: Needs assistance Equipment used: Rolling walker (2 wheels) Transfers: Sit to/from Stand Sit to Stand: Supervision           General transfer comment: cues for hand placement and safety with RW    Ambulation/Gait Ambulation/Gait assistance: Supervision Gait Distance (Feet): 120 Feet Assistive device: Rolling walker (2 wheels) Gait Pattern/deviations: Step-to pattern Gait velocity: wfl   General Gait Details: toleated well, no loss of balance, good sequencing   Stairs Stairs: Yes Stairs assistance: Min assist Stair Management: No rails;Backwards;Step to pattern;With  walker Number of Stairs: 2 General stair comments: min A to manage RW, VCs sequencing   Wheelchair Mobility    Modified Rankin (Stroke Patients Only)       Balance Overall balance assessment: Needs assistance Sitting-balance support: Feet supported Sitting balance-Leahy Scale: Good     Standing balance support: Bilateral upper extremity supported;During functional activity Standing balance-Leahy Scale: Fair                              Cognition Arousal/Alertness: Awake/alert Behavior During Therapy: WFL for tasks assessed/performed Overall Cognitive Status: Within Functional Limits for tasks assessed                                          Exercises Total Joint Exercises Ankle Circles/Pumps: AROM;Both;10 reps;Supine Quad Sets: AROM;5 reps;Supine;Both Short Arc Quad: AROM;Right;10 reps;Supine Heel Slides: AAROM;Supine;Right;5 reps Long Arc Quad: AROM;Right;10 reps;Seated    General Comments        Pertinent Vitals/Pain Pain Score: 3  Pain Location: R hip and thigh area Pain Descriptors / Indicators: Aching;Sore Pain Intervention(s): Limited activity within patient's tolerance;Monitored during session;Premedicated before session;Ice applied    Home Living                          Prior Function            PT Goals (current goals can now be found in the care plan section) Acute Rehab PT Goals Patient Stated Goal: to be able to get around  better and keep moving; do housework PT Goal Formulation: With patient Time For Goal Achievement: 05/07/21 Potential to Achieve Goals: Good Progress towards PT goals: Progressing toward goals    Frequency    7X/week      PT Plan Current plan remains appropriate    Co-evaluation              AM-PAC PT "6 Clicks" Mobility   Outcome Measure  Help needed turning from your back to your side while in a flat bed without using bedrails?: None Help needed moving from  lying on your back to sitting on the side of a flat bed without using bedrails?: A Little Help needed moving to and from a bed to a chair (including a wheelchair)?: A Little Help needed standing up from a chair using your arms (e.g., wheelchair or bedside chair)?: A Little Help needed to walk in hospital room?: A Little Help needed climbing 3-5 steps with a railing? : A Little 6 Click Score: 19    End of Session Equipment Utilized During Treatment: Gait belt Activity Tolerance: Patient tolerated treatment well Patient left: in chair;with call bell/phone within reach;with chair alarm set Nurse Communication: Mobility status PT Visit Diagnosis: Other abnormalities of gait and mobility (R26.89)     Time: 2841-3244 PT Time Calculation (min) (ACUTE ONLY): 35 min  Charges:  $Gait Training: 8-22 mins $Therapeutic Exercise: 8-22 mins                     Blondell Reveal Kistler PT 05/08/2021  Acute Rehabilitation Services Pager [phone] Office [phone]

## 2021-05-08 NOTE — TOC Transition Note (Signed)
Transition of Care [facility]) - CM/SW Discharge Note  Patient Details  Name: [name] MRN: [id number] Date of Birth: [birth year]  [facility] CM/SW Contact:  Sherie Don, LCSW Phone Number: 05/08/2021, 11:07 AM  Clinical Narrative: Patient is expected to discharge home after working with PT. CSW met with patient to confirm discharge plan. Patient will discharge home with a home exercise program (HEP). Patient has a rolling walker, cane, wheelchair, elevated toilets, and 3N1 at home so there are no DME needs at this time. TOC signing off.  Final next level of care: OP Rehab Barriers to Discharge: No Barriers Identified  Patient Goals and CMS Choice Patient states their goals for this hospitalization and ongoing recovery are:: Discharge home with [place] CMS Medicare.gov Compare Post Acute Care list provided to:: Patient Choice offered to / list presented to : NA  Discharge Plan and Services         DME Arranged: N/A DME Agency: NA  Readmission Risk Interventions No flowsheet data found.

## 2021-05-14 NOTE — Discharge Summary (Signed)
Physician Discharge Summary   Patient ID: [name] MRN: [id number] DOB/AGE: 09/07/1945 75 y.o.  Admit date: 05/07/2021 Discharge date: 05/08/2021  Primary Diagnosis: Osteoarthritis, right hip   Admission Diagnoses:  Past Medical History:  Diagnosis Date   Allergy    Arthritis    oa   Cataract of both eyes    Cervical dysplasia    Diarrhea    off and on last 3 months 1 episode today   Dry eyes    Fluid retention    slight leg swelling at times   GERD (gastroesophageal reflux disease)    Hypertension    Legally blind in left eye, as defined in [place]    since birth    Low back pain    Lumbar herniated disc    Numbness and tingling    feet bilat    Osteopenia 06/2015   T score -1.7   Syncope    none recent   Urinary frequency    Urinary hesitancy    Wears glasses    Discharge Diagnoses:   Principal Problem:   OA (osteoarthritis) of hip Active Problems:   Primary osteoarthritis of right hip  Estimated body mass index is 23.34 kg/m as calculated from the following:   Height as of this encounter: 5\' 4"  (1.626 m).   Weight as of this encounter: 61.7 kg.  Procedure:  Procedure(s) (LRB): TOTAL HIP ARTHROPLASTY ANTERIOR APPROACH (Right)   Consults: None  HPI:  [name] is a 75 y.o. female who has advanced end-  stage arthritis of their Right  hip with progressively worsening pain and dysfunction.The patient has failed nonoperative management and presents for total hip arthroplasty.   Laboratory Data: Admission on 05/07/2021, Discharged on 05/08/2021  Component Date Value Ref Range Status   WBC 05/08/2021 14.9 (A)  4.0 - 10.5 K/uL Final   RBC 05/08/2021 3.32 (A)  3.87 - 5.11 MIL/uL Final   Hemoglobin 05/08/2021 10.7 (A)  12.0 - 15.0 g/dL Final   HCT 05/08/2021 32.6 (A)  36.0 - 46.0 % Final   MCV 05/08/2021 98.2  80.0 - 100.0 fL Final   MCH 05/08/2021 32.2  26.0 - 34.0 pg Final   MCHC 05/08/2021 32.8  30.0 - 36.0 g/dL Final   RDW 05/08/2021 12.6   11.5 - 15.5 % Final   Platelets 05/08/2021 188  150 - 400 K/uL Final   nRBC 05/08/2021 0.0  0.0 - 0.2 % Final   Performed at [facility], [place] [address] [place], [place] [address]   Sodium 05/08/2021 137  135 - 145 mmol/L Final   Potassium 05/08/2021 4.0  3.5 - 5.1 mmol/L Final   Chloride 05/08/2021 106  98 - 111 mmol/L Final   CO2 05/08/2021 25  22 - 32 mmol/L Final   Glucose, Bld 05/08/2021 138 (A)  70 - 99 mg/dL Final   Glucose reference range applies only to samples taken after fasting for at least 8 hours.   BUN 05/08/2021 8  8 - 23 mg/dL Final   Creatinine, Ser 05/08/2021 0.57  0.44 - 1.00 mg/dL Final   Calcium 05/08/2021 8.4 (A)  8.9 - 10.3 mg/dL Final   GFR, Estimated 05/08/2021 >60  >60 mL/min Final   Comment: (NOTE) Calculated using the CKD-EPI Creatinine Equation (2021)    Anion gap 05/08/2021 6  5 - 15 Final   Performed at [facility], [place] [address], [place], [place] [address]  Orders Only on 05/03/2021  Component  Date Value Ref Range Status   SARS Coronavirus 2 05/03/2021 RESULT: NEGATIVE   Final   Comment: RESULT: NEGATIVESARS-CoV-2 INTERPRETATION:A NEGATIVE  test result means that SARS-CoV-2 RNA was not present in the specimen above the limit of detection of this test. This does not preclude a possible SARS-CoV-2 infection and should not be used as the  sole basis for patient management decisions. Negative results must be combined with clinical observations, patient history, and epidemiological information. Optimum specimen types and timing for peak viral levels during infections caused by SARS-CoV-2  have not been determined. Collection of multiple specimens or types of specimens may be necessary to detect virus. Improper specimen collection and handling, sequence variability under primers/probes, or organism present below the limit of detection may  lead to false negative results. Positive and negative predictive values of testing  are highly dependent on prevalence. False negative test results are more likely when prevalence of disease is high.The expected result is NEGATIVE.Fact S                          heet for  Healthcare Providers: LocalChronicle.no Sheet for Patients: SalonLookup.es Reference Range - Negative   Hospital Outpatient Visit on 04/24/2021  Component Date Value Ref Range Status   MRSA, PCR 04/24/2021 NEGATIVE  NEGATIVE Final   Staphylococcus aureus 04/24/2021 NEGATIVE  NEGATIVE Final   Comment: (NOTE) The Xpert SA Assay (FDA approved for NASAL specimens in patients 18 years of age and older), is one component of a comprehensive surveillance program. It is not intended to diagnose infection nor to guide or monitor treatment. Performed at [facility], [place] [address], [place], [place] [address]    WBC 04/24/2021 7.0  4.0 - 10.5 K/uL Final   RBC 04/24/2021 4.34  3.87 - 5.11 MIL/uL Final   Hemoglobin 04/24/2021 14.0  12.0 - 15.0 g/dL Final   HCT 04/24/2021 42.1  36.0 - 46.0 % Final   MCV 04/24/2021 97.0  80.0 - 100.0 fL Final   MCH 04/24/2021 32.3  26.0 - 34.0 pg Final   MCHC 04/24/2021 33.3  30.0 - 36.0 g/dL Final   RDW 04/24/2021 12.8  11.5 - 15.5 % Final   Platelets 04/24/2021 224  150 - 400 K/uL Final   nRBC 04/24/2021 0.0  0.0 - 0.2 % Final   Performed at [facility], [place] [address] [place], [place] [address]   Sodium 04/24/2021 138  135 - 145 mmol/L Final   Potassium 04/24/2021 4.2  3.5 - 5.1 mmol/L Final   Chloride 04/24/2021 105  98 - 111 mmol/L Final   CO2 04/24/2021 30  22 - 32 mmol/L Final   Glucose, Bld 04/24/2021 67 (A)  70 - 99 mg/dL Final   Glucose reference range applies only to samples taken after fasting for at least 8 hours.   BUN 04/24/2021 16  8 - 23 mg/dL Final   Creatinine, Ser 04/24/2021 0.63  0.44 - 1.00 mg/dL Final   Calcium 04/24/2021 9.6  8.9 - 10.3 mg/dL Final    Total Protein 04/24/2021 6.9  6.5 - 8.1 g/dL Final   Albumin 04/24/2021 4.1  3.5 - 5.0 g/dL Final   AST 04/24/2021 20  15 - 41 U/L Final   ALT 04/24/2021 16  0 - 44 U/L Final   Alkaline Phosphatase 04/24/2021 60  38 - 126 U/L Final   Total Bilirubin 04/24/2021 0.8  0.3 - 1.2 mg/dL Final   GFR,  Estimated 04/24/2021 >60  >60 mL/min Final   Comment: (NOTE) Calculated using the CKD-EPI Creatinine Equation (2021)    Anion gap 04/24/2021 3 (A)  5 - 15 Final   Performed at [facility] [place] [address] [place], [place] [address]   ABO/RH(D) 04/24/2021 O POS   Final   Antibody Screen 04/24/2021 NEG   Final   Sample Expiration 04/24/2021 05/10/2021,2359   Final   Extend sample reason 04/24/2021    Final                   Value:NO TRANSFUSIONS OR PREGNANCY IN THE PAST 3 MONTHS Performed at [facility] [address] [place], [place] [address]    Prothrombin Time 04/24/2021 12.8  11.4 - 15.2 seconds Final   INR 04/24/2021 1.0  0.8 - 1.2 Final   Comment: (NOTE) INR goal varies based on device and disease states. Performed at [facility], [place] [address], [place], [place] [address]      X-Rays:DG Pelvis Portable  Result Date: 05/07/2021 CLINICAL DATA:  Hip replacement EXAM: PORTABLE PELVIS 1-2 VIEWS COMPARISON:  Portable exam 1406 hours compared to earlier intraoperative images of 05/07/2021 and a prior study of 03/25/2018 FINDINGS: Osseous demineralization. Old LEFT hip prosthesis. Components of a RIGHT hip prosthesis are newly identified without fracture or dislocation. No focal osseous abnormality seen. IMPRESSION: New RIGHT hip prosthesis without acute osseous abnormalities. Old LEFT hip prosthesis. Electronically Signed   By: Lavonia Dana M.D.   On: 05/07/2021 15:15   DG C-Arm 1-60 Min-No Report  Result Date: 05/07/2021 Fluoroscopy was utilized by the requesting physician.  No radiographic interpretation.   DG C-Arm 1-60 Min-No  Report  Result Date: 05/07/2021 Fluoroscopy was utilized by the requesting physician.  No radiographic interpretation.   DG HIP OPERATIVE UNILAT W OR W/O PELVIS RIGHT  Result Date: 05/07/2021 CLINICAL DATA:  Right hip replacement EXAM: OPERATIVE RIGHT HIP (WITH PELVIS IF PERFORMED) 1 VIEW TECHNIQUE: Fluoroscopic spot image(s) were submitted for interpretation post-operatively. COMPARISON:  None. FINDINGS: Intraoperative frontal images of the right hip demonstrate placement of right total hip arthroplasty. IMPRESSION: Fluoroscopic guidance for right total hip arthroplasty. Electronically Signed   By: Macy Mis M.D.   On: 05/07/2021 13:29    EKG: Orders placed or performed in visit on 02/01/21   EKG 12-Lead     Hospital Course: [name] is a 75 y.o. who was admitted to [facility]. They were brought to the operating room on 05/07/2021 and underwent Procedure(s): TOTAL HIP ARTHROPLASTY ANTERIOR APPROACH.  Patient tolerated the procedure well and was later transferred to the recovery room and then to the orthopaedic floor for postoperative care. They were given PO and IV analgesics for pain control following their surgery. They were given 24 hours of postoperative antibiotics of  Anti-infectives (From admission, onward)    Start     Dose/Rate Route Frequency Ordered Stop   05/07/21 1800  ceFAZolin (ANCEF) IVPB 2g/100 mL premix        2 g 200 mL/hr over 30 Minutes Intravenous Every 6 hours 05/07/21 1510 05/08/21 0025   05/07/21 0927  ceFAZolin (ANCEF) 2-4 GM/100ML-% IVPB       Note to Pharmacy: Randa Evens  : cabinet override      05/07/21 0927 05/07/21 1231   05/07/21 0915  ceFAZolin (ANCEF) IVPB 2g/100 mL premix        2 g 200 mL/hr over 30 Minutes Intravenous On  call to O.R. 05/07/21 0914 05/07/21 1159      and started on DVT prophylaxis in the form of Aspirin.   PT and OT were ordered for total joint protocol. Discharge planning consulted to help with postop  disposition and equipment needs.  Patient had a good night on the evening of surgery. They started to get up OOB with therapy on POD #0. Pt was seen during rounds and was ready to go home pending progress with therapy. Had some mild complaints of abdominal discomfort, but this improved throughout the day without any action. She worked with therapy on POD #1 and was meeting her goals. Pt was discharged to home later that day in stable condition.  Diet: Regular diet Activity: WBAT Follow-up: in 2 weeks Disposition: Home with HEP Discharged Condition: stable   Discharge Instructions     Call MD / Call 911   Complete by: As directed    If you experience chest pain or shortness of breath, CALL 911 and be transported to the hospital emergency room.  If you develope a fever above 101 F, pus (white drainage) or increased drainage or redness at the wound, or calf pain, call your surgeon's office.   Change dressing   Complete by: As directed    You have an adhesive waterproof bandage over the incision. Leave this in place until your first follow-up appointment. Once you remove this you will not need to place another bandage.   Constipation Prevention   Complete by: As directed    Drink plenty of fluids.  Prune juice may be helpful.  You may use a stool softener, such as Colace (over the counter) 100 mg twice a day.  Use MiraLax (over the counter) for constipation as needed.   Diet - low sodium heart healthy   Complete by: As directed    Do not sit on low chairs, stoools or toilet seats, as it may be difficult to get up from low surfaces   Complete by: As directed    Driving restrictions   Complete by: As directed    No driving for two weeks   Post-operative opioid taper instructions:   Complete by: As directed    POST-OPERATIVE OPIOID TAPER INSTRUCTIONS: It is important to wean off of your opioid medication as soon as possible. If you do not need pain medication after your surgery it is ok to  stop day one. Opioids include: Codeine, Hydrocodone(Norco, Vicodin), Oxycodone(Percocet, oxycontin) and hydromorphone amongst others.  Long term and even short term use of opiods can cause: Increased pain response Dependence Constipation Depression Respiratory depression And more.  Withdrawal symptoms can include Flu like symptoms Nausea, vomiting And more Techniques to manage these symptoms Hydrate well Eat regular healthy meals Stay active Use relaxation techniques(deep breathing, meditating, yoga) Do Not substitute Alcohol to help with tapering If you have been on opioids for less than two weeks and do not have pain than it is ok to stop all together.  Plan to wean off of opioids This plan should start within one week post op of your joint replacement. Maintain the same interval or time between taking each dose and first decrease the dose.  Cut the total daily intake of opioids by one tablet each day Next start to increase the time between doses. The last dose that should be eliminated is the evening dose.      TED hose   Complete by: As directed    Use stockings (TED hose) for three weeks  on both leg(s).  You may remove them at night for sleeping.   Weight bearing as tolerated   Complete by: As directed       Allergies as of 05/08/2021   No Known Allergies      Medication List     TAKE these medications    ALPRAZolam 0.25 MG tablet Commonly known as: XANAX Take 0.25 mg by mouth 2 (two) times daily as needed for anxiety.   aspirin 325 MG EC tablet Take 1 tablet (325 mg total) by mouth 2 (two) times daily for 20 days. Then resume one 81 mg aspirin once a day. What changed:  medication strength how much to take when to take this additional instructions   atorvastatin 20 MG tablet Commonly known as: LIPITOR Take 1 tablet (20 mg total) by mouth daily. What changed: when to take this   CALCIUM 600+D3 PO Take 1 tablet by mouth 2 (two) times daily.    estradiol 0.5 MG tablet Commonly known as: ESTRACE Take 0.5 mg by mouth at bedtime.   Fish Oil 1000 MG Caps Take 1,000 mg by mouth 2 (two) times daily.   methocarbamol 500 MG tablet Commonly known as: ROBAXIN Take 1 tablet (500 mg total) by mouth every 6 (six) hours as needed for muscle spasms.   metoprolol succinate 25 MG 24 hr tablet Commonly known as: Toprol XL Take 0.5 tablets (12.5 mg total) by mouth daily.   multivitamin with minerals Tabs tablet Take 1 tablet by mouth daily. Senior Multivitamin   oxyCODONE 5 MG immediate release tablet Commonly known as: Oxy IR/ROXICODONE Take 1-2 tablets (5-10 mg total) by mouth every 6 (six) hours as needed for severe pain. Not to exceed 6 tablets a day.   pantoprazole 40 MG tablet Commonly known as: PROTONIX Take 40 mg by mouth daily.   temazepam 15 MG capsule Commonly known as: RESTORIL Take 15 mg by mouth at bedtime.   traMADol 50 MG tablet Commonly known as: ULTRAM Take 1-2 tablets (50-100 mg total) by mouth every 6 (six) hours as needed for moderate pain.   vitamin C 500 MG tablet Commonly known as: ASCORBIC ACID Take 500 mg by mouth daily.   Vitamin D3 50 MCG (2000 UT) capsule Take 2,000 Units by mouth every evening.   zinc gluconate 50 MG tablet Take 50 mg by mouth daily.               Discharge Care Instructions  (From admission, onward)           Start     Ordered   05/08/21 0000  Weight bearing as tolerated        05/08/21 0820   05/08/21 0000  Change dressing       Comments: You have an adhesive waterproof bandage over the incision. Leave this in place until your first follow-up appointment. Once you remove this you will not need to place another bandage.   05/08/21 0820            Follow-up Information     Gaynelle Arabian, MD Follow up in 2 week(s).   Specialty: Orthopedic Surgery Contact information: [address] [place] [address] [phone]                  Signed: Theresa Duty, PA-C Orthopedic Surgery 05/14/2021, 7:40 AM

## 2021-06-12 ENCOUNTER — Ambulatory Visit: Payer: Medicare Other | Admitting: Cardiology

## 2021-06-12 DIAGNOSIS — Z471 Aftercare following joint replacement surgery: Secondary | ICD-10-CM | POA: Diagnosis not present

## 2021-06-12 DIAGNOSIS — Z4789 Encounter for other orthopedic aftercare: Secondary | ICD-10-CM | POA: Diagnosis not present

## 2021-07-13 ENCOUNTER — Other Ambulatory Visit: Payer: Self-pay

## 2021-07-13 ENCOUNTER — Ambulatory Visit (INDEPENDENT_AMBULATORY_CARE_PROVIDER_SITE_OTHER)
Admission: RE | Admit: 2021-07-13 | Discharge: 2021-07-13 | Disposition: A | Discharge status: Home / Self Care | Payer: Medicare Other | Source: Ambulatory Visit | Attending: Cardiology | Admitting: Cardiology

## 2021-07-13 DIAGNOSIS — R911 Solitary pulmonary nodule: Secondary | ICD-10-CM | POA: Diagnosis not present

## 2021-07-13 DIAGNOSIS — R918 Other nonspecific abnormal finding of lung field: Secondary | ICD-10-CM | POA: Diagnosis not present

## 2021-07-18 ENCOUNTER — Telehealth: Payer: Self-pay

## 2021-07-18 DIAGNOSIS — R918 Other nonspecific abnormal finding of lung field: Secondary | ICD-10-CM

## 2021-07-18 NOTE — Telephone Encounter (Signed)
Patient called in, wanting to get her CT results.  I advised patient of results, we will reorder chest ct for 6 months.  Patient thankful for call back.

## 2021-08-28 NOTE — Progress Notes (Signed)
Cardiology Office Note:    Date:  09/03/2021   ID:  [name], DOB 09-17-1945, MRN [id number]  PCP:  Shirline Frees, MD  Cardiologist:  Donato Heinz, MD  Electrophysiologist:  None   Referring MD: Shirline Frees, MD   Chief Complaint  Patient presents with   Chest Pain    History of Present Illness:    [name] is a 76 y.o. female with a hx of hyperlipidemia who presents for follow-up.  She was referred by Dr. Kenton Kingfisher for evaluation of chest pain and palpitations, initially seen on 09/11/2020.  She reports that she started having chest pain in January 2022.  States that it occurred for 3 to 4 days and then resolved.  Describes dull aching pain in center of her chest, would last for 2 to 3 hours.  Unclear what brings it on, and has not noted any relationship with exertion.  She denies any shortness of breath but reports she feels tired and weak.  Also reports intermittent lightheadedness but denies any syncope.  Reports she is having episodes few times per week where she feels like her heart is racing.  Reports lightheadedness during episodes.  She denies any lower extremity edema.  Reports BP has been labile, as low as 90s over 50s and as high as 140s over 80s.  She has been taking propranolol but feels very fatigued on the medication.  No smoking history.  Family history includes father had valve replacement in 68s.  Lexiscan Myoview on 09/21/2020 showed normal perfusion, baseline ST depressions in inferior leads that worsened during stress, EF 74%.  Echocardiogram on 10/03/2020 showed normal biventricular function, no significant valvular disease.  Zio patch x7 days on 10/03/2020 showed 72 episodes of SVT, longest lasting 26 seconds.  Calcium score on 01/12/2021 was 187 (71st percentile).  Also with multiple small pulmonary nodules.  Since last clinic visit, she reports that she is doing well.  Denies any chest pain recently.  She walks daily for 30 minutes.  Had right hip  surgery in the fall, has been recovering well.  Reports brief lightheadedness but denies any syncope.  No lower extremity edema.  Reports palpitations have improved.  Past Medical History:  Diagnosis Date   Allergy    Arthritis    oa   Cataract of both eyes    Cervical dysplasia    Diarrhea    off and on last 3 months 1 episode today   Dry eyes    Fluid retention    slight leg swelling at times   GERD (gastroesophageal reflux disease)    Hypertension    Legally blind in left eye, as defined in [place]    since birth    Low back pain    Lumbar herniated disc    Numbness and tingling    feet bilat    Osteopenia 06/2015   T score -1.7   Syncope    none recent   Urinary frequency    Urinary hesitancy    Wears glasses     Past Surgical History:  Procedure Laterality Date   COLONOSCOPY     LUMBAR LAMINECTOMY     2009 secondary to ruptured disc;    TOTAL HIP ARTHROPLASTY Left 03/25/2018   Procedure: LEFT TOTAL HIP ARTHROPLASTY ANTERIOR APPROACH;  Surgeon: Gaynelle Arabian, MD;  Location: [facility];  Service: Orthopedics;  Laterality: Left;   TOTAL HIP ARTHROPLASTY Right 05/07/2021   Procedure: TOTAL HIP ARTHROPLASTY ANTERIOR APPROACH;  Surgeon: Gaynelle Arabian,  MD;  Location: [facility];  Service: Orthopedics;  Laterality: Right;   TOTAL KNEE ARTHROPLASTY Left 08/02/2015   Procedure: LEFT TOTAL KNEE ARTHROPLASTY;  Surgeon: Gaynelle Arabian, MD;  Location: [facility];  Service: Orthopedics;  Laterality: Left;   UPPER GASTROINTESTINAL ENDOSCOPY     VAGINAL HYSTERECTOMY  1980 or 1981   partial    Current Medications: Current Meds  Medication Sig   ALPRAZolam (XANAX) 0.25 MG tablet Take 0.25 mg by mouth 2 (two) times daily as needed for anxiety.    atorvastatin (LIPITOR) 20 MG tablet Take 1 tablet (20 mg total) by mouth daily. (Patient taking differently: Take 20 mg by mouth at bedtime.)   Calcium Carb-Cholecalciferol (CALCIUM 600+D3 PO) Take 1 tablet by mouth 2 (two) times daily.    Cholecalciferol (VITAMIN D3) 50 MCG (2000 UT) capsule Take 2,000 Units by mouth every evening.   estradiol (ESTRACE) 0.5 MG tablet Take 0.5 mg by mouth at bedtime.   ferrous sulfate 325 (65 FE) MG tablet Take 325 mg by mouth daily.   Multiple Vitamin (MULTIVITAMIN WITH MINERALS) TABS tablet Take 1 tablet by mouth daily. Senior Multivitamin   Omega-3 Fatty Acids (FISH OIL) 1000 MG CAPS Take 1,000 mg by mouth 2 (two) times daily.   pantoprazole (PROTONIX) 40 MG tablet Take 40 mg by mouth daily.   temazepam (RESTORIL) 15 MG capsule Take 15 mg by mouth at bedtime.   vitamin C (ASCORBIC ACID) 500 MG tablet Take 500 mg by mouth daily.   zinc gluconate 50 MG tablet Take 50 mg by mouth daily.   Current Facility-Administered Medications for the 08/29/21 encounter (Office Visit) with Donato Heinz, MD  Medication   0.9 %  sodium chloride infusion     Allergies:   Patient has no known allergies.   Social History   Socioeconomic History   Marital status: Married    Spouse name: Not on file   Number of children: Not on file   Years of education: Not on file   Highest education level: Not on file  Occupational History   Not on file  Tobacco Use   Smoking status: Never   Smokeless tobacco: Never  Vaping Use   Vaping Use: Never used  Substance and Sexual Activity   Alcohol use: No    Alcohol/week: 0.0 standard drinks   Drug use: No   Sexual activity: Yes    Birth control/protection: Surgical    Comment: Hyst-1st intercourse 65 yo-1 partner  Other Topics Concern   Not on file  Social History Narrative   Not on file   Social Determinants of Health   Financial Resource Strain: Not on file  Food Insecurity: Not on file  Transportation Needs: Not on file  Physical Activity: Not on file  Stress: Not on file  Social Connections: Not on file     Family History: The patient's family history includes Cancer in her father; Colon cancer in her father; Diabetes in her brother and  father; Heart disease in her father; Hypertension in her brother. There is no history of Esophageal cancer, Rectal cancer, or Stomach cancer.  ROS:   Please see the history of present illness.     All other systems reviewed and are negative.  EKGs/Labs/Other Studies Reviewed:    The following studies were reviewed today:  Echo 03/22:    1. Left ventricular ejection fraction, by estimation, is 60 to 65%. The  left ventricle has normal function. The left ventricle has no regional  wall  motion abnormalities. Left ventricular diastolic parameters are  indeterminate.   2. Right ventricular systolic function is normal. The right ventricular  size is normal. There is normal pulmonary artery systolic pressure.   3. The mitral valve is normal in structure. No evidence of mitral valve  regurgitation. No evidence of mitral stenosis.   4. The aortic valve is normal in structure. Aortic valve regurgitation is  not visualized. No aortic stenosis is present.   Long term monitor 03/22:  Study Highlights   72 episodes of SVT, longest lasting 26 seconds with rate 112 bpm     Patch Wear Time:  7 days and 2 hours (2022-03-11T11:48:10-498 to 2022-03-18T14:57:26-0400)   Patient had a min HR of 53 bpm, max HR of 144 bpm, and avg HR of 69 bpm. Predominant underlying rhythm was Sinus Rhythm. 72 Supraventricular Tachycardia runs occurred, the run with the fastest interval lasting 18 beats with a max rate of 144 bpm, the longest lasting 26.3 secs with an avg rate of 112 bpm. Isolated SVEs were rare (<1.0%), SVE Couplets were rare (<1.0%), and SVE Triplets were rare (<1.0%). Isolated VEs were rare (<1.0%), and no VE Couplets or VE Triplets were present. 2 patient triggered events, corresponding to sinus rhythm +/- PACs  Lexiscan 09/21/20  Study Highlights ST depressions in inferior leads at rest, worsened during stress, along with <34mm horizontal ST depressions in V5/6 The left ventricular ejection  fraction is hyperdynamic (>65%). Nuclear stress EF: 74%. The study is normal. This is a low risk study.   EKG:  08/29/21:NSR, rate 61, poor R wave progression 02/01/21: sinus rhythm, rate 54, low voltage, no st abnormalities 12/13/2020- No EKG ordered  09/11/2020- The ekg ordered demonstrates normal sinus rhythm, rate 71, low voltage, no ST abnormalities  Recent Labs: 04/24/2021: ALT 16 05/08/2021: BUN 8; Creatinine, Ser 0.57; Hemoglobin 10.7; Platelets 188; Potassium 4.0; Sodium 137  Recent Lipid Panel    Component Value Date/Time   CHOL 124 08/29/2021 1039   TRIG 76 08/29/2021 1039   HDL 63 08/29/2021 1039   CHOLHDL 2.0 08/29/2021 1039   CHOLHDL 3 09/15/2009 0954   VLDL 14.4 09/15/2009 0954   LDLCALC 46 08/29/2021 1039   LDLDIRECT 136.7 09/15/2009 0954    Physical Exam:    VS:  BP 124/74    Pulse 61    Ht 5\' 4"  (1.626 m)    Wt 142 lb 6.4 oz (64.6 kg)    LMP 07/16/1979    SpO2 99%    BMI 24.44 kg/m     Wt Readings from Last 3 Encounters:  08/29/21 142 lb 6.4 oz (64.6 kg)  05/07/21 136 lb (61.7 kg)  04/24/21 136 lb (61.7 kg)     UKG:URKY nourished, well developed in no acute distress HEENT: Normal NECK: No JVD; No carotid bruits LYMPHATICS: No lymphadenopathy CARDIAC:RRR, no murmurs, rubs, gallops RESPIRATORY:  Clear to auscultation without rales, wheezing or rhonchi  ABDOMEN: Soft, non-tender, non-distended MUSCULOSKELETAL:  No edema; No deformity  SKIN: Warm and dry NEUROLOGIC:  Alert and oriented x 3 PSYCHIATRIC:  Normal affect   ASSESSMENT:    1. Chest pain of uncertain etiology   2. SVT (supraventricular tachycardia) ([place])   3. Hyperlipidemia, unspecified hyperlipidemia type   4. Follow-up exam   5. Lung nodule, multiple      PLAN:    Chest pain: Atypical in description but does have CAD risk factors (age, hyperlipidemia).  Lexiscan Myoview on 09/21/2020 showed normal perfusion, baseline ST depressions in inferior leads  that worsened during stress, EF  74%.  Echocardiogram on 10/03/2020 showed normal biventricular function, no significant valvular disease.   -No further cardiac work-up recommended  Palpitations/SVT: Zio patch x7 days on 10/03/2020 showed 72 episodes of SVT, longest lasting 26 seconds.  She has been taking as needed propranolol, stopped propranolol and started Toprol-XL 25 mg daily.  Dose reduced to 12.5 mg daily as reported heart rates in 40s at home and intermittent lightheadedness  Hyperlipidemia: LDL 98 on 12/13/20.  Calcium score on 01/12/2021 was 187 (71st percentile).  Started atorvastatin 20 mg daily.  Will check lipid panel  Pulmonary nodules: Multiple small pulmonary nodules bilaterally on calcium score.  Largest measures 6 mm.  Repeat chest CT was recommended at 6 months.  Repeat chest CT on 07/13/2021 showed 7 mm nodule in right lower lobe that was not included on previous CT.  Repeat chest CT in 6 months recommended  RTC in 6 months    Medication Adjustments/Labs and Tests Ordered: Current medicines are reviewed at length with the patient today.  Concerns regarding medicines are outlined above.  Orders Placed This Encounter  Procedures   Lipid panel   EKG 12-Lead    No orders of the defined types were placed in this encounter.    Patient Instructions  Medication Instructions:  Your physician recommends that you continue on your current medications as directed. Please refer to the Current Medication list given to you today.  *If you need a refill on your cardiac medications before your next appointment, please call your pharmacy*   Lab Work: Your physician recommends that you return for lab work in:  TODAY: Lipids If you have labs (blood work) drawn today and your tests are completely normal, you will receive your results only by: [place] (if you have [place]) OR A paper copy in the mail If you have any lab test that is abnormal or we need to change your treatment, we will call you to review the  results.   Testing/Procedures: None   Follow-Up: At [facility], you and your health needs are our priority.  As part of our continuing mission to provide you with exceptional heart care, we have created designated Provider Care Teams.  These Care Teams include your primary Cardiologist (physician) and Advanced Practice Providers (APPs -  Physician Assistants and Nurse Practitioners) who all work together to provide you with the care you need, when you need it.  We recommend signing up for the patient portal called "MyChart".  Sign up information is provided on this After Visit Summary.  MyChart is used to connect with patients for Virtual Visits (Telemedicine).  Patients are able to view lab/test results, encounter notes, upcoming appointments, etc.  Non-urgent messages can be sent to your provider as well.   To learn more about what you can do with MyChart, go to [email or web address].    Your next appointment:   6 month(s)  The format for your next appointment:   In Person  Provider:   Donato Heinz, MD          Signed, Donato Heinz, MD  09/03/2021 8:45 PM    [facility]

## 2021-08-29 ENCOUNTER — Other Ambulatory Visit: Payer: Self-pay

## 2021-08-29 ENCOUNTER — Ambulatory Visit: Payer: Medicare Other | Admitting: Cardiology

## 2021-08-29 ENCOUNTER — Encounter: Payer: Self-pay | Admitting: Cardiology

## 2021-08-29 VITALS — BP 124/74 | HR 61 | Ht 64.0 in | Wt 142.4 lb

## 2021-08-29 DIAGNOSIS — Z09 Encounter for follow-up examination after completed treatment for conditions other than malignant neoplasm: Secondary | ICD-10-CM

## 2021-08-29 DIAGNOSIS — R079 Chest pain, unspecified: Secondary | ICD-10-CM

## 2021-08-29 DIAGNOSIS — E785 Hyperlipidemia, unspecified: Secondary | ICD-10-CM

## 2021-08-29 DIAGNOSIS — R918 Other nonspecific abnormal finding of lung field: Secondary | ICD-10-CM | POA: Diagnosis not present

## 2021-08-29 DIAGNOSIS — I471 Supraventricular tachycardia, unspecified: Secondary | ICD-10-CM

## 2021-08-29 DIAGNOSIS — Z780 Asymptomatic menopausal state: Secondary | ICD-10-CM | POA: Diagnosis not present

## 2021-08-29 DIAGNOSIS — Z1231 Encounter for screening mammogram for malignant neoplasm of breast: Secondary | ICD-10-CM | POA: Diagnosis not present

## 2021-08-29 LAB — LIPID PANEL
Chol/HDL Ratio: 2 ratio (ref 0.0–4.4)
Cholesterol, Total: 124 mg/dL (ref 100–199)
HDL: 63 mg/dL
LDL Chol Calc (NIH): 46 mg/dL (ref 0–99)
Triglycerides: 76 mg/dL (ref 0–149)
VLDL Cholesterol Cal: 15 mg/dL (ref 5–40)

## 2021-08-29 NOTE — Patient Instructions (Signed)
Medication Instructions:  Your physician recommends that you continue on your current medications as directed. Please refer to the Current Medication list given to you today.  *If you need a refill on your cardiac medications before your next appointment, please call your pharmacy*   Lab Work: Your physician recommends that you return for lab work in:  TODAY: Lipids If you have labs (blood work) drawn today and your tests are completely normal, you will receive your results only by: [place] (if you have [place]) OR A paper copy in the mail If you have any lab test that is abnormal or we need to change your treatment, we will call you to review the results.   Testing/Procedures: None   Follow-Up: At [facility], you and your health needs are our priority.  As part of our continuing mission to provide you with exceptional heart care, we have created designated Provider Care Teams.  These Care Teams include your primary Cardiologist (physician) and Advanced Practice Providers (APPs -  Physician Assistants and Nurse Practitioners) who all work together to provide you with the care you need, when you need it.  We recommend signing up for the patient portal called "MyChart".  Sign up information is provided on this After Visit Summary.  MyChart is used to connect with patients for Virtual Visits (Telemedicine).  Patients are able to view lab/test results, encounter notes, upcoming appointments, etc.  Non-urgent messages can be sent to your provider as well.   To learn more about what you can do with MyChart, go to [email or web address].    Your next appointment:   6 month(s)  The format for your next appointment:   In Person  Provider:   Donato Heinz, MD

## 2021-08-30 ENCOUNTER — Encounter: Payer: Self-pay | Admitting: *Deleted

## 2021-09-10 DIAGNOSIS — H524 Presbyopia: Secondary | ICD-10-CM | POA: Diagnosis not present

## 2021-10-17 ENCOUNTER — Telehealth: Payer: Self-pay | Admitting: Cardiology

## 2021-10-17 NOTE — Telephone Encounter (Signed)
Pt c/o of Chest Pain: STAT if CP now or developed within 24 hours ? ?1. Are you having CP right now? no ? ?2. Are you experiencing any other symptoms (ex. SOB, nausea, vomiting, sweating)? Sob, dizziness, lightheaded  ? ?3. How long have you been experiencing CP? 2-3 weeks  ? ?4. Is your CP continuous or coming and going? Coming and going  ? ?5. Have you taken Nitroglycerin? mp ?? ? ?

## 2021-10-17 NOTE — Telephone Encounter (Signed)
Pt states she does have chest pain in the middle of her chest and sometimes it's in the top of my stomach. I'm not sure if it's heart related or not. "I think he needs to run the dye, I don't understand why he will now." I asked the pt does she take her medications as prescribed she states yes. Pt takes Protonix and OTC gas/acid reflux medications and it does not help. Will get message to Dr. Gardiner Rhyme for review.   ?

## 2021-10-19 NOTE — Telephone Encounter (Signed)
Can we find out more details about her chest pain?  Is this different than what she had before when we did her stress test?  Is it exertional? ?

## 2021-10-19 NOTE — Telephone Encounter (Signed)
Spoke to patient, reports pain is same as previous chest pain.  She states she has been working out in the yard a lot spreading grass seed and [employer], planting a garden.  She state when she was trying to help do this she continued to have episodes of shortness of breath and chest pain.  Reports pain was always with exertion, has not occurred at rest.   She does report burping sometimes with pain as well, burp helps pain.   She is wondering if she needs to see GI as well but concerned this is heart related too.     She has felt better since they finished working in the yard-no episodes since.    ? ?Appt scheduled with Dr. Gardiner Rhyme to discuss further 4/20.  Patient aware and verbalized understanding.   ?

## 2021-10-28 NOTE — Progress Notes (Deleted)
Cardiology Office Note:    Date:  10/28/2021   ID:  [name], DOB 28-Feb-1946, MRN [id number]  PCP:  Shirline Frees, MD  Cardiologist:  Donato Heinz, MD  Electrophysiologist:  None   Referring MD: Shirline Frees, MD   No chief complaint on file.   History of Present Illness:    [name] is a 76 y.o. female with a hx of hyperlipidemia who presents for follow-up.  She was referred by Dr. Kenton Kingfisher for evaluation of chest pain and palpitations, initially seen on 09/11/2020.  She reports that she started having chest pain in January 2022.  States that it occurred for 3 to 4 days and then resolved.  Describes dull aching pain in center of her chest, would last for 2 to 3 hours.  Unclear what brings it on, and has not noted any relationship with exertion.  She denies any shortness of breath but reports she feels tired and weak.  Also reports intermittent lightheadedness but denies any syncope.  Reports she is having episodes few times per week where she feels like her heart is racing.  Reports lightheadedness during episodes.  She denies any lower extremity edema.  Reports BP has been labile, as low as 90s over 50s and as high as 140s over 80s.  She has been taking propranolol but feels very fatigued on the medication.  No smoking history.  Family history includes father had valve replacement in 48s.  Lexiscan Myoview on 09/21/2020 showed normal perfusion, baseline ST depressions in inferior leads that worsened during stress, EF 74%.  Echocardiogram on 10/03/2020 showed normal biventricular function, no significant valvular disease.  Zio patch x7 days on 10/03/2020 showed 72 episodes of SVT, longest lasting 26 seconds.  Calcium score on 01/12/2021 was 187 (71st percentile).  Also with multiple small pulmonary nodules.  Since last clinic visit,  she reports that she is doing well.  Denies any chest pain recently.  She walks daily for 30 minutes.  Had right hip surgery in the fall, has  been recovering well.  Reports brief lightheadedness but denies any syncope.  No lower extremity edema.  Reports palpitations have improved.  Past Medical History:  Diagnosis Date   Allergy    Arthritis    oa   Cataract of both eyes    Cervical dysplasia    Diarrhea    off and on last 3 months 1 episode today   Dry eyes    Fluid retention    slight leg swelling at times   GERD (gastroesophageal reflux disease)    Hypertension    Legally blind in left eye, as defined in [place]    since birth    Low back pain    Lumbar herniated disc    Numbness and tingling    feet bilat    Osteopenia 06/2015   T score -1.7   Syncope    none recent   Urinary frequency    Urinary hesitancy    Wears glasses     Past Surgical History:  Procedure Laterality Date   COLONOSCOPY     LUMBAR LAMINECTOMY     2009 secondary to ruptured disc;    TOTAL HIP ARTHROPLASTY Left 03/25/2018   Procedure: LEFT TOTAL HIP ARTHROPLASTY ANTERIOR APPROACH;  Surgeon: Gaynelle Arabian, MD;  Location: [facility];  Service: Orthopedics;  Laterality: Left;   TOTAL HIP ARTHROPLASTY Right 05/07/2021   Procedure: TOTAL HIP ARTHROPLASTY ANTERIOR APPROACH;  Surgeon: Gaynelle Arabian, MD;  Location: [facility];  Service: Orthopedics;  Laterality: Right;   TOTAL KNEE ARTHROPLASTY Left 08/02/2015   Procedure: LEFT TOTAL KNEE ARTHROPLASTY;  Surgeon: Gaynelle Arabian, MD;  Location: [facility];  Service: Orthopedics;  Laterality: Left;   UPPER GASTROINTESTINAL ENDOSCOPY     VAGINAL HYSTERECTOMY  1980 or 1981   partial    Current Medications: No outpatient medications have been marked as taking for the 11/01/21 encounter (Appointment) with Donato Heinz, MD.   Current Facility-Administered Medications for the 11/01/21 encounter (Appointment) with Donato Heinz, MD  Medication   0.9 %  sodium chloride infusion     Allergies:   Patient has no known allergies.   Social History   Socioeconomic History   Marital status:  Married    Spouse name: Not on file   Number of children: Not on file   Years of education: Not on file   Highest education level: Not on file  Occupational History   Not on file  Tobacco Use   Smoking status: Never   Smokeless tobacco: Never  Vaping Use   Vaping Use: Never used  Substance and Sexual Activity   Alcohol use: No    Alcohol/week: 0.0 standard drinks   Drug use: No   Sexual activity: Yes    Birth control/protection: Surgical    Comment: Hyst-1st intercourse 69 yo-1 partner  Other Topics Concern   Not on file  Social History Narrative   Not on file   Social Determinants of Health   Financial Resource Strain: Not on file  Food Insecurity: Not on file  Transportation Needs: Not on file  Physical Activity: Not on file  Stress: Not on file  Social Connections: Not on file     Family History: The patient's family history includes Cancer in her father; Colon cancer in her father; Diabetes in her brother and father; Heart disease in her father; Hypertension in her brother. There is no history of Esophageal cancer, Rectal cancer, or Stomach cancer.  ROS:   Please see the history of present illness.     All other systems reviewed and are negative.  EKGs/Labs/Other Studies Reviewed:    The following studies were reviewed today:  Echo 03/22:    1. Left ventricular ejection fraction, by estimation, is 60 to 65%. The  left ventricle has normal function. The left ventricle has no regional  wall motion abnormalities. Left ventricular diastolic parameters are  indeterminate.   2. Right ventricular systolic function is normal. The right ventricular  size is normal. There is normal pulmonary artery systolic pressure.   3. The mitral valve is normal in structure. No evidence of mitral valve  regurgitation. No evidence of mitral stenosis.   4. The aortic valve is normal in structure. Aortic valve regurgitation is  not visualized. No aortic stenosis is present.    Long term monitor 03/22:  Study Highlights   72 episodes of SVT, longest lasting 26 seconds with rate 112 bpm     Patch Wear Time:  7 days and 2 hours (2022-03-11T11:48:10-498 to 2022-03-18T14:57:26-0400)   Patient had a min HR of 53 bpm, max HR of 144 bpm, and avg HR of 69 bpm. Predominant underlying rhythm was Sinus Rhythm. 72 Supraventricular Tachycardia runs occurred, the run with the fastest interval lasting 18 beats with a max rate of 144 bpm, the longest lasting 26.3 secs with an avg rate of 112 bpm. Isolated SVEs were rare (<1.0%), SVE Couplets were rare (<1.0%), and SVE Triplets were rare (<1.0%). Isolated VEs were  rare (<1.0%), and no VE Couplets or VE Triplets were present. 2 patient triggered events, corresponding to sinus rhythm +/- PACs  Lexiscan 09/21/20  Study Highlights ST depressions in inferior leads at rest, worsened during stress, along with <63m horizontal ST depressions in V5/6 The left ventricular ejection fraction is hyperdynamic (>65%). Nuclear stress EF: 74%. The study is normal. This is a low risk study.   EKG:  08/29/21:NSR, rate 61, poor R wave progression 02/01/21: sinus rhythm, rate 54, low voltage, no st abnormalities 12/13/2020- No EKG ordered  09/11/2020- The ekg ordered demonstrates normal sinus rhythm, rate 71, low voltage, no ST abnormalities  Recent Labs: 04/24/2021: ALT 16 05/08/2021: BUN 8; Creatinine, Ser 0.57; Hemoglobin 10.7; Platelets 188; Potassium 4.0; Sodium 137  Recent Lipid Panel    Component Value Date/Time   CHOL 124 08/29/2021 1039   TRIG 76 08/29/2021 1039   HDL 63 08/29/2021 1039   CHOLHDL 2.0 08/29/2021 1039   CHOLHDL 3 09/15/2009 0954   VLDL 14.4 09/15/2009 0954   LDLCALC 46 08/29/2021 1039   LDLDIRECT 136.7 09/15/2009 0954    Physical Exam:    VS:  LMP 07/16/1979     Wt Readings from Last 3 Encounters:  08/29/21 142 lb 6.4 oz (64.6 kg)  05/07/21 136 lb (61.7 kg)  04/24/21 136 lb (61.7 kg)     GWLN:LGXQ nourished, well developed in no acute distress HEENT: Normal NECK: No JVD; No carotid bruits LYMPHATICS: No lymphadenopathy CARDIAC:RRR, no murmurs, rubs, gallops RESPIRATORY:  Clear to auscultation without rales, wheezing or rhonchi  ABDOMEN: Soft, non-tender, non-distended MUSCULOSKELETAL:  No edema; No deformity  SKIN: Warm and dry NEUROLOGIC:  Alert and oriented x 3 PSYCHIATRIC:  Normal affect   ASSESSMENT:    No diagnosis found.    PLAN:    Chest pain: Atypical in description but does have CAD risk factors (age, hyperlipidemia).  Lexiscan Myoview on 09/21/2020 showed normal perfusion, baseline ST depressions in inferior leads that worsened during stress, EF 74%.  Echocardiogram on 10/03/2020 showed normal biventricular function, no significant valvular disease.   -No further cardiac work-up recommended  Palpitations/SVT: Zio patch x7 days on 10/03/2020 showed 72 episodes of SVT, longest lasting 26 seconds.  She has been taking as needed propranolol, stopped propranolol and started Toprol-XL 25 mg daily.  Dose reduced to 12.5 mg daily as reported heart rates in 40s at home and intermittent lightheadedness  Hyperlipidemia: LDL 98 on 12/13/20.  Calcium score on 01/12/2021 was 187 (71st percentile).  Started atorvastatin 20 mg daily, LDL 46 on 08/29/2021  Pulmonary nodules: Multiple small pulmonary nodules bilaterally on calcium score.  Largest measures 6 mm.  Repeat chest CT was recommended at 6 months.  Repeat chest CT on 07/13/2021 showed 7 mm nodule in right lower lobe that was not included on previous CT.  Repeat chest CT in 6 months recommended  RTC in ***    Medication Adjustments/Labs and Tests Ordered: Current medicines are reviewed at length with the patient today.  Concerns regarding medicines are outlined above.  No orders of the defined types were placed in this encounter.   No orders of the defined types were placed in this encounter.    There are no Patient  Instructions on file for this visit.     Signed, CDonato Heinz MD  10/28/2021 2:48 PM    [facility]

## 2021-11-01 ENCOUNTER — Ambulatory Visit: Payer: Medicare Other | Admitting: Cardiology

## 2021-12-26 ENCOUNTER — Other Ambulatory Visit: Payer: Self-pay | Admitting: Cardiology

## 2022-01-11 ENCOUNTER — Ambulatory Visit (INDEPENDENT_AMBULATORY_CARE_PROVIDER_SITE_OTHER)
Admission: RE | Admit: 2022-01-11 | Discharge: 2022-01-11 | Disposition: A | Discharge status: Home / Self Care | Payer: Medicare Other | Source: Ambulatory Visit | Attending: Cardiology | Admitting: Cardiology

## 2022-01-11 DIAGNOSIS — R918 Other nonspecific abnormal finding of lung field: Secondary | ICD-10-CM

## 2022-01-14 ENCOUNTER — Telehealth: Payer: Self-pay | Admitting: Cardiology

## 2022-01-14 NOTE — Telephone Encounter (Signed)
Spoke with pt regarding results of chest CT. All questions answered and pt verbalizes understanding.

## 2022-01-14 NOTE — Telephone Encounter (Signed)
Calling to see what her results from her CT. Please advise

## 2022-02-26 DIAGNOSIS — H2513 Age-related nuclear cataract, bilateral: Secondary | ICD-10-CM | POA: Diagnosis not present

## 2022-03-02 ENCOUNTER — Other Ambulatory Visit: Payer: Self-pay | Admitting: Cardiology

## 2022-03-05 NOTE — Progress Notes (Signed)
Cardiology Office Note:    Date:  03/06/2022   ID:  JANESA Lane, DOB [birth year], MRN [id number]  PCP:  Shirline Frees, MD  Cardiologist:  Donato Heinz, MD  Electrophysiologist:  None   Referring MD: Shirline Frees, MD   Chief Complaint  Patient presents with   Chest Pain    History of Present Illness:    [name] is a 76 y.o. female with a hx of hyperlipidemia who presents for follow-up.  She was referred by Dr. Kenton Kingfisher for evaluation of chest pain and palpitations, initially seen on 09/11/2020.  She reports that she started having chest pain in January 2022.  States that it occurred for 3 to 4 days and then resolved.  Describes dull aching pain in center of her chest, would last for 2 to 3 hours.  Unclear what brings it on, and has not noted any relationship with exertion.  She denies any shortness of breath but reports she feels tired and weak.  Also reports intermittent lightheadedness but denies any syncope.  Reports she is having episodes few times per week where she feels like her heart is racing.  Reports lightheadedness during episodes.  She denies any lower extremity edema.  Reports BP has been labile, as low as 90s over 50s and as high as 140s over 80s.  She has been taking propranolol but feels very fatigued on the medication.  No smoking history.  Family history includes father had valve replacement in 72s.  Lexiscan Myoview on 09/21/2020 showed normal perfusion, baseline ST depressions in inferior leads that worsened during stress, EF 74%.  Echocardiogram on 10/03/2020 showed normal biventricular function, no significant valvular disease.  Zio patch x7 days on 10/03/2020 showed 72 episodes of SVT, longest lasting 26 seconds.  Calcium score on 01/12/2021 was 187 (71st percentile).  Also with multiple small pulmonary nodules.  Since last clinic visit, she reports she has been doing okay.  Reports rare episodes of chest tightness.  Reports has been having near syncopal  episodes, happens couple times per month where she feels like she is going to pass out.  No actual syncope.  She denies any dyspnea, lower extreme edema, or palpitations.  Walks daily.  Past Medical History:  Diagnosis Date   Allergy    Arthritis    oa   Cataract of both eyes    Cervical dysplasia    Diarrhea    off and on last 3 months 1 episode today   Dry eyes    Fluid retention    slight leg swelling at times   GERD (gastroesophageal reflux disease)    Hypertension    Legally blind in left eye, as defined in [place]    since birth    Low back pain    Lumbar herniated disc    Numbness and tingling    feet bilat    Osteopenia 06/2015   T score -1.7   Syncope    none recent   Urinary frequency    Urinary hesitancy    Wears glasses     Past Surgical History:  Procedure Laterality Date   COLONOSCOPY     LUMBAR LAMINECTOMY     2009 secondary to ruptured disc;    TOTAL HIP ARTHROPLASTY Left 03/25/2018   Procedure: LEFT TOTAL HIP ARTHROPLASTY ANTERIOR APPROACH;  Surgeon: Gaynelle Arabian, MD;  Location: [facility];  Service: Orthopedics;  Laterality: Left;   TOTAL HIP ARTHROPLASTY Right 05/07/2021   Procedure: TOTAL HIP ARTHROPLASTY ANTERIOR APPROACH;  Surgeon: Gaynelle Arabian, MD;  Location: [facility];  Service: Orthopedics;  Laterality: Right;   TOTAL KNEE ARTHROPLASTY Left 08/02/2015   Procedure: LEFT TOTAL KNEE ARTHROPLASTY;  Surgeon: Gaynelle Arabian, MD;  Location: [facility];  Service: Orthopedics;  Laterality: Left;   UPPER GASTROINTESTINAL ENDOSCOPY     VAGINAL HYSTERECTOMY  1980 or 1981   partial    Current Medications: Current Meds  Medication Sig   ALPRAZolam (XANAX) 0.25 MG tablet Take 0.25 mg by mouth 2 (two) times daily as needed for anxiety.    atorvastatin (LIPITOR) 20 MG tablet Take 1 tablet by mouth once daily   Calcium Carb-Cholecalciferol (CALCIUM 600+D3 PO) Take 1 tablet by mouth 2 (two) times daily.   Cholecalciferol (VITAMIN D3) 50 MCG (2000 UT) capsule Take 2,000  Units by mouth every evening.   estradiol (ESTRACE) 0.5 MG tablet Take 0.5 mg by mouth at bedtime.   ferrous sulfate 325 (65 FE) MG tablet Take 325 mg by mouth daily.   metoprolol succinate (TOPROL-XL) 25 MG 24 hr tablet Take 1/2 (one-half) tablet by mouth once daily   Multiple Vitamin (MULTIVITAMIN WITH MINERALS) TABS tablet Take 1 tablet by mouth daily. Senior Multivitamin   Omega-3 Fatty Acids (FISH OIL) 1000 MG CAPS Take 1,000 mg by mouth 2 (two) times daily.   pantoprazole (PROTONIX) 40 MG tablet Take 40 mg by mouth daily.   temazepam (RESTORIL) 15 MG capsule Take 15 mg by mouth at bedtime.   vitamin C (ASCORBIC ACID) 500 MG tablet Take 500 mg by mouth daily.   zinc gluconate 50 MG tablet Take 50 mg by mouth daily.   Current Facility-Administered Medications for the 03/06/22 encounter (Office Visit) with Donato Heinz, MD  Medication   0.9 %  sodium chloride infusion     Allergies:   Patient has no known allergies.   Social History   Socioeconomic History   Marital status: Married    Spouse name: Not on file   Number of children: Not on file   Years of education: Not on file   Highest education level: Not on file  Occupational History   Not on file  Tobacco Use   Smoking status: Never   Smokeless tobacco: Never  Vaping Use   Vaping Use: Never used  Substance and Sexual Activity   Alcohol use: No    Alcohol/week: 0.0 standard drinks of alcohol   Drug use: No   Sexual activity: Yes    Birth control/protection: Surgical    Comment: Hyst-1st intercourse 81 yo-1 partner  Other Topics Concern   Not on file  Social History Narrative   Not on file   Social Determinants of Health   Financial Resource Strain: Not on file  Food Insecurity: Not on file  Transportation Needs: Not on file  Physical Activity: Not on file  Stress: Not on file  Social Connections: Not on file     Family History: The patient's family history includes Cancer in her father; Colon  cancer in her father; Diabetes in her brother and father; Heart disease in her father; Hypertension in her brother. There is no history of Esophageal cancer, Rectal cancer, or Stomach cancer.  ROS:   Please see the history of present illness.     All other systems reviewed and are negative.  EKGs/Labs/Other Studies Reviewed:    The following studies were reviewed today:  Echo 03/22:    1. Left ventricular ejection fraction, by estimation, is 60 to 65%. The  left ventricle  has normal function. The left ventricle has no regional  wall motion abnormalities. Left ventricular diastolic parameters are  indeterminate.   2. Right ventricular systolic function is normal. The right ventricular  size is normal. There is normal pulmonary artery systolic pressure.   3. The mitral valve is normal in structure. No evidence of mitral valve  regurgitation. No evidence of mitral stenosis.   4. The aortic valve is normal in structure. Aortic valve regurgitation is  not visualized. No aortic stenosis is present.   Long term monitor 03/22:  Study Highlights   72 episodes of SVT, longest lasting 26 seconds with rate 112 bpm     Patch Wear Time:  7 days and 2 hours (2022-03-11T11:48:10-498 to 2022-03-18T14:57:26-0400)   Patient had a min HR of 53 bpm, max HR of 144 bpm, and avg HR of 69 bpm. Predominant underlying rhythm was Sinus Rhythm. 72 Supraventricular Tachycardia runs occurred, the run with the fastest interval lasting 18 beats with a max rate of 144 bpm, the longest lasting 26.3 secs with an avg rate of 112 bpm. Isolated SVEs were rare (<1.0%), SVE Couplets were rare (<1.0%), and SVE Triplets were rare (<1.0%). Isolated VEs were rare (<1.0%), and no VE Couplets or VE Triplets were present. 2 patient triggered events, corresponding to sinus rhythm +/- PACs  Lexiscan 09/21/20  Study Highlights ST depressions in inferior leads at rest, worsened during stress, along with <23m horizontal ST  depressions in V5/6 The left ventricular ejection fraction is hyperdynamic (>65%). Nuclear stress EF: 74%. The study is normal. This is a low risk study.   EKG:  03/06/2022: Normal sinus rhythm, rate 75, low voltage 08/29/21:NSR, rate 61, poor R wave progression 02/01/21: sinus rhythm, rate 54, low voltage, no st abnormalities 12/13/2020- No EKG ordered  09/11/2020- The ekg ordered demonstrates normal sinus rhythm, rate 71, low voltage, no ST abnormalities  Recent Labs: 04/24/2021: ALT 16 05/08/2021: BUN 8; Creatinine, Ser 0.57; Hemoglobin 10.7; Platelets 188; Potassium 4.0; Sodium 137  Recent Lipid Panel    Component Value Date/Time   CHOL 124 08/29/2021 1039   TRIG 76 08/29/2021 1039   HDL 63 08/29/2021 1039   CHOLHDL 2.0 08/29/2021 1039   CHOLHDL 3 09/15/2009 0954   VLDL 14.4 09/15/2009 0954   LDLCALC 46 08/29/2021 1039   LDLDIRECT 136.7 09/15/2009 0954    Physical Exam:    VS:  BP 126/78   Pulse 75   Ht '5\' 4"'$  (1.626 m)   Wt 137 lb 3.2 oz (62.2 kg)   LMP 07/16/1979   SpO2 96%   BMI 23.55 kg/m     Wt Readings from Last 3 Encounters:  03/06/22 137 lb 3.2 oz (62.2 kg)  08/29/21 142 lb 6.4 oz (64.6 kg)  05/07/21 136 lb (61.7 kg)     GYIR:SWNInourished, well developed in no acute distress HEENT: Normal NECK: No JVD; No carotid bruits LYMPHATICS: No lymphadenopathy CARDIAC:RRR, no murmurs, rubs, gallops RESPIRATORY:  Clear to auscultation without rales, wheezing or rhonchi  ABDOMEN: Soft, non-tender, non-distended MUSCULOSKELETAL:  No edema; No deformity  SKIN: Warm and dry NEUROLOGIC:  Alert and oriented x 3 PSYCHIATRIC:  Normal affect   ASSESSMENT:    1. Near syncope   2. SVT (supraventricular tachycardia) (HCC)   3. Chest pain of uncertain etiology   4. Hyperlipidemia, unspecified hyperlipidemia type   5. Lung nodule, multiple       PLAN:    Chest pain: Atypical in description but does have CAD risk factors (  age, hyperlipidemia).  Lexiscan Myoview on  09/21/2020 showed normal perfusion, baseline ST depressions in inferior leads that worsened during stress, EF 74%.  Echocardiogram on 10/03/2020 showed normal biventricular function, no significant valvular disease.   -No further cardiac work-up recommended  Palpitations/SVT: Zio patch x7 days on 10/03/2020 showed 72 episodes of SVT, longest lasting 26 seconds.  She has been taking as needed propranolol, stopped propranolol and started Toprol-XL 25 mg daily.  Dose reduced to 12.5 mg daily as reported heart rates in 40s at home and intermittent lightheadedness -She is reporting near syncopal episodes, will check Zio patch x2 weeks.  Check echocardiogram  Hyperlipidemia: LDL 98 on 12/13/20.  Calcium score on 01/12/2021 was 187 (71st percentile).  Started atorvastatin 20 mg daily.  LDL 46 on 08/29/2021  Pulmonary nodules: Multiple small pulmonary nodules bilaterally on calcium score.  Largest measures 6 mm.  Repeat chest CT was recommended at 6 months.  Repeat chest CT on 07/13/2021 showed 7 mm nodule in right lower lobe that was not included on previous CT. stable 1 chest CT 01/11/2022, optional follow-up at 18 to 24 months  RTC in 6 months    Medication Adjustments/Labs and Tests Ordered: Current medicines are reviewed at length with the patient today.  Concerns regarding medicines are outlined above.  Orders Placed This Encounter  Procedures   LONG TERM MONITOR (3-14 DAYS)   EKG 12-Lead   ECHOCARDIOGRAM COMPLETE    No orders of the defined types were placed in this encounter.    Patient Instructions  Medication Instructions:  Your physician recommends that you continue on your current medications as directed. Please refer to the Current Medication list given to you today.  *If you need a refill on your cardiac medications before your next appointment, please call your pharmacy*  Testing/Procedures: Your physician has requested that you have an echocardiogram. Echocardiography is a painless  test that uses sound waves to create images of your heart. It provides your doctor with information about the size and shape of your heart and how well your heart's chambers and valves are working. This procedure takes approximately one hour. There are no restrictions for this procedure.  ZIO XT- Long Term Monitor Instructions   Your physician has requested you wear a ZIO patch monitor for _14_ days.  This is a single patch monitor.   [employer] supplies one patch monitor per enrollment. Additional stickers are not available. Please do not apply patch if you will be having a Nuclear Stress Test, Echocardiogram, Cardiac CT, MRI, or Chest Xray during the period you would be wearing the monitor. The patch cannot be worn during these tests. You cannot remove and re-apply the ZIO XT patch monitor.  Your ZIO patch monitor will be sent Fed Ex from [employer] directly to your home address. It may take 3-5 days to receive your monitor after you have been enrolled.  Once you have received your monitor, please review the enclosed instructions. Your monitor has already been registered assigning a specific monitor serial # to you.  Billing and Patient Assistance Program Information   We have supplied [employer] with any of your insurance information on file for billing purposes. [employer] offers a sliding scale Patient Assistance Program for patients that do not have insurance, or whose insurance does not completely cover the cost of the ZIO monitor.   You must apply for the Patient Assistance Program to qualify for this discounted rate.     To apply, please call [employer] at [phone],  select option 4, then select option 2, and ask to apply for Patient Assistance Program.  Theodore Demark will ask your household income, and how many people are in your household.  They will quote your out-of-pocket cost based on that information.  [employer] will also be able to set up a 34-month interest-free payment plan if  needed.  Applying the monitor   Shave hair from upper left chest.  Hold abrader disc by orange tab. Rub abrader in 40 strokes over the upper left chest as indicated in your monitor instructions.  Clean area with 4 enclosed alcohol pads. Let dry.  Apply patch as indicated in monitor instructions. Patch will be placed under collarbone on left side of chest with arrow pointing upward.  Rub patch adhesive wings for 2 minutes. Remove white label marked "1". Remove the white label marked "2". Rub patch adhesive wings for 2 additional minutes.  While looking in a mirror, press and release button in center of patch. A small green light will flash 3-4 times. This will be your only indicator that the monitor has been turned on. ?  Do not shower for the first 24 hours. You may shower after the first 24 hours.  Press the button if you feel a symptom. You will hear a small click. Record Date, Time and Symptom in the Patient Logbook.  When you are ready to remove the patch, follow instructions on the last 2 pages of the Patient Logbook. Stick patch monitor onto the last page of Patient Logbook.  Place Patient Logbook in the blue and white box.  Use locking tab on box and tape box closed securely.  The blue and white box has prepaid postage on it. Please place it in the mailbox as soon as possible. Your physician should have your test results approximately 7 days after the monitor has been [facility]  Call [facility] [phone] you have questions regarding your ZIO XT patch monitor. Call them immediately if you see an orange light blinking on your monitor.  If your monitor falls off in less than 4 days, contact our Monitor department at [phone] ?If your monitor becomes loose or falls off after 4 days call [employer] at [phone] suggestions on securing your monitor.?  Follow-Up: [facility] you and your health needs are our priority.  As part of  our continuing mission to provide you with exceptional heart care, we have created designated Provider Care Teams.  These Care Teams include your primary Cardiologist (physician) and Advanced Practice Providers (APPs -  Physician Assistants and Nurse Practitioners) who all work together to provide you with the care you need, when you need it.  We recommend signing up for the patient portal called "MyChart".  Sign up information is provided on this After Visit Summary.  MyChart is used to connect with patients for Virtual Visits (Telemedicine).  Patients are able to view lab/test results, encounter notes, upcoming appointments, etc.  Non-urgent messages can be sent to your provider as well.   To learn more about what you can do with MyChart, go to [email or web address]    Your next appointment:   6 month(s)  The format for your next appointment:   In Person  Provider:   CDonato Heinz MD {              Signed, CDonato Heinz MD  03/06/2022 5:51 PM    [place]

## 2022-03-06 ENCOUNTER — Encounter: Payer: Self-pay | Admitting: Cardiology

## 2022-03-06 ENCOUNTER — Ambulatory Visit (INDEPENDENT_AMBULATORY_CARE_PROVIDER_SITE_OTHER): Payer: Medicare Other

## 2022-03-06 ENCOUNTER — Ambulatory Visit: Payer: Medicare Other | Admitting: Cardiology

## 2022-03-06 VITALS — BP 126/78 | HR 75 | Ht 64.0 in | Wt 137.2 lb

## 2022-03-06 DIAGNOSIS — R079 Chest pain, unspecified: Secondary | ICD-10-CM

## 2022-03-06 DIAGNOSIS — R918 Other nonspecific abnormal finding of lung field: Secondary | ICD-10-CM

## 2022-03-06 DIAGNOSIS — I471 Supraventricular tachycardia, unspecified: Secondary | ICD-10-CM

## 2022-03-06 DIAGNOSIS — E785 Hyperlipidemia, unspecified: Secondary | ICD-10-CM | POA: Diagnosis not present

## 2022-03-06 DIAGNOSIS — R55 Syncope and collapse: Secondary | ICD-10-CM

## 2022-03-06 NOTE — Patient Instructions (Signed)
Medication Instructions:  Your physician recommends that you continue on your current medications as directed. Please refer to the Current Medication list given to you today.  *If you need a refill on your cardiac medications before your next appointment, please call your pharmacy*  Testing/Procedures: Your physician has requested that you have an echocardiogram. Echocardiography is a painless test that uses sound waves to create images of your heart. It provides your doctor with information about the size and shape of your heart and how well your heart's chambers and valves are working. This procedure takes approximately one hour. There are no restrictions for this procedure.  ZIO XT- Long Term Monitor Instructions   Your physician has requested you wear a ZIO patch monitor for _14_ days.  This is a single patch monitor.   [employer] supplies one patch monitor per enrollment. Additional stickers are not available. Please do not apply patch if you will be having a Nuclear Stress Test, Echocardiogram, Cardiac CT, MRI, or Chest Xray during the period you would be wearing the monitor. The patch cannot be worn during these tests. You cannot remove and re-apply the ZIO XT patch monitor.  Your ZIO patch monitor will be sent Fed Ex from [employer] directly to your home address. It may take 3-5 days to receive your monitor after you have been enrolled.  Once you have received your monitor, please review the enclosed instructions. Your monitor has already been registered assigning a specific monitor serial # to you.  Billing and Patient Assistance Program Information   We have supplied [employer] with any of your insurance information on file for billing purposes. [employer] offers a sliding scale Patient Assistance Program for patients that do not have insurance, or whose insurance does not completely cover the cost of the ZIO monitor.   You must apply for the Patient Assistance Program to qualify for  this discounted rate.     To apply, please call [employer] at [phone], select option 4, then select option 2, and ask to apply for Patient Assistance Program.  [name] will ask your household income, and how many people are in your household.  They will quote your out-of-pocket cost based on that information.  [employer] will also be able to set up a 10-month interest-free payment plan if needed.  Applying the monitor   Shave hair from upper left chest.  Hold abrader disc by orange tab. Rub abrader in 40 strokes over the upper left chest as indicated in your monitor instructions.  Clean area with 4 enclosed alcohol pads. Let dry.  Apply patch as indicated in monitor instructions. Patch will be placed under collarbone on left side of chest with arrow pointing upward.  Rub patch adhesive wings for 2 minutes. Remove white label marked "1". Remove the white label marked "2". Rub patch adhesive wings for 2 additional minutes.  While looking in a mirror, press and release button in center of patch. A small green light will flash 3-4 times. This will be your only indicator that the monitor has been turned on. ?  Do not shower for the first 24 hours. You may shower after the first 24 hours.  Press the button if you feel a symptom. You will hear a small click. Record Date, Time and Symptom in the Patient Logbook.  When you are ready to remove the patch, follow instructions on the last 2 pages of the Patient Logbook. Stick patch monitor onto the last page of Patient Logbook.  Place Patient Logbook in  the blue and white box.  Use locking tab on box and tape box closed securely.  The blue and white box has prepaid postage on it. Please place it in the mailbox as soon as possible. Your physician should have your test results approximately 7 days after the monitor has been mailed back to [facility].  Call [place] at [phone] if you have questions regarding your ZIO XT patch monitor.  Call them immediately if you see an orange light blinking on your monitor.  If your monitor falls off in less than 4 days, contact our Monitor department at [phone]. ?If your monitor becomes loose or falls off after 4 days call [employer] at [phone] for suggestions on securing your monitor.?  Follow-Up: At [facility], you and your health needs are our priority.  As part of our continuing mission to provide you with exceptional heart care, we have created designated Provider Care Teams.  These Care Teams include your primary Cardiologist (physician) and Advanced Practice Providers (APPs -  Physician Assistants and Nurse Practitioners) who all work together to provide you with the care you need, when you need it.  We recommend signing up for the patient portal called "MyChart".  Sign up information is provided on this After Visit Summary.  MyChart is used to connect with patients for Virtual Visits (Telemedicine).  Patients are able to view lab/test results, encounter notes, upcoming appointments, etc.  Non-urgent messages can be sent to your provider as well.   To learn more about what you can do with MyChart, go to [email or web address].    Your next appointment:   6 month(s)  The format for your next appointment:   In Person  Provider:   Donato Heinz, MD {

## 2022-03-06 NOTE — Progress Notes (Unsigned)
Enrolled for [employer] to mail a ZIO XT long term holter monitor to the patients address on file.  

## 2022-03-25 ENCOUNTER — Ambulatory Visit (HOSPITAL_COMMUNITY): Payer: Medicare Other | Attending: Cardiology

## 2022-03-25 DIAGNOSIS — R55 Syncope and collapse: Secondary | ICD-10-CM | POA: Insufficient documentation

## 2022-03-25 LAB — ECHOCARDIOGRAM COMPLETE
Area-P 1/2: 3.37 cm2
S' Lateral: 2.4 cm

## 2022-04-11 DIAGNOSIS — R55 Syncope and collapse: Secondary | ICD-10-CM | POA: Diagnosis not present

## 2022-04-15 DIAGNOSIS — E7800 Pure hypercholesterolemia, unspecified: Secondary | ICD-10-CM | POA: Diagnosis not present

## 2022-04-17 DIAGNOSIS — F5101 Primary insomnia: Secondary | ICD-10-CM | POA: Diagnosis not present

## 2022-04-17 DIAGNOSIS — Z0000 Encounter for general adult medical examination without abnormal findings: Secondary | ICD-10-CM | POA: Diagnosis not present

## 2022-04-17 DIAGNOSIS — E7800 Pure hypercholesterolemia, unspecified: Secondary | ICD-10-CM | POA: Diagnosis not present

## 2022-04-17 DIAGNOSIS — K219 Gastro-esophageal reflux disease without esophagitis: Secondary | ICD-10-CM | POA: Diagnosis not present

## 2022-04-26 ENCOUNTER — Other Ambulatory Visit: Payer: Self-pay | Admitting: *Deleted

## 2022-04-26 MED ORDER — APIXABAN 5 MG PO TABS: 5.0000 mg | ORAL_TABLET | Freq: Two times a day (BID) | ORAL | 3 refills | Status: DC

## 2022-04-29 ENCOUNTER — Telehealth: Payer: Self-pay | Admitting: Cardiology

## 2022-04-29 NOTE — Telephone Encounter (Signed)
Returned the call to the patient. She stated that she was calling Dr. Newman Nickels nurse back for an appointment. Offered to schedule one for her but she stated that she would wait since his nurse understood.

## 2022-04-29 NOTE — Telephone Encounter (Signed)
Patient states she is returning a call.Please advise.

## 2022-04-30 NOTE — Telephone Encounter (Signed)
Spoke to patient, appt scheduled

## 2022-05-07 DIAGNOSIS — H2513 Age-related nuclear cataract, bilateral: Secondary | ICD-10-CM | POA: Diagnosis not present

## 2022-05-07 DIAGNOSIS — H25013 Cortical age-related cataract, bilateral: Secondary | ICD-10-CM | POA: Diagnosis not present

## 2022-05-07 DIAGNOSIS — H25043 Posterior subcapsular polar age-related cataract, bilateral: Secondary | ICD-10-CM | POA: Diagnosis not present

## 2022-05-07 DIAGNOSIS — H18413 Arcus senilis, bilateral: Secondary | ICD-10-CM | POA: Diagnosis not present

## 2022-05-09 ENCOUNTER — Encounter: Payer: Self-pay | Admitting: Cardiology

## 2022-05-09 ENCOUNTER — Ambulatory Visit: Payer: Medicare Other | Attending: Cardiology | Admitting: Cardiology

## 2022-05-09 VITALS — BP 116/68 | HR 75 | Ht 64.0 in | Wt 138.0 lb

## 2022-05-09 DIAGNOSIS — I471 Supraventricular tachycardia, unspecified: Secondary | ICD-10-CM

## 2022-05-09 DIAGNOSIS — R079 Chest pain, unspecified: Secondary | ICD-10-CM | POA: Diagnosis not present

## 2022-05-09 DIAGNOSIS — I480 Paroxysmal atrial fibrillation: Secondary | ICD-10-CM

## 2022-05-09 NOTE — Patient Instructions (Signed)
Medication Instructions:  Your physician recommends that you continue on your current medications as directed. Please refer to the Current Medication list given to you today.  *If you need a refill on your cardiac medications before your next appointment, please call your pharmacy*  Follow-Up: At [facility], you and your health needs are our priority.  As part of our continuing mission to provide you with exceptional heart care, we have created designated Provider Care Teams.  These Care Teams include your primary Cardiologist (physician) and Advanced Practice Providers (APPs -  Physician Assistants and Nurse Practitioners) who all work together to provide you with the care you need, when you need it.  We recommend signing up for the patient portal called "MyChart".  Sign up information is provided on this After Visit Summary.  MyChart is used to connect with patients for Virtual Visits (Telemedicine).  Patients are able to view lab/test results, encounter notes, upcoming appointments, etc.  Non-urgent messages can be sent to your provider as well.   To learn more about what you can do with MyChart, go to [email or web address].    Your next appointment:   As scheduled with Dr. Gardiner Rhyme

## 2022-05-09 NOTE — Progress Notes (Signed)
Cardiology Office Note:    Date:  05/09/2022   ID:  [name], DOB 02/17/1946, MRN [id number]  PCP:  Shirline Frees, MD  Cardiologist:  Donato Heinz, MD  Electrophysiologist:  None   Referring MD: Shirline Frees, MD   No chief complaint on file.   History of Present Illness:    [name] is a 76 y.o. female with a hx of hyperlipidemia who presents for follow-up.  She was referred by Dr. Kenton Kingfisher for evaluation of chest pain and palpitations, initially seen on 09/11/2020.  She reports that she started having chest pain in January 2022.  States that it occurred for 3 to 4 days and then resolved.  Describes dull aching pain in center of her chest, would last for 2 to 3 hours.  Unclear what brings it on, and has not noted any relationship with exertion.  She denies any shortness of breath but reports she feels tired and weak.  Also reports intermittent lightheadedness but denies any syncope.  Reports she is having episodes few times per week where she feels like her heart is racing.  Reports lightheadedness during episodes.  She denies any lower extremity edema.  Reports BP has been labile, as low as 90s over 50s and as high as 140s over 80s.  She has been taking propranolol but feels very fatigued on the medication.  No smoking history.  Family history includes father had valve replacement in 50s.  Lexiscan Myoview on 09/21/2020 showed normal perfusion, baseline ST depressions in inferior leads that worsened during stress, EF 74%.  Echocardiogram on 10/03/2020 showed normal biventricular function, no significant valvular disease.  Zio patch x7 days on 10/03/2020 showed 72 episodes of SVT, longest lasting 26 seconds.  Calcium score on 01/12/2021 was 187 (71st percentile).  Also with multiple small pulmonary nodules.  Zio patch x14 days on 04/03/2022 showed 7% atrial fibrillation burden, longest episode lasting 9 hours with average rate 70 bpm.  Echocardiogram 03/25/2022 showed normal  biventricular function, no significant valvular disease.  Since last clinic visit, she reports that she is doing okay.  Reports occasional palpitations, occurs 2-3 times per week for short duration.  She is taking Eliquis, denies any bleeding issues.  Denies any chest pain, dyspnea, or lower extremity edema.  She does report some lightheadedness but denies any syncope.  Past Medical History:  Diagnosis Date   Allergy    Arthritis    oa   Cataract of both eyes    Cervical dysplasia    Diarrhea    off and on last 3 months 1 episode today   Dry eyes    Fluid retention    slight leg swelling at times   GERD (gastroesophageal reflux disease)    Hypertension    Legally blind in left eye, as defined in [place]    since birth    Low back pain    Lumbar herniated disc    Numbness and tingling    feet bilat    Osteopenia 06/2015   T score -1.7   Syncope    none recent   Urinary frequency    Urinary hesitancy    Wears glasses     Past Surgical History:  Procedure Laterality Date   COLONOSCOPY     LUMBAR LAMINECTOMY     2009 secondary to ruptured disc;    TOTAL HIP ARTHROPLASTY Left 03/25/2018   Procedure: LEFT TOTAL HIP ARTHROPLASTY ANTERIOR APPROACH;  Surgeon: Gaynelle Arabian, MD;  Location: [facility];  Service: Orthopedics;  Laterality: Left;   TOTAL HIP ARTHROPLASTY Right 05/07/2021   Procedure: TOTAL HIP ARTHROPLASTY ANTERIOR APPROACH;  Surgeon: Gaynelle Arabian, MD;  Location: [facility];  Service: Orthopedics;  Laterality: Right;   TOTAL KNEE ARTHROPLASTY Left 08/02/2015   Procedure: LEFT TOTAL KNEE ARTHROPLASTY;  Surgeon: Gaynelle Arabian, MD;  Location: [facility];  Service: Orthopedics;  Laterality: Left;   UPPER GASTROINTESTINAL ENDOSCOPY     VAGINAL HYSTERECTOMY  1980 or 1981   partial    Current Medications: Current Meds  Medication Sig   ALPRAZolam (XANAX) 0.25 MG tablet Take 0.25 mg by mouth 2 (two) times daily as needed for anxiety.    apixaban (ELIQUIS) 5 MG TABS tablet Take 1  tablet (5 mg total) by mouth 2 (two) times daily.   atorvastatin (LIPITOR) 20 MG tablet Take 1 tablet by mouth once daily   Calcium Carb-Cholecalciferol (CALCIUM 600+D3 PO) Take 1 tablet by mouth 2 (two) times daily.   Cholecalciferol (VITAMIN D3) 50 MCG (2000 UT) capsule Take 2,000 Units by mouth every evening.   estradiol (ESTRACE) 0.5 MG tablet Take 0.5 mg by mouth at bedtime.   ferrous sulfate 325 (65 FE) MG tablet Take 325 mg by mouth daily.   metoprolol succinate (TOPROL-XL) 25 MG 24 hr tablet Take 1/2 (one-half) tablet by mouth once daily   Multiple Vitamin (MULTIVITAMIN WITH MINERALS) TABS tablet Take 1 tablet by mouth daily. Senior Multivitamin   Omega-3 Fatty Acids (FISH OIL) 1000 MG CAPS Take 1,000 mg by mouth 2 (two) times daily.   pantoprazole (PROTONIX) 40 MG tablet Take 40 mg by mouth daily.   traZODone (DESYREL) 50 MG tablet Take 50 mg by mouth at bedtime as needed.   vitamin C (ASCORBIC ACID) 500 MG tablet Take 500 mg by mouth daily.   zinc gluconate 50 MG tablet Take 50 mg by mouth daily.   Current Facility-Administered Medications for the 05/09/22 encounter (Office Visit) with Donato Heinz, MD  Medication   0.9 %  sodium chloride infusion     Allergies:   Patient has no known allergies.   Social History   Socioeconomic History   Marital status: Married    Spouse name: Not on file   Number of children: Not on file   Years of education: Not on file   Highest education level: Not on file  Occupational History   Not on file  Tobacco Use   Smoking status: Never   Smokeless tobacco: Never  Vaping Use   Vaping Use: Never used  Substance and Sexual Activity   Alcohol use: No    Alcohol/week: 0.0 standard drinks of alcohol   Drug use: No   Sexual activity: Yes    Birth control/protection: Surgical    Comment: Hyst-1st intercourse 59 yo-1 partner  Other Topics Concern   Not on file  Social History Narrative   Not on file   Social Determinants of  Health   Financial Resource Strain: Not on file  Food Insecurity: Not on file  Transportation Needs: Not on file  Physical Activity: Not on file  Stress: Not on file  Social Connections: Not on file     Family History: The patient's family history includes Cancer in her father; Colon cancer in her father; Diabetes in her brother and father; Heart disease in her father; Hypertension in her brother. There is no history of Esophageal cancer, Rectal cancer, or Stomach cancer.  ROS:   Please see the history of present illness.  All other systems reviewed and are negative.  EKGs/Labs/Other Studies Reviewed:    The following studies were reviewed today:  Echo 03/22:    1. Left ventricular ejection fraction, by estimation, is 60 to 65%. The  left ventricle has normal function. The left ventricle has no regional  wall motion abnormalities. Left ventricular diastolic parameters are  indeterminate.   2. Right ventricular systolic function is normal. The right ventricular  size is normal. There is normal pulmonary artery systolic pressure.   3. The mitral valve is normal in structure. No evidence of mitral valve  regurgitation. No evidence of mitral stenosis.   4. The aortic valve is normal in structure. Aortic valve regurgitation is  not visualized. No aortic stenosis is present.   Long term monitor 03/22:  Study Highlights   72 episodes of SVT, longest lasting 26 seconds with rate 112 bpm     Patch Wear Time:  7 days and 2 hours (2022-03-11T11:48:10-498 to 2022-03-18T14:57:26-0400)   Patient had a min HR of 53 bpm, max HR of 144 bpm, and avg HR of 69 bpm. Predominant underlying rhythm was Sinus Rhythm. 72 Supraventricular Tachycardia runs occurred, the run with the fastest interval lasting 18 beats with a max rate of 144 bpm, the longest lasting 26.3 secs with an avg rate of 112 bpm. Isolated SVEs were rare (<1.0%), SVE Couplets were rare (<1.0%), and SVE Triplets were rare  (<1.0%). Isolated VEs were rare (<1.0%), and no VE Couplets or VE Triplets were present. 2 patient triggered events, corresponding to sinus rhythm +/- PACs  Lexiscan 09/21/20  Study Highlights ST depressions in inferior leads at rest, worsened during stress, along with <2m horizontal ST depressions in V5/6 The left ventricular ejection fraction is hyperdynamic (>65%). Nuclear stress EF: 74%. The study is normal. This is a low risk study.   EKG:  05/09/22: Normal sinus rhythm, rate 75, low voltage, no ST abnormalities 03/06/2022: Normal sinus rhythm, rate 75, low voltage 08/29/21:NSR, rate 61, poor R wave progression 02/01/21: sinus rhythm, rate 54, low voltage, no st abnormalities 12/13/2020- No EKG ordered  09/11/2020- The ekg ordered demonstrates normal sinus rhythm, rate 71, low voltage, no ST abnormalities  Recent Labs: No results found for requested labs within last 365 days.  Recent Lipid Panel    Component Value Date/Time   CHOL 124 08/29/2021 1039   TRIG 76 08/29/2021 1039   HDL 63 08/29/2021 1039   CHOLHDL 2.0 08/29/2021 1039   CHOLHDL 3 09/15/2009 0954   VLDL 14.4 09/15/2009 0954   LDLCALC 46 08/29/2021 1039   LDLDIRECT 136.7 09/15/2009 0954    Physical Exam:    VS:  BP 116/68   Pulse 75   Ht '5\' 4"'$  (1.626 m)   Wt 138 lb (62.6 kg)   LMP 07/16/1979   SpO2 98%   BMI 23.69 kg/m     Wt Readings from Last 3 Encounters:  05/09/22 138 lb (62.6 kg)  03/06/22 137 lb 3.2 oz (62.2 kg)  08/29/21 142 lb 6.4 oz (64.6 kg)     GVEL:FYBOnourished, well developed in no acute distress HEENT: Normal NECK: No JVD; No carotid bruits LYMPHATICS: No lymphadenopathy CARDIAC:RRR, no murmurs, rubs, gallops RESPIRATORY:  Clear to auscultation without rales, wheezing or rhonchi  ABDOMEN: Soft, non-tender, non-distended MUSCULOSKELETAL:  No edema; No deformity  SKIN: Warm and dry NEUROLOGIC:  Alert and oriented x 3 PSYCHIATRIC:  Normal affect   ASSESSMENT:    1. PAF  (paroxysmal atrial fibrillation) ([place]  2. Chest pain of uncertain etiology   3. SVT (supraventricular tachycardia)   4. Hyperlipidemia, unspecified hyperlipidemia type        PLAN:    Atrial fibrillation:  Zio patch x14 days on 04/03/2022 showed 7% atrial fibrillation burden, longest episode lasting 9 hours with average rate 70 bpm.  CHA2DS2-VASc score 3 (age x2, female).  Echocardiogram 03/25/2022 showed normal biventricular function, no significant valvular disease. -Started Eliquis 5 mg twice daily.  Recent labs at PCP showed normal thyroid function, hemoglobin 13.9, creatinine 0.8 -Continue Toprol-XL 12.5 mg daily  Chest pain: Atypical in description but does have CAD risk factors (age, hyperlipidemia).  Lexiscan Myoview on 09/21/2020 showed normal perfusion, baseline ST depressions in inferior leads that worsened during stress, EF 74%.  Echocardiogram on 10/03/2020 showed normal biventricular function, no significant valvular disease.   -No further cardiac work-up recommended  SVT: Zio patch x7 days on 10/03/2020 showed 72 episodes of SVT, longest lasting 26 seconds.  She has been taking as needed propranolol, stopped propranolol and started Toprol-XL 25 mg daily.  Dose reduced to 12.5 mg daily as reported heart rates in 40s at home and intermittent lightheadedness  Hyperlipidemia: LDL 98 on 12/13/20.  Calcium score on 01/12/2021 was 187 (71st percentile).  Started atorvastatin 20 mg daily.  LDL 46 on 04/15/2022  Pulmonary nodules: Multiple small pulmonary nodules bilaterally on calcium score.  Largest measures 6 mm.  Repeat chest CT was recommended at 6 months.  Repeat chest CT on 07/13/2021 showed 7 mm nodule in right lower lobe that was not included on previous CT. stable 1 chest CT 01/11/2022, optional follow-up at 18 to 24 months  RTC in 6 months    Medication Adjustments/Labs and Tests Ordered: Current medicines are reviewed at length with the patient today.  Concerns regarding  medicines are outlined above.  No orders of the defined types were placed in this encounter.   No orders of the defined types were placed in this encounter.    Patient Instructions  Medication Instructions:  Your physician recommends that you continue on your current medications as directed. Please refer to the Current Medication list given to you today.  *If you need a refill on your cardiac medications before your next appointment, please call your pharmacy*  Follow-Up: At [facility], you and your health needs are our priority.  As part of our continuing mission to provide you with exceptional heart care, we have created designated Provider Care Teams.  These Care Teams include your primary Cardiologist (physician) and Advanced Practice Providers (APPs -  Physician Assistants and Nurse Practitioners) who all work together to provide you with the care you need, when you need it.  We recommend signing up for the patient portal called "MyChart".  Sign up information is provided on this After Visit Summary.  MyChart is used to connect with patients for Virtual Visits (Telemedicine).  Patients are able to view lab/test results, encounter notes, upcoming appointments, etc.  Non-urgent messages can be sent to your provider as well.   To learn more about what you can do with MyChart, go to [email or web address].    Your next appointment:   As scheduled with Dr. Gardiner Rhyme             Signed, Donato Heinz, MD  05/09/2022 2:06 PM    [place]

## 2022-05-31 DIAGNOSIS — H2512 Age-related nuclear cataract, left eye: Secondary | ICD-10-CM | POA: Diagnosis not present

## 2022-05-31 DIAGNOSIS — H2511 Age-related nuclear cataract, right eye: Secondary | ICD-10-CM | POA: Diagnosis not present

## 2022-05-31 DIAGNOSIS — H2513 Age-related nuclear cataract, bilateral: Secondary | ICD-10-CM | POA: Diagnosis not present

## 2022-06-10 ENCOUNTER — Other Ambulatory Visit: Payer: Self-pay | Admitting: Cardiology

## 2022-06-10 DIAGNOSIS — R319 Hematuria, unspecified: Secondary | ICD-10-CM | POA: Diagnosis not present

## 2022-06-10 DIAGNOSIS — R300 Dysuria: Secondary | ICD-10-CM | POA: Diagnosis not present

## 2022-06-14 DIAGNOSIS — R399 Unspecified symptoms and signs involving the genitourinary system: Secondary | ICD-10-CM | POA: Diagnosis not present

## 2022-06-14 DIAGNOSIS — F419 Anxiety disorder, unspecified: Secondary | ICD-10-CM | POA: Diagnosis not present

## 2022-06-14 DIAGNOSIS — R319 Hematuria, unspecified: Secondary | ICD-10-CM | POA: Diagnosis not present

## 2022-06-20 DIAGNOSIS — R399 Unspecified symptoms and signs involving the genitourinary system: Secondary | ICD-10-CM | POA: Diagnosis not present

## 2022-06-28 DIAGNOSIS — H2512 Age-related nuclear cataract, left eye: Secondary | ICD-10-CM | POA: Diagnosis not present

## 2022-06-28 DIAGNOSIS — H2513 Age-related nuclear cataract, bilateral: Secondary | ICD-10-CM | POA: Diagnosis not present

## 2022-08-01 DIAGNOSIS — Z9181 History of falling: Secondary | ICD-10-CM | POA: Diagnosis not present

## 2022-08-01 DIAGNOSIS — R0781 Pleurodynia: Secondary | ICD-10-CM | POA: Diagnosis not present

## 2022-08-06 DIAGNOSIS — H524 Presbyopia: Secondary | ICD-10-CM | POA: Diagnosis not present

## 2022-08-22 DIAGNOSIS — K080 Exfoliation of teeth due to systemic causes: Secondary | ICD-10-CM | POA: Diagnosis not present

## 2022-08-29 DIAGNOSIS — Z96643 Presence of artificial hip joint, bilateral: Secondary | ICD-10-CM | POA: Diagnosis not present

## 2022-08-29 DIAGNOSIS — M545 Low back pain, unspecified: Secondary | ICD-10-CM | POA: Diagnosis not present

## 2022-08-29 DIAGNOSIS — M25552 Pain in left hip: Secondary | ICD-10-CM | POA: Diagnosis not present

## 2022-08-29 DIAGNOSIS — I480 Paroxysmal atrial fibrillation: Secondary | ICD-10-CM | POA: Diagnosis not present

## 2022-09-01 NOTE — Progress Notes (Unsigned)
Cardiology Office Note:    Date:  09/02/2022   ID:  [name], DOB [birth year], MRN [id number]  PCP:  Shirline Frees, MD  Cardiologist:  Donato Heinz, MD  Electrophysiologist:  None   Referring MD: Shirline Frees, MD   Chief Complaint  Patient presents with   Atrial Fibrillation    History of Present Illness:    [name] is a 77 y.o. female with a hx of hyperlipidemia who presents for follow-up.  She was referred by Dr. Kenton Kingfisher for evaluation of chest pain and palpitations, initially seen on 09/11/2020.  She reports that she started having chest pain in January 2022.  States that it occurred for 3 to 4 days and then resolved.  Describes dull aching pain in center of her chest, would last for 2 to 3 hours.  Unclear what brings it on, and has not noted any relationship with exertion.  She denies any shortness of breath but reports she feels tired and weak.  Also reports intermittent lightheadedness but denies any syncope.  Reports she is having episodes few times per week where she feels like her heart is racing.  Reports lightheadedness during episodes.  She denies any lower extremity edema.  Reports BP has been labile, as low as 90s over 50s and as high as 140s over 80s.  She has been taking propranolol but feels very fatigued on the medication.  No smoking history.  Family history includes father had valve replacement in 88s.  Lexiscan Myoview on 09/21/2020 showed normal perfusion, baseline ST depressions in inferior leads that worsened during stress, EF 74%.  Echocardiogram on 10/03/2020 showed normal biventricular function, no significant valvular disease.  Zio patch x7 days on 10/03/2020 showed 72 episodes of SVT, longest lasting 26 seconds.  Calcium score on 01/12/2021 was 187 (71st percentile).  Also with multiple small pulmonary nodules.  Zio patch x14 days on 04/03/2022 showed 7% atrial fibrillation burden, longest episode lasting 9 hours with average rate 70 bpm.   Echocardiogram 03/25/2022 showed normal biventricular function, no significant valvular disease.  Since last clinic visit, she reports she is doing okay.  Recently has been having more frequent palpitations, occurring 3-4 times per week, lasts less than an hour.  Heart rate gets up to 80s during episodes.  She is taking Eliquis, denies any bleeding issues.  She denies any chest pain, dyspnea, lightheadedness, syncope, or lower extremity edema.   Past Medical History:  Diagnosis Date   Allergy    Arthritis    oa   Cataract of both eyes    Cervical dysplasia    Diarrhea    off and on last 3 months 1 episode today   Dry eyes    Fluid retention    slight leg swelling at times   GERD (gastroesophageal reflux disease)    Hypertension    Legally blind in left eye, as defined in [place]    since birth    Low back pain    Lumbar herniated disc    Numbness and tingling    feet bilat    Osteopenia 06/2015   T score -1.7   Syncope    none recent   Urinary frequency    Urinary hesitancy    Wears glasses     Past Surgical History:  Procedure Laterality Date   COLONOSCOPY     LUMBAR LAMINECTOMY     2009 secondary to ruptured disc;    TOTAL HIP ARTHROPLASTY Left 03/25/2018   Procedure: LEFT  TOTAL HIP ARTHROPLASTY ANTERIOR APPROACH;  Surgeon: Gaynelle Arabian, MD;  Location: [facility];  Service: Orthopedics;  Laterality: Left;   TOTAL HIP ARTHROPLASTY Right 05/07/2021   Procedure: TOTAL HIP ARTHROPLASTY ANTERIOR APPROACH;  Surgeon: Gaynelle Arabian, MD;  Location: [facility];  Service: Orthopedics;  Laterality: Right;   TOTAL KNEE ARTHROPLASTY Left 08/02/2015   Procedure: LEFT TOTAL KNEE ARTHROPLASTY;  Surgeon: Gaynelle Arabian, MD;  Location: [facility];  Service: Orthopedics;  Laterality: Left;   UPPER GASTROINTESTINAL ENDOSCOPY     VAGINAL HYSTERECTOMY  1980 or 1981   partial    Current Medications: Current Meds  Medication Sig   ALPRAZolam (XANAX) 0.25 MG tablet Take 0.25 mg by mouth 2 (two) times  daily as needed for anxiety.    apixaban (ELIQUIS) 5 MG TABS tablet Take 1 tablet (5 mg total) by mouth 2 (two) times daily.   atorvastatin (LIPITOR) 20 MG tablet Take 1 tablet by mouth once daily   Calcium Carb-Cholecalciferol (CALCIUM 600+D3 PO) Take 1 tablet by mouth 2 (two) times daily.   Cholecalciferol (VITAMIN D3) 50 MCG (2000 UT) capsule Take 2,000 Units by mouth every evening.   estradiol (ESTRACE) 0.5 MG tablet Take 0.5 mg by mouth at bedtime.   ferrous sulfate 325 (65 FE) MG tablet Take 325 mg by mouth daily.   metoprolol succinate (TOPROL-XL) 25 MG 24 hr tablet Take 1/2 (one-half) tablet by mouth once daily   Multiple Vitamin (MULTIVITAMIN WITH MINERALS) TABS tablet Take 1 tablet by mouth daily. Senior Multivitamin   Omega-3 Fatty Acids (FISH OIL) 1000 MG CAPS Take 1,000 mg by mouth 2 (two) times daily.   pantoprazole (PROTONIX) 40 MG tablet Take 40 mg by mouth daily.   traZODone (DESYREL) 50 MG tablet Take 50 mg by mouth at bedtime as needed.   vitamin C (ASCORBIC ACID) 500 MG tablet Take 500 mg by mouth daily.   zinc gluconate 50 MG tablet Take 50 mg by mouth daily.   Current Facility-Administered Medications for the 09/02/22 encounter (Office Visit) with Donato Heinz, MD  Medication   0.9 %  sodium chloride infusion     Allergies:   Patient has no known allergies.   Social History   Socioeconomic History   Marital status: Married    Spouse name: Not on file   Number of children: Not on file   Years of education: Not on file   Highest education level: Not on file  Occupational History   Not on file  Tobacco Use   Smoking status: Never   Smokeless tobacco: Never  Vaping Use   Vaping Use: Never used  Substance and Sexual Activity   Alcohol use: No    Alcohol/week: 0.0 standard drinks of alcohol   Drug use: No   Sexual activity: Yes    Birth control/protection: Surgical    Comment: Hyst-1st intercourse 18 yo-1 partner  Other Topics Concern   Not on  file  Social History Narrative   Not on file   Social Determinants of Health   Financial Resource Strain: Not on file  Food Insecurity: Not on file  Transportation Needs: Not on file  Physical Activity: Not on file  Stress: Not on file  Social Connections: Not on file     Family History: The patient's family history includes Cancer in her father; Colon cancer in her father; Diabetes in her brother and father; Heart disease in her father; Hypertension in her brother. There is no history of Esophageal cancer, Rectal cancer, or  Stomach cancer.  ROS:   Please see the history of present illness.     All other systems reviewed and are negative.  EKGs/Labs/Other Studies Reviewed:    The following studies were reviewed today:  Echo 03/22:    1. Left ventricular ejection fraction, by estimation, is 60 to 65%. The  left ventricle has normal function. The left ventricle has no regional  wall motion abnormalities. Left ventricular diastolic parameters are  indeterminate.   2. Right ventricular systolic function is normal. The right ventricular  size is normal. There is normal pulmonary artery systolic pressure.   3. The mitral valve is normal in structure. No evidence of mitral valve  regurgitation. No evidence of mitral stenosis.   4. The aortic valve is normal in structure. Aortic valve regurgitation is  not visualized. No aortic stenosis is present.   Long term monitor 03/22:  Study Highlights   72 episodes of SVT, longest lasting 26 seconds with rate 112 bpm     Patch Wear Time:  7 days and 2 hours (2022-03-11T11:48:10-498 to 2022-03-18T14:57:26-0400)   Patient had a min HR of 53 bpm, max HR of 144 bpm, and avg HR of 69 bpm. Predominant underlying rhythm was Sinus Rhythm. 72 Supraventricular Tachycardia runs occurred, the run with the fastest interval lasting 18 beats with a max rate of 144 bpm, the longest lasting 26.3 secs with an avg rate of 112 bpm. Isolated SVEs were  rare (<1.0%), SVE Couplets were rare (<1.0%), and SVE Triplets were rare (<1.0%). Isolated VEs were rare (<1.0%), and no VE Couplets or VE Triplets were present. 2 patient triggered events, corresponding to sinus rhythm +/- PACs  Lexiscan 09/21/20  Study Highlights ST depressions in inferior leads at rest, worsened during stress, along with <78m horizontal ST depressions in V5/6 The left ventricular ejection fraction is hyperdynamic (>65%). Nuclear stress EF: 74%. The study is normal. This is a low risk study.   EKG:  09/02/22: sinus bradycardia, rate 57, poor R wave progression 05/09/22: Normal sinus rhythm, rate 75, low voltage, no ST abnormalities 03/06/2022: Normal sinus rhythm, rate 75, low voltage 08/29/21:NSR, rate 61, poor R wave progression 02/01/21: sinus rhythm, rate 54, low voltage, no st abnormalities 12/13/2020- No EKG ordered  09/11/2020- The ekg ordered demonstrates normal sinus rhythm, rate 71, low voltage, no ST abnormalities  Recent Labs: No results found for requested labs within last 365 days.  Recent Lipid Panel    Component Value Date/Time   CHOL 124 08/29/2021 1039   TRIG 76 08/29/2021 1039   HDL 63 08/29/2021 1039   CHOLHDL 2.0 08/29/2021 1039   CHOLHDL 3 09/15/2009 0954   VLDL 14.4 09/15/2009 0954   LDLCALC 46 08/29/2021 1039   LDLDIRECT 136.7 09/15/2009 0954    Physical Exam:    VS:  BP 112/66 (BP Location: Left Arm, Patient Position: Sitting, Cuff Size: Normal)   Pulse (!) 57   Ht 5' 4"$  (1.626 m)   Wt 142 lb (64.4 kg)   LMP 07/16/1979   SpO2 95%   BMI 24.37 kg/m     Wt Readings from Last 3 Encounters:  09/02/22 142 lb (64.4 kg)  05/09/22 138 lb (62.6 kg)  03/06/22 137 lb 3.2 oz (62.2 kg)     [id number], well developed in no acute distress HEENT: Normal NECK: No JVD; No carotid bruits LYMPHATICS: No lymphadenopathy CARDIAC:RRR, no murmurs, rubs, gallops RESPIRATORY:  Clear to auscultation without rales, wheezing or rhonchi   ABDOMEN: Soft, non-tender, non-distended MUSCULOSKELETAL:  No edema; No deformity  SKIN: Warm and dry NEUROLOGIC:  Alert and oriented x 3 PSYCHIATRIC:  Normal affect   ASSESSMENT:    1. PAF (paroxysmal atrial fibrillation) (HCC)   2. Chest pain of uncertain etiology   3. SVT (supraventricular tachycardia)   4. Hyperlipidemia, unspecified hyperlipidemia type      PLAN:    Atrial fibrillation:  Zio patch x14 days on 04/03/2022 showed 7% atrial fibrillation burden, longest episode lasting 9 hours with average rate 70 bpm.  CHA2DS2-VASc score 3 (age x2, female).  Echocardiogram 03/25/2022 showed normal biventricular function, no significant valvular disease. -Continue Eliquis 5 mg twice daily.  She does not wish to take anticoagulation long-term and is interested in [place] device. Will refer to Dr Quentin Ore for [facility] evaluation.  She is also having more frequent episodes of A-fib, would also recommend evaluation for ablation -Continue Toprol-XL 12.5 mg daily  Chest pain: Atypical in description but does have CAD risk factors (age, hyperlipidemia).  Lexiscan Myoview on 09/21/2020 showed normal perfusion, baseline ST depressions in inferior leads that worsened during stress, EF 74%.  Echocardiogram on 10/03/2020 showed normal biventricular function, no significant valvular disease.   -No further cardiac work-up recommended  SVT: Zio patch x7 days on 10/03/2020 showed 72 episodes of SVT, longest lasting 26 seconds.  She has been taking as needed propranolol, stopped propranolol and started Toprol-XL 25 mg daily.  Dose reduced to 12.5 mg daily as reported heart rates in 40s at home and intermittent lightheadedness  Hyperlipidemia: LDL 98 on 12/13/20.  Calcium score on 01/12/2021 was 187 (71st percentile).  Started atorvastatin 20 mg daily.  LDL 46 on 04/15/2022  Pulmonary nodules: Multiple small pulmonary nodules bilaterally on calcium score.  Largest measures 6 mm.  Repeat chest CT was recommended at  6 months.  Repeat chest CT on 07/13/2021 showed 7 mm nodule in right lower lobe that was not included on previous CT. Stable chest CT 01/11/2022, optional follow-up at 18 to 24 months  RTC in 6 months    Medication Adjustments/Labs and Tests Ordered: Current medicines are reviewed at length with the patient today.  Concerns regarding medicines are outlined above.  Orders Placed This Encounter  Procedures   Ambulatory referral to Cardiac Electrophysiology   EKG 12-Lead    No orders of the defined types were placed in this encounter.    Patient Instructions  Medication Instructions:  Your physician recommends that you continue on your current medications as directed. Please refer to the Current Medication list given to you today.  *If you need a refill on your cardiac medications before your next appointment, please call your pharmacy*  Follow-Up: At [facility], you and your health needs are our priority.  As part of our continuing mission to provide you with exceptional heart care, we have created designated Provider Care Teams.  These Care Teams include your primary Cardiologist (physician) and Advanced Practice Providers (APPs -  Physician Assistants and Nurse Practitioners) who all work together to provide you with the care you need, when you need it.  We recommend signing up for the patient portal called "MyChart".  Sign up information is provided on this After Visit Summary.  MyChart is used to connect with patients for Virtual Visits (Telemedicine).  Patients are able to view lab/test results, encounter notes, upcoming appointments, etc.  Non-urgent messages can be sent to your provider as well.   To learn more about what you can do with MyChart, go to [email or web address].  Your next appointment:   6 month(s)  Provider:   Donato Heinz, MD     Other Instructions You have been referred to: Dr. Lambert-electrophysiologist        Signed, Donato Heinz, MD  09/02/2022 9:56 AM    [place]

## 2022-09-02 ENCOUNTER — Ambulatory Visit: Payer: Medicare Other | Attending: Cardiology | Admitting: Cardiology

## 2022-09-02 ENCOUNTER — Encounter: Payer: Self-pay | Admitting: Cardiology

## 2022-09-02 VITALS — BP 112/66 | HR 57 | Ht 64.0 in | Wt 142.0 lb

## 2022-09-02 DIAGNOSIS — I471 Supraventricular tachycardia, unspecified: Secondary | ICD-10-CM | POA: Diagnosis not present

## 2022-09-02 DIAGNOSIS — E785 Hyperlipidemia, unspecified: Secondary | ICD-10-CM

## 2022-09-02 DIAGNOSIS — R079 Chest pain, unspecified: Secondary | ICD-10-CM

## 2022-09-02 DIAGNOSIS — I480 Paroxysmal atrial fibrillation: Secondary | ICD-10-CM

## 2022-09-02 DIAGNOSIS — Z1231 Encounter for screening mammogram for malignant neoplasm of breast: Secondary | ICD-10-CM | POA: Diagnosis not present

## 2022-09-02 NOTE — Patient Instructions (Signed)
Medication Instructions:  Your physician recommends that you continue on your current medications as directed. Please refer to the Current Medication list given to you today.  *If you need a refill on your cardiac medications before your next appointment, please call your pharmacy*  Follow-Up: At [facility], you and your health needs are our priority.  As part of our continuing mission to provide you with exceptional heart care, we have created designated Provider Care Teams.  These Care Teams include your primary Cardiologist (physician) and Advanced Practice Providers (APPs -  Physician Assistants and Nurse Practitioners) who all work together to provide you with the care you need, when you need it.  We recommend signing up for the patient portal called "MyChart".  Sign up information is provided on this After Visit Summary.  MyChart is used to connect with patients for Virtual Visits (Telemedicine).  Patients are able to view lab/test results, encounter notes, upcoming appointments, etc.  Non-urgent messages can be sent to your provider as well.   To learn more about what you can do with MyChart, go to [email or web address].    Your next appointment:   6 month(s)  Provider:   Donato Heinz, MD     Other Instructions You have been referred to: Dr. Lambert-electrophysiologist

## 2022-09-03 ENCOUNTER — Telehealth: Payer: Self-pay

## 2022-09-03 NOTE — Telephone Encounter (Signed)
Per Dr. Gardiner Rhyme, scheduled the patient for [facility] consult with Dr. Quentin Ore 10/22/2022. She was grateful for call and agreed with plan.

## 2022-09-16 NOTE — Progress Notes (Unsigned)
Electrophysiology Office Note:    Date:  09/17/2022   ID:  CATHELINE Lane, DOB 03-28-1946, MRN [id number]  [place] Cardiologist:  Donato Heinz, MD  [place] Electrophysiologist:  None   Referring MD: Shirline Frees, MD   Chief Complaint: AF  History of Present Illness:    [name] is a 77 y.o. female who I am seeing today for an evaluation of AF at the request of Dr Gardiner Rhyme.   At the recent appointment the patient reported more frequent AF episodes and inquired re: ablation therapy.  Zio patch in 03/2022 showed a 7% AF burden. On eliquis for stroke ppx but prefers a strategy of stroke risk reduction that avoids long term AC exposure.         Their past medical, social and family history was reveiwed.   ROS:   Please see the history of present illness.    All other systems reviewed and are negative.  EKGs/Labs/Other Studies Reviewed:    The following studies were reviewed today:  ***  EKG:  The ekg ordered today demonstrates ***   Physical Exam:    VS:  LMP 07/16/1979     Wt Readings from Last 3 Encounters:  09/02/22 142 lb (64.4 kg)  05/09/22 138 lb (62.6 kg)  03/06/22 137 lb 3.2 oz (62.2 kg)     GEN: *** Well nourished, well developed in no acute distress CARDIAC: ***RRR, no murmurs, rubs, gallops RESPIRATORY:  Clear to auscultation without rales, wheezing or rhonchi       ASSESSMENT AND PLAN:    1. PAF (paroxysmal atrial fibrillation) (HCC)     #Atrial fibrillation, paroxysmal Symptomatic.  Discussed treatment options today for their AF including antiarrhythmic drug therapy and ablation. Discussed risks, recovery and likelihood of success. Discussed potential need for repeat ablation procedures and antiarrhythmic drugs after an initial ablation. They wish to proceed with scheduling.  Risk, benefits, and alternatives to EP study and radiofrequency ablation for afib were also discussed in detail today. These risks  include but are not limited to stroke, bleeding, vascular damage, tamponade, perforation, damage to the esophagus, lungs, and other structures, pulmonary vein stenosis, worsening renal function, and death. The patient understands these risk and wishes to proceed.  We will therefore proceed with catheter ablation at the next available time.  Carto, ICE, anesthesia are requested for the procedure.  Will also obtain CT PV protocol prior to the procedure to exclude LAA thrombus and further evaluate atrial anatomy.  -----------------  I have seen [name] in the office today who is being considered for a Watchman left atrial appendage closure device. I believe they will benefit from this procedure given their history of atrial fibrillation, CHA2DS2-VASc score of *** and unadjusted ischemic stroke rate of ***% per year. Unfortunately, the patient is not felt to be a long term anticoagulation candidate secondary to ***. The patient's chart has been reviewed and I feel that they would be a candidate for short term oral anticoagulation after Watchman implant.   It is my belief that after undergoing a LAA closure procedure, [name] will not need long term anticoagulation which eliminates anticoagulation side effects and major bleeding risk.   Procedural risks for the Watchman implant have been reviewed with the patient including a 0.5% risk of stroke, <1% risk of perforation and <1% risk of device embolization. Other risks include bleeding, vascular damage, tamponade, worsening renal function, and death. The patient understands these risk and wishes to  proceed.     The published clinical data on the safety and effectiveness of WATCHMAN include but are not limited to the following: - Holmes DR, Mechele Claude, Sick P et al. for the PROTECT AF Investigators. Percutaneous closure of the left atrial appendage versus warfarin therapy for prevention of stroke in patients with atrial fibrillation: a randomised  non-inferiority trial. Lancet 2009; 374: 534-42. Mechele Claude, Doshi SK, Abelardo Diesel D et al. on behalf of the PROTECT AF Investigators. Percutaneous Left Atrial Appendage Closure for Stroke Prophylaxis in Patients With Atrial Fibrillation 2.3-Year Follow-up of the PROTECT AF (Watchman Left Atrial Appendage System for Embolic Protection in Patients With Atrial Fibrillation) Trial. Circulation 2013; 127:720-729. - Alli O, Doshi S,  Kar S, Reddy VY, Sievert H et al. Quality of Life Assessment in the Randomized PROTECT AF (Percutaneous Closure of the Left Atrial Appendage Versus Warfarin Therapy for Prevention of Stroke in Patients With Atrial Fibrillation) Trial of Patients at Risk for Stroke With Nonvalvular Atrial Fibrillation. J Am Coll Cardiol 2013; P4788364. Vertell Limber DR, Tarri Abernethy, Price M, [place], Sievert H, Doshi S, Huber K, Reddy V. Prospective randomized evaluation of the Watchman left atrial appendage Device in patients with atrial fibrillation versus long-term warfarin therapy; the PREVAIL trial. Journal of the [employer] of Cardiology, Vol. 4, No. 1, 2014, 1-11. - Kar S, Doshi SK, Sadhu A, Horton R, Osorio J et al. Primary outcome evaluation of a next-generation left atrial appendage closure device: results from the PINNACLE FLX trial. Circulation 2021;143(18)1754-1762.    After today's visit with the patient which was dedicated solely for shared decision making visit regarding LAA closure device, the patient decided to proceed with the LAA appendage closure procedure scheduled to be done in the near future at [facility]. Prior to the procedure, I would like to obtain a gated CT scan of the chest with contrast timed for PV/LA visualization.     HAS-BLED score *** Hypertension Yes  Abnormal renal and liver function (Dialysis, transplant, Cr >2.26 mg/dL /Cirrhosis or Bilirubin >2x Normal or AST/ALT/AP >3x Normal) No  Stroke No  Bleeding No  Labile INR (Unstable/high INR)  No  Elderly (>65) Yes  Drugs or alcohol (? 8 drinks/week, anti-plt or NSAID) No   CHA2DS2-VASc Score = 4  The patient's score is based upon: CHF History: 0 HTN History: 1 Diabetes History: 0 Stroke History: 0 Vascular Disease History: 0 Age Score: 2 Gender Score: 1        Signed, Lysbeth Galas T. Quentin Ore, MD, [facility], [facility] 09/17/2022 4:52 AM    Electrophysiology [facility]

## 2022-09-17 ENCOUNTER — Ambulatory Visit: Payer: Medicare Other | Attending: Cardiology | Admitting: Cardiology

## 2022-09-17 ENCOUNTER — Encounter: Payer: Self-pay | Admitting: Cardiology

## 2022-09-17 VITALS — BP 120/78 | HR 72 | Ht 64.0 in | Wt 141.0 lb

## 2022-09-17 DIAGNOSIS — I480 Paroxysmal atrial fibrillation: Secondary | ICD-10-CM

## 2022-09-17 NOTE — Progress Notes (Signed)
Electrophysiology Office Note:    Date:  09/17/2022   ID:  ALEAHA VARMA, DOB 31-Aug-1945, MRN [id number]  [place] Cardiologist:  Donato Heinz, MD  [facility] Electrophysiologist:  Vickie Epley, MD   Referring MD: Shirline Frees, MD   Chief Complaint: AF  History of Present Illness:    [name] is a 77 y.o. female who I am seeing today for an evaluation of AF at the request of Dr Gardiner Rhyme.   At the recent appointment the patient reported more frequent AF episodes and inquired re: ablation therapy.  Zio patch in 03/2022 showed a 7% AF burden. On eliquis for stroke ppx but prefers a strategy of stroke risk reduction that avoids long term AC exposure.   Today, she is accompanied by a family member. She is not sure how often her atrial fibrillation is occurring. Sometimes she is able to tell when she is out of rhythm. Typically she notices harder heart beats, some tightness across her chest, and she "just doesn't feel well." At times she is short of breath.  Typically she does not drink caffeinated beverages. When she does she is sometimes aware of increased palpitations.  She endorses easy bruising on Eliquis 5 mg BID. Her regimen also includes 1/2 tablet of 25 mg metoprolol daily.  We reviewed rate-control strategies at length including antiarrhythmic medications, and catheter ablation. We also discussed the Watchman device.  She denies any peripheral edema, lightheadedness, headaches, syncope, orthopnea, or PND.     Their past medical, social and family history was reveiwed.   ROS:   Please see the history of present illness.    (+) Palpitations (+) Chest tightness (+) Shortness of breath (+) Easy bruising All other systems reviewed and are negative.  EKGs/Labs/Other Studies Reviewed:    The following studies were reviewed today:      Physical Exam:    VS:  BP 120/78   Pulse 72   Ht '5\' 4"'$  (1.626 m)   Wt 141 lb (64 kg)   LMP  07/16/1979   SpO2 96%   BMI 24.20 kg/m     Wt Readings from Last 3 Encounters:  09/17/22 141 lb (64 kg)  09/02/22 142 lb (64.4 kg)  05/09/22 138 lb (62.6 kg)     GEN: Well nourished, well developed in no acute distress CARDIAC: RRR, no murmurs, rubs, gallops RESPIRATORY:  Clear to auscultation without rales, wheezing or rhonchi       ASSESSMENT AND PLAN:    1. PAF (paroxysmal atrial fibrillation) (HCC)     #Atrial fibrillation, paroxysmal Symptomatic.  On Eliquis for stroke prophylaxis.  Discussed treatment options today for their AF including antiarrhythmic drug therapy and ablation. Discussed risks, recovery and likelihood of success. Discussed potential need for repeat ablation procedures and antiarrhythmic drugs after an initial ablation. They wish to proceed with scheduling.  Risk, benefits, and alternatives to EP study and radiofrequency ablation for afib were also discussed in detail today. These risks include but are not limited to stroke, bleeding, vascular damage, tamponade, perforation, damage to the esophagus, lungs, and other structures, pulmonary vein stenosis, worsening renal function, and death. The patient understands these risk and wishes to proceed.  We will therefore proceed with catheter ablation at the next available time.  Carto, ICE, anesthesia are requested for the procedure.  Will also obtain CT PV protocol prior to the procedure to exclude LAA thrombus and further evaluate atrial anatomy.  -----------------  I have seen Jennifer Chalk  Lane in the office today who is being considered for a Watchman left atrial appendage closure device. I believe they will benefit from this procedure given their history of atrial fibrillation, CHA2DS2-VASc score of 4 and unadjusted ischemic stroke rate of 4% per year. Unfortunately, the patient is not felt to be a long term anticoagulation candidate secondary to concerns over elevated bleeding risk and excessive costs. The  patient's chart has been reviewed and I feel that they would be a candidate for short term oral anticoagulation after Watchman implant.   It is my belief that after undergoing a LAA closure procedure, [name] will not need long term anticoagulation which eliminates anticoagulation side effects and major bleeding risk.   Procedural risks for the Watchman implant have been reviewed with the patient including a 0.5% risk of stroke, <1% risk of perforation and <1% risk of device embolization. Other risks include bleeding, vascular damage, tamponade, worsening renal function, and death. The patient understands these risk and wishes to proceed.     The published clinical data on the safety and effectiveness of WATCHMAN include but are not limited to the following: - Holmes DR, Mechele Claude, Sick P et al. for the PROTECT AF Investigators. Percutaneous closure of the left atrial appendage versus warfarin therapy for prevention of stroke in patients with atrial fibrillation: a randomised non-inferiority trial. Lancet 2009; 374: 534-42. Mechele Claude, Doshi SK, Abelardo Diesel D et al. on behalf of the PROTECT AF Investigators. Percutaneous Left Atrial Appendage Closure for Stroke Prophylaxis in Patients With Atrial Fibrillation 2.3-Year Follow-up of the PROTECT AF (Watchman Left Atrial Appendage System for Embolic Protection in Patients With Atrial Fibrillation) Trial. Circulation 2013; 127:720-729. - Alli O, Doshi S,  Kar S, Reddy VY, Sievert H et al. Quality of Life Assessment in the Randomized PROTECT AF (Percutaneous Closure of the Left Atrial Appendage Versus Warfarin Therapy for Prevention of Stroke in Patients With Atrial Fibrillation) Trial of Patients at Risk for Stroke With Nonvalvular Atrial Fibrillation. J Am Coll Cardiol 2013; N8865744. Vertell Limber DR, Tarri Abernethy, Price M, [place], Sievert H, Doshi S, Huber K, Reddy V. Prospective randomized evaluation of the Watchman left atrial appendage Device in  patients with atrial fibrillation versus long-term warfarin therapy; the PREVAIL trial. Journal of the [employer] of Cardiology, Vol. 4, No. 1, 2014, 1-11. - Kar S, Doshi SK, Sadhu A, Horton R, Osorio J et al. Primary outcome evaluation of a next-generation left atrial appendage closure device: results from the PINNACLE FLX trial. Circulation 2021;143(18)1754-1762.    After today's visit with the patient which was dedicated solely for shared decision making visit regarding LAA closure device, the patient decided to proceed with the LAA appendage closure procedure scheduled to be done in the near future at [facility]. Prior to the procedure, I would like to obtain a gated CT scan of the chest with contrast timed for PV/LA visualization.     HAS-BLED score 2 Hypertension Yes  Abnormal renal and liver function (Dialysis, transplant, Cr >2.26 mg/dL /Cirrhosis or Bilirubin >2x Normal or AST/ALT/AP >3x Normal) No  Stroke No  Bleeding No  Labile INR (Unstable/high INR) No  Elderly (>65) Yes  Drugs or alcohol (? 8 drinks/week, anti-plt or NSAID) No   CHA2DS2-VASc Score = 4  The patient's score is based upon: CHF History: 0 HTN History: 1 Diabetes History: 0 Stroke History: 0 Vascular Disease History: 0 Age Score: 2 Gender Score: 1    I,Mathew Stumpf,acting as  a scribe for Vickie Epley, MD.,have documented all relevant documentation on the behalf of Vickie Epley, MD,as directed by  Vickie Epley, MD while in the presence of Vickie Epley, MD.  I, Vickie Epley, MD, have reviewed all documentation for this visit. The documentation on 09/17/22 for the exam, diagnosis, procedures, and orders are all accurate and complete.   Signed, Hilton Cork. Quentin Ore, MD, [facility], [facility] 09/17/2022 4:58 PM    Electrophysiology [facility]

## 2022-09-17 NOTE — Patient Instructions (Addendum)
Medication Instructions:  Your physician recommends that you continue on your current medications as directed. Please refer to the Current Medication list given to you today.  *If you need a refill on your cardiac medications before your next appointment, please call your pharmacy*  Lab Work: BMET and CBC on June 24th anytime between 8:00am and 5:00pm. You will meet with April on this same day to review procedure instructions  Testing/Procedures: Your physician has requested that you have cardiac CT. Cardiac computed tomography (CT) is a painless test that uses an x-ray machine to take clear, detailed pictures of your heart. For further information please visit [email or web address]. Please follow instruction sheet as given. We will call you to scheduled your CT scan. It will be done 1 week prior to your Ablation.   Your physician has recommended that you have an ablation. Catheter ablation is a medical procedure used to treat some cardiac arrhythmias (irregular heartbeats). During catheter ablation, a long, thin, flexible tube is put into a blood vessel in your groin (upper thigh), or neck. This tube is called an ablation catheter. It is then guided to your heart through the blood vessel. Radio frequency waves destroy small areas of heart tissue where abnormal heartbeats may cause an arrhythmia to start. Please see the instruction sheet given to you today. You are scheduled for Atrial Fibrillation Ablation on Tuesday, July 9 with Dr. Lars Mage at 10:30am.Please arrive at the Main Entrance A at [facility]: [place], [place] [address] at 8:30 AM    Follow-Up: At [facility], you and your health needs are our priority.  As part of our continuing mission to provide you with exceptional heart care, we have created designated Provider Care Teams.  These Care Teams include your primary Cardiologist (physician) and Advanced Practice Providers (APPs -  Physician Assistants  and Nurse Practitioners) who all work together to provide you with the care you need, when you need it.  We will be contacting you to arrange your follow up

## 2022-09-24 ENCOUNTER — Institutional Professional Consult (permissible substitution): Payer: Medicare Other | Admitting: Cardiology

## 2022-10-22 ENCOUNTER — Institutional Professional Consult (permissible substitution): Payer: Medicare Other | Admitting: Cardiology

## 2022-12-25 IMAGING — CT CT CARDIAC CORONARY ARTERY CALCIUM SCORE
3 series · 14 of 20 positions shown, 15 images · non-contrast
Comparison: None.
COMPARISON: None.

Addendum:
EXAM:
OVER-READ INTERPRETATION  CT CHEST

The following report is an over-read performed by radiologist Dr.
over-read does not include interpretation of cardiac or coronary
anatomy or pathology. The coronary calcium score interpretation by
the cardiologist is attached.
CLINICAL DATA: Cardiovascular Disease Risk stratification
Coronary Calcium Score
TECHNIQUE: A gated, non-contrast computed tomography scan of the heart was
performed using 3mm slice thickness. Axial images were analyzed on a
dedicated workstation. Calcium scoring of the coronary arteries was
performed using the Agatston method.

[Series 2: casc 3.0 bv41 2 bestdiast 70 % · axial · 0.43mm/px · z∈[-100,-40]mm · 4 of 34 slices shown, 5 images]
[im 7/34  vessel]
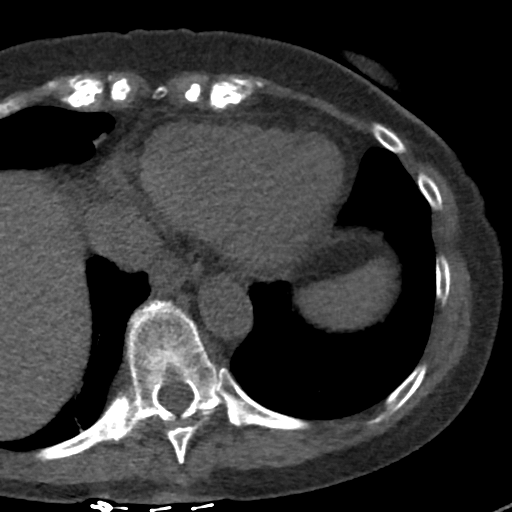
[im 7/34  lung]
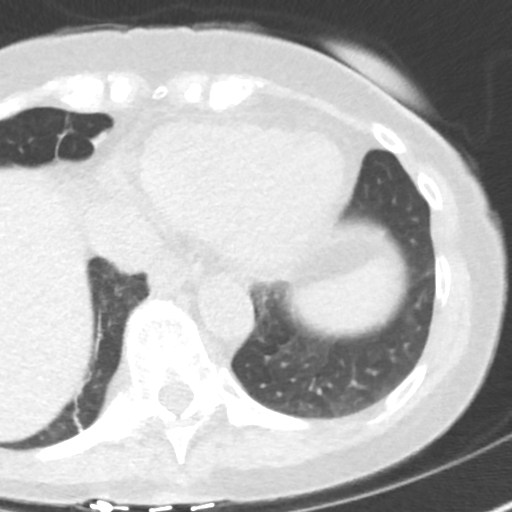
[im 14/34  vessel]
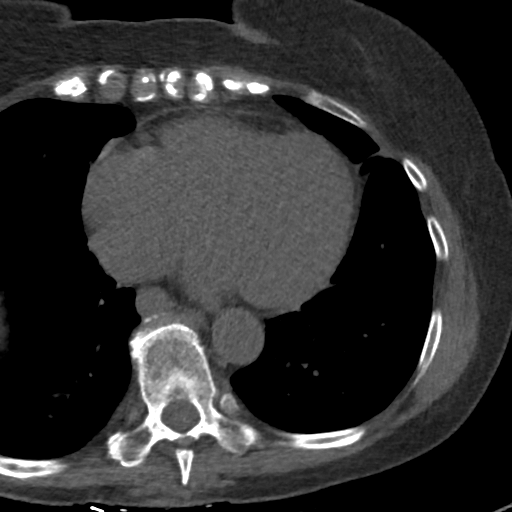
[im 20/34  vessel]
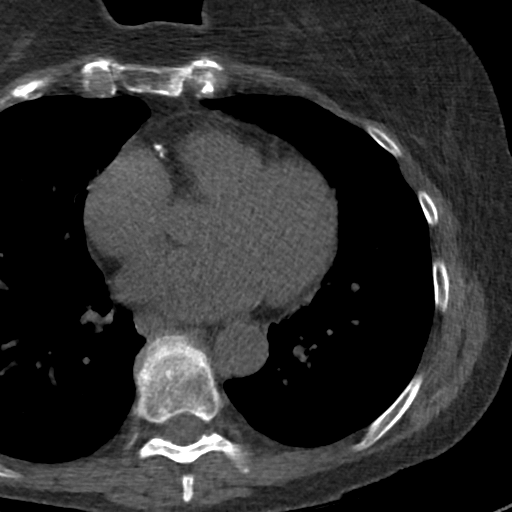
[im 27/34  vessel]
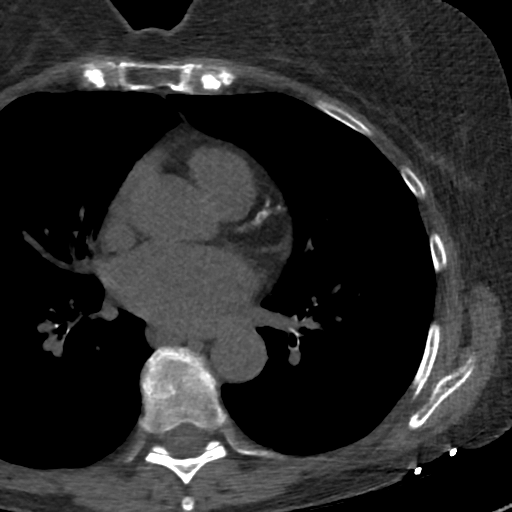

[Series 3: lung 70 % · axial · 0.57mm/px · z∈[-103,-37]mm · 5 of 34 slices shown]
[im 6/34  lung]
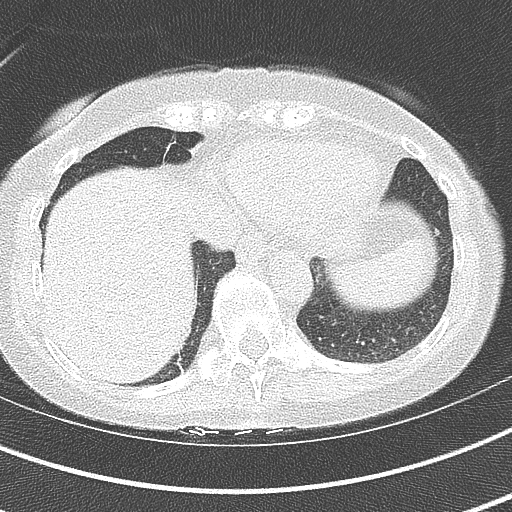
[im 12/34  lung]
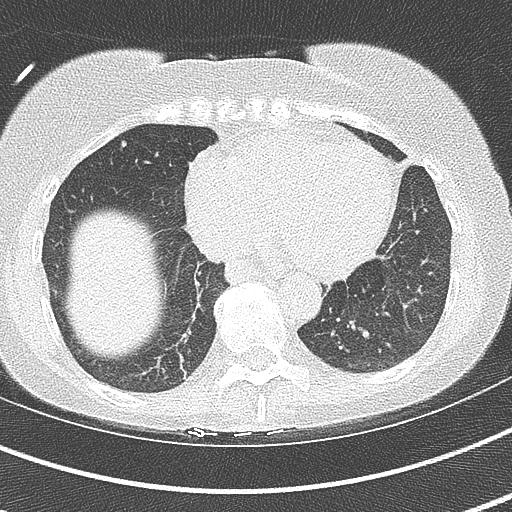
[im 17/34  lung]
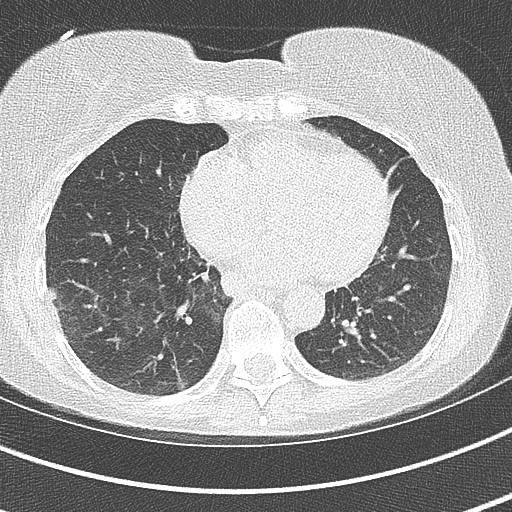
[im 23/34  lung]
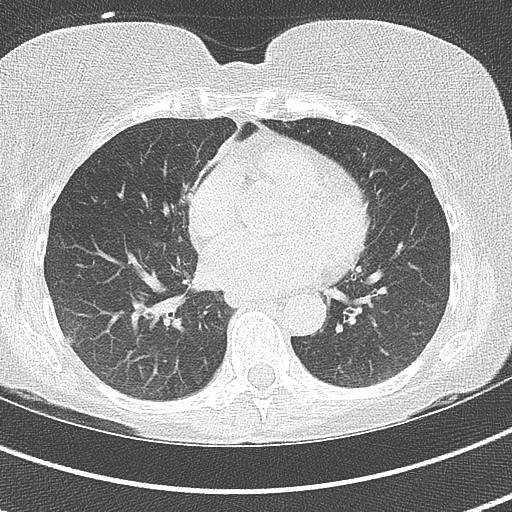
[im 28/34  lung]
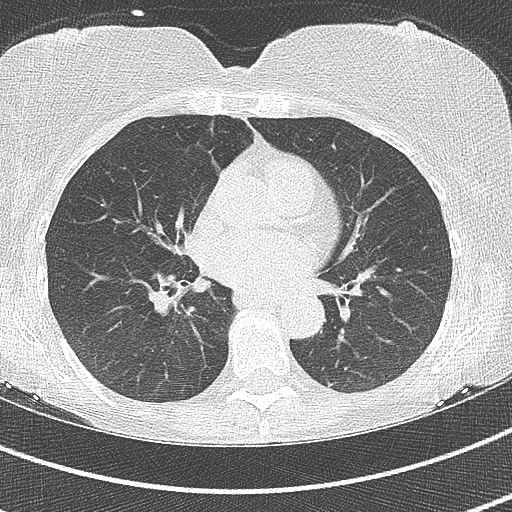

[Series 4: lung st 70 % · axial · 0.57mm/px · z∈[-103,-37]mm · 5 of 34 slices shown]
[im 6/34  lung]
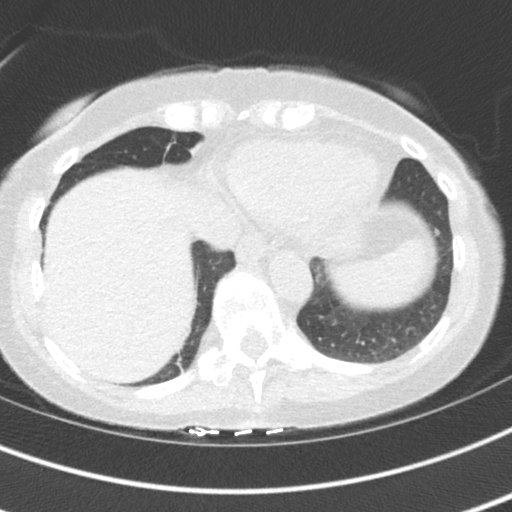
[im 12/34  lung]
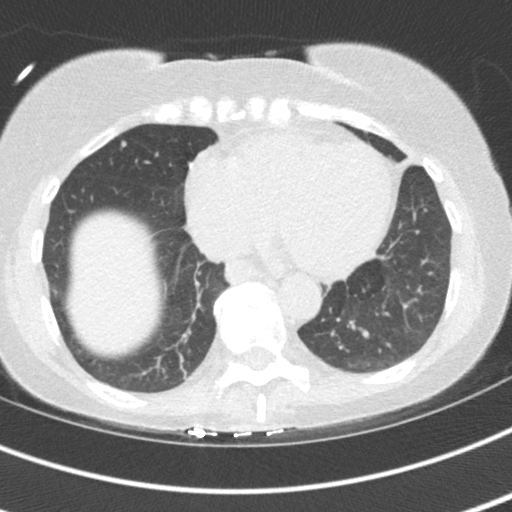
[im 17/34  lung]
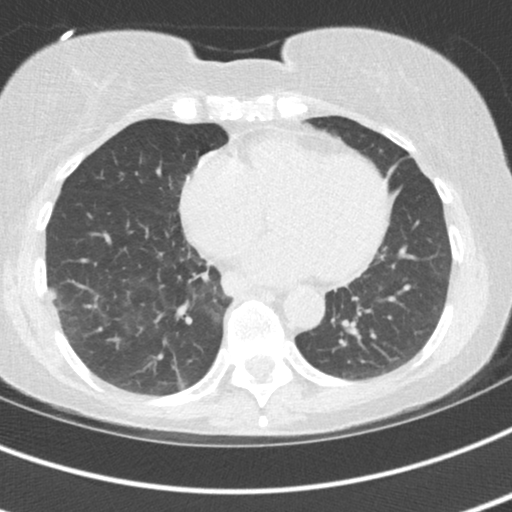
[im 23/34  lung]
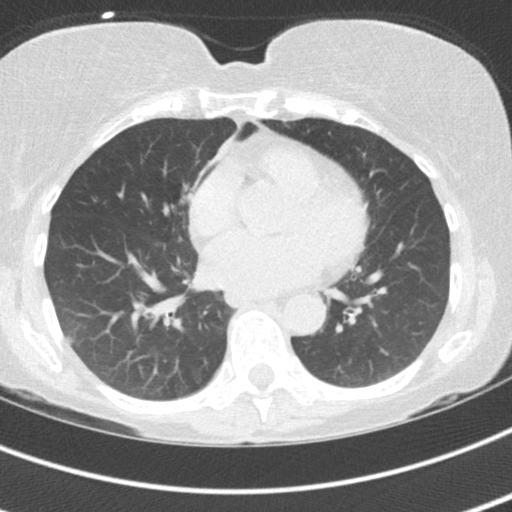
[im 28/34  lung]
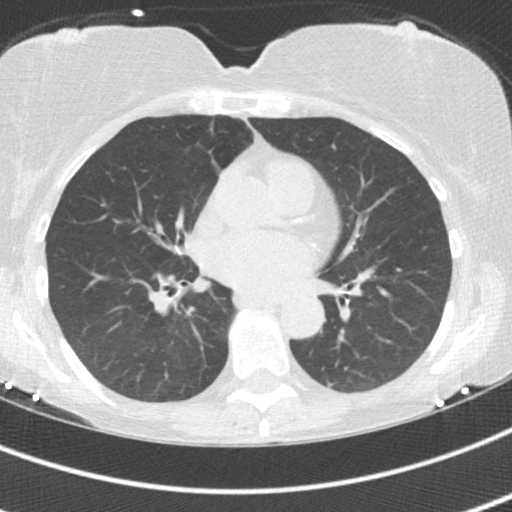

[14 of 20 positions shown; findings below may reference images not displayed]

FINDINGS: Vascular: No significant noncardiac vascular findings.

Mediastinum/Nodes: Visualized mediastinum and hilar regions
demonstrate no lymphadenopathy or masses.

Lungs/Pleura: Small subpleural pulmonary nodules present bilaterally
on series 4. 6 mm right lower lobe on image 19, 3 mm right middle
lobe on image 23, 2 mm lingula on image 8 and 5 mm left lower lobe
on image 31. Potential additional 3 mm left lower lobe on image 29
and subtle 3 mm lingular on image 9. Visualized lungs show no
evidence of pulmonary edema, consolidation, pneumothorax or pleural
fluid.

Upper Abdomen: No acute abnormality.

Musculoskeletal: No chest wall mass or suspicious bone lesions
identified.
IMPRESSION: Multiple small subpleural pulmonary nodules bilaterally. These are
statistically most likely postinflammatory in nature. The largest
measures 6 mm. Non-contrast chest CT at 3-6 months is recommended.
If the nodules are stable at time of repeat CT, then future CT at
18-24 months (from today's scan) is considered optional for low-risk
patients, but is recommended for high-risk patients. This
recommendation follows the consensus statement: Guidelines for
Management of Incidental Pulmonary Nodules Detected on CT Images:
FINDINGS: Coronary arteries: Normal origins.

Coronary Calcium Score:

Left main: 0

Left anterior descending artery:

Left circumflex artery:

Right coronary artery: 141

Total: 187

Percentile: 71st

Pericardium: Normal.

Ascending Aorta: Normal caliber.

Non-cardiac: See separate report from [REDACTED].
IMPRESSION: Coronary calcium score of 187. This was 71st percentile for age-,
race-, and sex-matched controls.



If CAC=0, it is reasonable to withhold statin therapy and reassess
in 5 to 10 years, as long as higher risk conditions are absent
(diabetes mellitus, family history of premature CHD in first degree
relatives (males <55 years; females <65 years), cigarette smoking,
or LDL >=190 mg/dL).

If CAC is 1 to 99, it is reasonable to initiate statin therapy for
patients >=55 years of age.

If CAC is >=100 or >=75th percentile, it is reasonable to initiate
statin therapy at any age.

Cardiology referral should be considered for patients with CAC
scores >=400 or >=75th percentile.

*9180 AHA/ACC/AACVPR/AAPA/ABC/KRISTIVOJ/BJ/CRISTINA ERNESTO/Chanesia/AFTAB/GALLEFOSS/GEARY
Guideline on the Management of Blood Cholesterol: A Report of the
American College of Cardiology/American Heart Association Task Force
on Clinical Practice Guidelines. J Am Coll Cardiol.
4169;73(24):5614-5887.

*** End of Addendum ***
EXAM:
OVER-READ INTERPRETATION  CT CHEST

The following report is an over-read performed by radiologist Dr.
over-read does not include interpretation of cardiac or coronary
anatomy or pathology. The coronary calcium score interpretation by
the cardiologist is attached.
FINDINGS: Vascular: No significant noncardiac vascular findings.

Mediastinum/Nodes: Visualized mediastinum and hilar regions
demonstrate no lymphadenopathy or masses.

Lungs/Pleura: Small subpleural pulmonary nodules present bilaterally
on series 4. 6 mm right lower lobe on image 19, 3 mm right middle
lobe on image 23, 2 mm lingula on image 8 and 5 mm left lower lobe
on image 31. Potential additional 3 mm left lower lobe on image 29
and subtle 3 mm lingular on image 9. Visualized lungs show no
evidence of pulmonary edema, consolidation, pneumothorax or pleural
fluid.

Upper Abdomen: No acute abnormality.

Musculoskeletal: No chest wall mass or suspicious bone lesions
identified.
IMPRESSION: Multiple small subpleural pulmonary nodules bilaterally. These are
statistically most likely postinflammatory in nature. The largest
measures 6 mm. Non-contrast chest CT at 3-6 months is recommended.
If the nodules are stable at time of repeat CT, then future CT at
18-24 months (from today's scan) is considered optional for low-risk
patients, but is recommended for high-risk patients. This
recommendation follows the consensus statement: Guidelines for
Management of Incidental Pulmonary Nodules Detected on CT Images:

## 2023-01-06 ENCOUNTER — Ambulatory Visit: Payer: Medicare Other | Attending: Cardiology

## 2023-01-06 DIAGNOSIS — I480 Paroxysmal atrial fibrillation: Secondary | ICD-10-CM | POA: Diagnosis not present

## 2023-01-07 LAB — CBC WITH DIFFERENTIAL/PLATELET
Basophils Absolute: 0 10*3/uL (ref 0.0–0.2)
Basos: 1 %
EOS (ABSOLUTE): 0.2 10*3/uL (ref 0.0–0.4)
Eos: 2 %
Hematocrit: 41.4 % (ref 34.0–46.6)
Hemoglobin: 13.8 g/dL (ref 11.1–15.9)
Immature Grans (Abs): 0 10*3/uL (ref 0.0–0.1)
Immature Granulocytes: 0 %
Lymphocytes Absolute: 2.1 10*3/uL (ref 0.7–3.1)
Lymphs: 27 %
MCH: 32.3 pg (ref 26.6–33.0)
MCHC: 33.3 g/dL (ref 31.5–35.7)
MCV: 97 fL (ref 79–97)
Monocytes Absolute: 0.7 10*3/uL (ref 0.1–0.9)
Monocytes: 9 %
Neutrophils Absolute: 4.9 10*3/uL (ref 1.4–7.0)
Neutrophils: 61 %
Platelets: 222 10*3/uL (ref 150–450)
RBC: 4.27 x10E6/uL (ref 3.77–5.28)
RDW: 12 % (ref 11.7–15.4)
WBC: 7.9 10*3/uL (ref 3.4–10.8)

## 2023-01-07 LAB — BASIC METABOLIC PANEL WITH GFR
BUN/Creatinine Ratio: 18 (ref 12–28)
BUN: 13 mg/dL (ref 8–27)
CO2: 25 mmol/L (ref 20–29)
Calcium: 9.4 mg/dL (ref 8.7–10.3)
Chloride: 104 mmol/L (ref 96–106)
Creatinine, Ser: 0.74 mg/dL (ref 0.57–1.00)
Glucose: 59 mg/dL — ABNORMAL LOW (ref 70–99)
Potassium: 4.2 mmol/L (ref 3.5–5.2)
Sodium: 143 mmol/L (ref 134–144)
eGFR: 83 mL/min/{1.73_m2}

## 2023-01-13 ENCOUNTER — Telehealth (HOSPITAL_COMMUNITY): Payer: Self-pay | Admitting: Emergency Medicine

## 2023-01-13 NOTE — Telephone Encounter (Signed)
Reaching out to patient to offer assistance regarding upcoming cardiac imaging study; pt verbalizes understanding of appt date/time, parking situation and where to check in, pre-test NPO status and medications ordered, and verified current allergies; name and call back number provided for further questions should they arise Madelynne Lasker RN Navigator Cardiac Imaging [facility] [phone] office [phone] cell 

## 2023-01-14 ENCOUNTER — Ambulatory Visit (HOSPITAL_COMMUNITY)
Admission: RE | Admit: 2023-01-14 | Discharge: 2023-01-14 | Disposition: A | Discharge status: Home / Self Care | Payer: Medicare Other | Source: Ambulatory Visit | Attending: Cardiology | Admitting: Cardiology

## 2023-01-14 DIAGNOSIS — I480 Paroxysmal atrial fibrillation: Secondary | ICD-10-CM | POA: Diagnosis present

## 2023-01-14 MED ORDER — IOHEXOL 350 MG/ML SOLN
95.0000 mL | Freq: Once | INTRAVENOUS | Status: AC | PRN
  Administered 2023-01-14: 95 mL via INTRAVENOUS

## 2023-01-20 ENCOUNTER — Telehealth: Payer: Self-pay

## 2023-01-20 ENCOUNTER — Telehealth: Payer: Self-pay | Admitting: Cardiology

## 2023-01-20 DIAGNOSIS — H02889 Meibomian gland dysfunction of unspecified eye, unspecified eyelid: Secondary | ICD-10-CM | POA: Diagnosis not present

## 2023-01-20 NOTE — Pre-Procedure Instructions (Signed)
Attempted to call patient regarding procedure instructions.  Left voicemail on  the following items: Arrival time 0800 Nothing to eat or drink after midnight No meds AM of procedure Responsible person to drive you home and stay with you for 24 hrs  Have you missed any doses of anti-coagulant Eliquis- should be taken twice a day, if you have missed any doses please let office know.  Don't take dose in the morning.

## 2023-01-20 NOTE — Telephone Encounter (Signed)
Spoke with [name] in the reading room. She is asking to see if CT received. Advise was received.  Already reviewed by Dr Lalla Brothers

## 2023-01-20 NOTE — Telephone Encounter (Signed)
Office is requesting a callback regarding pt's CT results. Please advise

## 2023-01-21 ENCOUNTER — Encounter (HOSPITAL_COMMUNITY)
Admission: RE | Disposition: A | Discharge status: Home / Self Care | Payer: Self-pay | Source: Home / Self Care | Attending: Cardiology

## 2023-01-21 ENCOUNTER — Other Ambulatory Visit (HOSPITAL_COMMUNITY): Payer: Self-pay

## 2023-01-21 ENCOUNTER — Encounter (HOSPITAL_COMMUNITY): Payer: Self-pay | Admitting: Cardiology

## 2023-01-21 ENCOUNTER — Ambulatory Visit (HOSPITAL_BASED_OUTPATIENT_CLINIC_OR_DEPARTMENT_OTHER): Payer: Medicare Other | Admitting: Certified Registered Nurse Anesthetist

## 2023-01-21 ENCOUNTER — Other Ambulatory Visit: Payer: Self-pay

## 2023-01-21 ENCOUNTER — Ambulatory Visit (HOSPITAL_COMMUNITY)
Admission: RE | Admit: 2023-01-21 | Discharge: 2023-01-21 | Disposition: A | Discharge status: Home / Self Care | Payer: Medicare Other | Source: Home / Self Care | Attending: Cardiology | Admitting: Cardiology

## 2023-01-21 ENCOUNTER — Ambulatory Visit (HOSPITAL_COMMUNITY): Payer: Medicare Other | Admitting: Certified Registered Nurse Anesthetist

## 2023-01-21 DIAGNOSIS — Z7901 Long term (current) use of anticoagulants: Secondary | ICD-10-CM | POA: Insufficient documentation

## 2023-01-21 DIAGNOSIS — I480 Paroxysmal atrial fibrillation: Secondary | ICD-10-CM | POA: Diagnosis present

## 2023-01-21 DIAGNOSIS — I10 Essential (primary) hypertension: Secondary | ICD-10-CM

## 2023-01-21 DIAGNOSIS — M1710 Unilateral primary osteoarthritis, unspecified knee: Secondary | ICD-10-CM | POA: Diagnosis not present

## 2023-01-21 DIAGNOSIS — I4891 Unspecified atrial fibrillation: Secondary | ICD-10-CM

## 2023-01-21 HISTORY — PX: ATRIAL FIBRILLATION ABLATION: EP1191

## 2023-01-21 LAB — POCT ACTIVATED CLOTTING TIME: Activated Clotting Time: 269 s

## 2023-01-21 SURGERY — ATRIAL FIBRILLATION ABLATION
Anesthesia: General

## 2023-01-21 MED ORDER — HEPARIN SODIUM (PORCINE) 1000 UNIT/ML IJ SOLN
INTRAMUSCULAR | Status: DC | PRN
  Administered 2023-01-21: 4000 [IU] via INTRAVENOUS
  Administered 2023-01-21: 9000 [IU] via INTRAVENOUS

## 2023-01-21 MED ORDER — ONDANSETRON HCL 4 MG/2ML IJ SOLN
INTRAMUSCULAR | Status: DC | PRN
  Administered 2023-01-21: 4 mg via INTRAVENOUS

## 2023-01-21 MED ORDER — SODIUM CHLORIDE 0.9% FLUSH: 3.0000 mL | INTRAVENOUS | Status: DC | PRN

## 2023-01-21 MED ORDER — SODIUM CHLORIDE 0.9 % IV SOLN: INTRAVENOUS | Status: DC | PRN

## 2023-01-21 MED ORDER — PHENYLEPHRINE HCL-NACL 20-0.9 MG/250ML-% IV SOLN
INTRAVENOUS | Status: DC | PRN
  Administered 2023-01-21: 30 ug/min via INTRAVENOUS

## 2023-01-21 MED ORDER — HEPARIN (PORCINE) IN NACL 1000-0.9 UT/500ML-% IV SOLN
INTRAVENOUS | Status: DC | PRN
  Administered 2023-01-21 (×3): 500 mL

## 2023-01-21 MED ORDER — PANTOPRAZOLE SODIUM 40 MG PO TBEC
40.0000 mg | DELAYED_RELEASE_TABLET | Freq: Every day | ORAL | Status: DC
  Administered 2023-01-21: 40 mg via ORAL
  Filled 2023-01-21: qty 1

## 2023-01-21 MED ORDER — LIDOCAINE 2% (20 MG/ML) 5 ML SYRINGE
INTRAMUSCULAR | Status: DC | PRN
  Administered 2023-01-21: 100 mg via INTRAVENOUS

## 2023-01-21 MED ORDER — APIXABAN 5 MG PO TABS
5.0000 mg | ORAL_TABLET | Freq: Two times a day (BID) | ORAL | Status: DC
  Administered 2023-01-21: 5 mg via ORAL
  Filled 2023-01-21: qty 1

## 2023-01-21 MED ORDER — ROCURONIUM BROMIDE 10 MG/ML (PF) SYRINGE
PREFILLED_SYRINGE | INTRAVENOUS | Status: DC | PRN
  Administered 2023-01-21: 60 mg via INTRAVENOUS

## 2023-01-21 MED ORDER — DEXAMETHASONE SODIUM PHOSPHATE 10 MG/ML IJ SOLN
INTRAMUSCULAR | Status: DC | PRN
  Administered 2023-01-21: 5 mg via INTRAVENOUS

## 2023-01-21 MED ORDER — ONDANSETRON HCL 4 MG/2ML IJ SOLN: 4.0000 mg | Freq: Four times a day (QID) | INTRAMUSCULAR | Status: DC | PRN

## 2023-01-21 MED ORDER — FENTANYL CITRATE (PF) 100 MCG/2ML IJ SOLN
INTRAMUSCULAR | Status: DC | PRN
  Administered 2023-01-21: 50 ug via INTRAVENOUS

## 2023-01-21 MED ORDER — COLCHICINE 0.6 MG PO TABS
0.6000 mg | ORAL_TABLET | Freq: Two times a day (BID) | ORAL | Status: DC
  Administered 2023-01-21: 0.6 mg via ORAL
  Filled 2023-01-21: qty 1

## 2023-01-21 MED ORDER — PROPOFOL 10 MG/ML IV BOLUS
INTRAVENOUS | Status: DC | PRN
  Administered 2023-01-21: 120 mg via INTRAVENOUS

## 2023-01-21 MED ORDER — SUGAMMADEX SODIUM 200 MG/2ML IV SOLN
INTRAVENOUS | Status: DC | PRN
  Administered 2023-01-21: 200 mg via INTRAVENOUS

## 2023-01-21 MED ORDER — EPHEDRINE SULFATE-NACL 50-0.9 MG/10ML-% IV SOSY
PREFILLED_SYRINGE | INTRAVENOUS | Status: DC | PRN
  Administered 2023-01-21 (×2): 2.5 mg via INTRAVENOUS

## 2023-01-21 MED ORDER — PROTAMINE SULFATE 10 MG/ML IV SOLN
INTRAVENOUS | Status: DC | PRN
  Administered 2023-01-21: 30 mg via INTRAVENOUS

## 2023-01-21 MED ORDER — FENTANYL CITRATE (PF) 250 MCG/5ML IJ SOLN: INTRAMUSCULAR | Status: DC | PRN

## 2023-01-21 MED ORDER — HEPARIN SODIUM (PORCINE) 1000 UNIT/ML IJ SOLN
INTRAMUSCULAR | Status: AC
  Filled 2023-01-21: qty 10

## 2023-01-21 MED ORDER — SODIUM CHLORIDE 0.9 % IV SOLN: 250.0000 mL | INTRAVENOUS | Status: DC | PRN

## 2023-01-21 MED ORDER — HEPARIN SODIUM (PORCINE) 1000 UNIT/ML IJ SOLN
INTRAMUSCULAR | Status: DC | PRN
  Administered 2023-01-21: 1000 [IU] via INTRAVENOUS

## 2023-01-21 MED ORDER — COLCHICINE 0.6 MG PO TABS
0.6000 mg | ORAL_TABLET | Freq: Two times a day (BID) | ORAL | 0 refills | Status: DC
  Filled 2023-01-21: qty 10, 5d supply, fill #0

## 2023-01-21 MED ORDER — ACETAMINOPHEN 325 MG PO TABS: 650.0000 mg | ORAL_TABLET | ORAL | Status: DC | PRN

## 2023-01-21 MED ORDER — SODIUM CHLORIDE 0.9% FLUSH: 3.0000 mL | Freq: Two times a day (BID) | INTRAVENOUS | Status: DC

## 2023-01-21 SURGICAL SUPPLY — 18 items
BAG SNAP BAND KOVER 36X36 (MISCELLANEOUS) ×1
CATH ABLAT QDOT MICRO BI TC DF (CATHETERS) ×1
CATH OCTARAY 2.0 F 3-3-3-3-3 (CATHETERS) ×1
CATH S-M CIRCA TEMP PROBE (CATHETERS) ×1
CATH SOUNDSTAR ECO 8FR (CATHETERS) ×1
CATH WEBSTER BI DIR CS D-F CRV (CATHETERS) ×1
CLOSURE PERCLOSE PROSTYLE (VASCULAR PRODUCTS) ×3
COVER SWIFTLINK CONNECTOR (BAG) ×1
PACK EP LATEX FREE (CUSTOM PROCEDURE TRAY) ×1
PACK EP LF (CUSTOM PROCEDURE TRAY) ×1
PAD DEFIB RADIO PHYSIO CONN (PAD) ×1
PATCH CARTO3 (PAD) ×1
SHEATH BAYLIS TRANSSEPTAL 98CM (NEEDLE) ×1
SHEATH CARTO VIZIGO SM CVD (SHEATH) ×1
SHEATH PINNACLE 8F 10CM (SHEATH) ×2
SHEATH PINNACLE 9F 10CM (SHEATH) ×1
SHEATH PROBE COVER 6X72 (BAG) ×1
TUBING SMART ABLATE COOLFLOW (TUBING) ×2

## 2023-01-21 NOTE — Progress Notes (Signed)
Pt ambulated without difficulty or bleeding.  Discharge instructions given to pt and husband  who verbalize understanding and deny further questions.  Discharged home with  husband who will drive and stay with pt x 24 hrs.

## 2023-01-21 NOTE — Anesthesia Procedure Notes (Signed)
Procedure Name: Intubation Date/Time: 01/21/2023 11:33 AM  Performed by: Waynard Edwards, CRNAPre-anesthesia Checklist: Patient identified, Emergency Drugs available, Suction available and Patient being monitored Patient Re-evaluated:Patient Re-evaluated prior to induction Oxygen Delivery Method: Circle system utilized Preoxygenation: Pre-oxygenation with 100% oxygen Induction Type: IV induction Ventilation: Mask ventilation without difficulty Laryngoscope Size: Miller and 2 Grade View: Grade I Tube type: Oral Tube size: 7.0 mm Number of attempts: 1 Airway Equipment and Method: Stylet and Oral airway Placement Confirmation: ETT inserted through vocal cords under direct vision, positive ETCO2 and breath sounds checked- equal and bilateral Secured at: 21 cm Tube secured with: Tape Dental Injury: Teeth and Oropharynx as per pre-operative assessment

## 2023-01-21 NOTE — Progress Notes (Signed)
Report given to Vivian,RN

## 2023-01-21 NOTE — H&P (Signed)
Electrophysiology Office Note:     Date:  01/21/2023    ID:  [name], DOB 18-Jul-1945, MRN [id number]   [facility] Cardiologist:  [name], MD  [facility] Electrophysiologist:  [name], MD    Referring MD: Johny Blamer, MD    Chief Complaint: AF   History of Present Illness:     [name] is a 77 y.o. female who I am seeing today for an evaluation of AF at the request of Dr Bjorn Pippin.    At the recent appointment the patient reported more frequent AF episodes and inquired re: ablation therapy.   Zio patch in 03/2022 showed a 7% AF burden. On eliquis for stroke ppx but prefers a strategy of stroke risk reduction that avoids long term AC exposure.    Today, she is accompanied by a family member. She is not sure how often her atrial fibrillation is occurring. Sometimes she is able to tell when she is out of rhythm. Typically she notices harder heart beats, some tightness across her chest, and she "just doesn't feel well." At times she is short of breath.   Typically she does not drink caffeinated beverages. When she does she is sometimes aware of increased palpitations.   She endorses easy bruising on Eliquis 5 mg BID. Her regimen also includes 1/2 tablet of 25 mg metoprolol daily.   We reviewed rate-control strategies at length including antiarrhythmic medications, and catheter ablation. We also discussed the Watchman device.   She denies any peripheral edema, lightheadedness, headaches, syncope, orthopnea, or PND.   Presents for PVI today. Procedure reviewed.      Objective  Their past medical, social and family history was reveiwed.     ROS:   Please see the history of present illness.    (+) Palpitations (+) Chest tightness (+) Shortness of breath (+) Easy bruising All other systems reviewed and are negative.   EKGs/Labs/Other Studies Reviewed:     The following studies were reviewed today:           Physical Exam:      VS:  BP 147/78   Pulse 61   Ht 5\' 4"  (1.626 m)   Wt 141 lb (64 kg)   LMP 07/16/1979   SpO2 96%   BMI 24.20 kg/m         Wt Readings from Last 3 Encounters:  09/17/22 141 lb (64 kg)  09/02/22 142 lb (64.4 kg)  05/09/22 138 lb (62.6 kg)      GEN: Well nourished, well developed in no acute distress CARDIAC: RRR, no murmurs, rubs, gallops RESPIRATORY:  Clear to auscultation without rales, wheezing or rhonchi          Assessment ASSESSMENT AND PLAN:     1. PAF (paroxysmal atrial fibrillation) (HCC)       #Atrial fibrillation, paroxysmal Symptomatic.  On Eliquis for stroke prophylaxis.   Discussed treatment options today for their AF including antiarrhythmic drug therapy and ablation. Discussed risks, recovery and likelihood of success. Discussed potential need for repeat ablation procedures and antiarrhythmic drugs after an initial ablation. They wish to proceed with scheduling.   Risk, benefits, and alternatives to EP study and radiofrequency ablation for afib were also discussed in detail today. These risks include but are not limited to stroke, bleeding, vascular damage, tamponade, perforation, damage to the esophagus, lungs, and other structures, pulmonary vein stenosis, worsening renal function, and death. The patient understands these risk and wishes to proceed.  We will therefore proceed with catheter ablation at the next available time.  Carto, ICE, anesthesia are requested for the procedure.  Will also obtain CT PV protocol prior to the procedure to exclude LAA thrombus and further evaluate atrial anatomy.   Presents for PVI today. Procedure reviewed.  Signed, Rossie Muskrat. Lalla Brothers, MD, [facility], [facility] 01/21/2023 Electrophysiology [facility]

## 2023-01-21 NOTE — Transfer of Care (Signed)
Immediate Anesthesia Transfer of Care Note  Patient: [name]  Procedure(s) Performed: ATRIAL FIBRILLATION ABLATION  Patient Location: Cath Lab  Anesthesia Type:General  Level of Consciousness: drowsy and patient cooperative  Airway & Oxygen Therapy: Patient Spontanous Breathing and Patient connected to nasal cannula oxygen  Post-op Assessment: Report given to RN and Post -op Vital signs reviewed and stable  Post vital signs: Reviewed and stable  Last Vitals:  Vitals Value Taken Time  BP 135/70 01/21/23 1308  Temp    Pulse 75 01/21/23 1310  Resp 25 01/21/23 1310  SpO2 97 % 01/21/23 1310  Vitals shown include unvalidated device data.  Last Pain:  Vitals:   01/21/23 0831  TempSrc:   PainSc: 0-No pain         Complications: There were no known notable events for this encounter.

## 2023-01-21 NOTE — Progress Notes (Signed)
Report received from Vivian,RN.  Care assumed at this time

## 2023-01-21 NOTE — Anesthesia Preprocedure Evaluation (Addendum)
Anesthesia Evaluation  Patient identified by MRN, date of birth, ID band Patient awake    Reviewed: Allergy & Precautions, H&P , NPO status , Patient's Chart, lab work & pertinent test results  Airway Mallampati: II  TM Distance: >3 FB Neck ROM: Full    Dental no notable dental hx.    Pulmonary neg pulmonary ROS   Pulmonary exam normal breath sounds clear to auscultation       Cardiovascular hypertension, Normal cardiovascular exam+ dysrhythmias Atrial Fibrillation  Rhythm:Irregular Rate:Normal     Neuro/Psych negative neurological ROS  negative psych ROS   GI/Hepatic Neg liver ROS,GERD  ,,  Endo/Other  negative endocrine ROS    Renal/GU negative Renal ROS  negative genitourinary   Musculoskeletal negative musculoskeletal ROS (+)    Abdominal   Peds negative pediatric ROS (+)  Hematology negative hematology ROS (+)   Anesthesia Other Findings   Reproductive/Obstetrics negative OB ROS                             Anesthesia Physical Anesthesia Plan  ASA: 3  Anesthesia Plan: General   Post-op Pain Management: Minimal or no pain anticipated   Induction: Intravenous  PONV Risk Score and Plan: 3 and Ondansetron, Dexamethasone and Treatment may vary due to age or medical condition  Airway Management Planned: Oral ETT  Additional Equipment:   Intra-op Plan:   Post-operative Plan: Extubation in OR  Informed Consent: I have reviewed the patients History and Physical, chart, labs and discussed the procedure including the risks, benefits and alternatives for the proposed anesthesia with the patient or authorized representative who has indicated his/her understanding and acceptance.     Dental advisory given  Plan Discussed with: CRNA and Surgeon  Anesthesia Plan Comments:        Anesthesia Quick Evaluation  

## 2023-01-21 NOTE — Progress Notes (Signed)
Otilio Saber, PA notified of client c/o chest discomfort and in to see client and no new orders noted

## 2023-01-21 NOTE — Anesthesia Postprocedure Evaluation (Signed)
Anesthesia Post Note  Patient: [name]  Procedure(s) Performed: ATRIAL FIBRILLATION ABLATION     Patient location during evaluation: PACU Anesthesia Type: General Level of consciousness: awake and alert Pain management: pain level controlled Vital Signs Assessment: post-procedure vital signs reviewed and stable Respiratory status: spontaneous breathing, nonlabored ventilation, respiratory function stable and patient connected to nasal cannula oxygen Cardiovascular status: blood pressure returned to baseline and stable Postop Assessment: no apparent nausea or vomiting Anesthetic complications: no  There were no known notable events for this encounter.  Last Vitals:  Vitals:   01/21/23 1340 01/21/23 1345  BP: 129/71 127/70  Pulse: 67 67  Resp: 14   Temp: 36.9 C   SpO2: 97% 97%    Last Pain:  Vitals:   01/21/23 1340  TempSrc: Temporal  PainSc: 0-No pain                 Kellis Mcadam S

## 2023-01-21 NOTE — Discharge Instructions (Signed)

## 2023-01-22 ENCOUNTER — Encounter (HOSPITAL_COMMUNITY): Payer: Self-pay | Admitting: Cardiology

## 2023-01-23 ENCOUNTER — Encounter: Payer: Self-pay | Admitting: Physician Assistant

## 2023-01-23 ENCOUNTER — Telehealth: Payer: Self-pay | Admitting: Cardiology

## 2023-01-23 NOTE — Telephone Encounter (Signed)
Pt c/o of Chest Pain: STAT if CP now or developed within 24 hours  1. Are you having CP right now? Yes  2. Are you experiencing any other symptoms (ex. SOB, nausea, vomiting, sweating)? BP dropped to 91/68 HR 76 & 91-66 HR 107  3. How long have you been experiencing CP? Started today about an 1 & 1/2 hours ago  4. Is your CP continuous or coming and going? Unsure  5. Have you taken Nitroglycerin? No  ?  Call transferred to triage due to being STAT.

## 2023-01-23 NOTE — Telephone Encounter (Signed)
Received STAT call to triage.  Patient had an ablation by Dr. Lalla Brothers on 01/21/23.  She reports BP today 91/68, HR 76 and 91/66, HR 107. She feels lightheaded/dizzy and is having "a little chest tightness." Patient states she has not been drinking much water today and is having diarrhea (hx of colitis).   Patient states she took Toprol 12.5mg  to help bring HR down about an hour ago.  Encouraged patient to increase fluid intake and lie down with feet up. Advised on ED precautions regarding CP, BP <90/50, HR <50 or >120 sustained with symptoms. Patient verbalized understanding.

## 2023-01-23 NOTE — Telephone Encounter (Signed)
Error

## 2023-01-24 NOTE — Telephone Encounter (Signed)
Spoke with the patient who states that she is feeling better today. No chest pain. She does still have some lightheadedness but she states that she had this before her procedure and it is no worse than her baseline. She has increased her fluid intake. BP and HR readings from today are 101/66, 66; 111/75, 71; and 93/67, 83. She states that she has not had any more episodes of diarrhea. Advised that if diarrhea comes back she can discontinue colchicine, otherwise continue meds as prescribed.

## 2023-01-27 ENCOUNTER — Other Ambulatory Visit: Payer: Self-pay

## 2023-01-27 ENCOUNTER — Telehealth: Payer: Self-pay

## 2023-01-27 DIAGNOSIS — I480 Paroxysmal atrial fibrillation: Secondary | ICD-10-CM

## 2023-01-27 NOTE — Telephone Encounter (Signed)
Called the patient to discuss Watchman planning s/p ablation last week. She reports that she is having symptomatic afib episodes. She is so fatigued she cannot even bake brownies.  She has been taking Toprol 12.5 mg BID (instead of daily) due to her increased HR (around 105-110), but her BP is low. It has been as low as 83/43.  Upon chart review, she has spoken with Nursing staff a couple times since her ablation. She denies CP at this time, but she does request an evaluation because she is scared to do anything for fear her HR will increase too high. Scheduled her for visit in the AFib Clinic tomorrow.  Due to low BP, instructed her not to double up on her metoprolol until meds can be addressed at her appointment. ER precautions reviewed.   Despite the above, she does wish to proceed with [facility]. Will tentatively schedule her for 04/10/2023. She has several appointments (AFib clinic tomorrow, 1 month ablation visit with Jorja Loa, 9/6 appointment with Dr. Bjorn Pippin) scheduled for close follow-up. She understands her procedure could get postponed if afib cannot be controlled. She was grateful for assistance and agreed with plan.

## 2023-01-28 ENCOUNTER — Ambulatory Visit (HOSPITAL_COMMUNITY)
Admission: RE | Admit: 2023-01-28 | Discharge: 2023-01-28 | Disposition: A | Discharge status: Home / Self Care | Payer: Medicare Other | Source: Ambulatory Visit | Attending: Internal Medicine | Admitting: Internal Medicine

## 2023-01-28 VITALS — BP 126/92 | HR 119 | Ht 64.5 in | Wt 137.2 lb

## 2023-01-28 DIAGNOSIS — I10 Essential (primary) hypertension: Secondary | ICD-10-CM | POA: Diagnosis not present

## 2023-01-28 DIAGNOSIS — I480 Paroxysmal atrial fibrillation: Secondary | ICD-10-CM | POA: Diagnosis present

## 2023-01-28 DIAGNOSIS — I4819 Other persistent atrial fibrillation: Secondary | ICD-10-CM | POA: Diagnosis not present

## 2023-01-28 DIAGNOSIS — I4892 Unspecified atrial flutter: Secondary | ICD-10-CM | POA: Insufficient documentation

## 2023-01-28 DIAGNOSIS — D6869 Other thrombophilia: Secondary | ICD-10-CM | POA: Insufficient documentation

## 2023-01-28 NOTE — Patient Instructions (Signed)
Cardioversion scheduled for: Thursday, July 18th   - Arrive at the [facility] and go to admitting at 12pm   - Do not eat or drink anything after midnight the night prior to your procedure.   - Take all your morning medication (except diabetic medications) with a sip of water prior to arrival.  - You will not be able to drive home after your procedure.    - Do NOT miss any doses of your blood thinner - if you should miss a dose please notify our office immediately.   - If you feel as if you go back into normal rhythm prior to scheduled cardioversion, please notify our office immediately.   If your procedure is canceled in the cardioversion suite you will be charged a cancellation fee.

## 2023-01-28 NOTE — Progress Notes (Signed)
Primary Care Physician: [name], MD Primary Cardiologist: [name], MD Electrophysiologist: [name], MD     Referring Physician: Dr. Veronda Prude [name] is a 77 y.o. female with a history of HLD, elevated coronary calcium score, HTN, and paroxysmal atrial fibrillation who presents for consultation in the [facility]. She underwent Afib ablation on 01/21/23 by Dr. Lalla Brothers. Review of phone notes show patient called on 7/11 with chest pain and diarrhea. Spoke with Gunnar Fusi 7/15 to schedule Watchman procedure and noted she is having symptomatic Afib episodes and hypotension. Scheduled for Watchman procedure on 04/10/23. Patient is on Eliquis 5 mg BID for a CHADS2VASC score of 4.  On evaluation today, she is currently in atrial flutter with RVR. She is s/p Afib ablation on 01/21/23 by Dr. Lalla Brothers. She feels as though she has been out of rhythm since ablation. Some intermittent chest discomfort initially but none now. She did finish colchicine and continues with Protonix. No SOB or trouble swallowing. Leg sites healed without issue. No missed doses of anticoagulant.  Today, she denies symptoms of orthopnea, PND, lower extremity edema, dizziness, presyncope, syncope, snoring, daytime somnolence, bleeding, or neurologic sequela. The patient is tolerating medications without difficulties and is otherwise without complaint today.    she has a BMI of Body mass index is 23.19 kg/m.Marland Kitchen Filed Weights   01/28/23 1446  Weight: 62.2 kg    Current Outpatient Medications  Medication Sig Dispense Refill   ALPRAZolam (XANAX) 0.25 MG tablet Take 0.25 mg by mouth 2 (two) times daily as needed for anxiety.      apixaban (ELIQUIS) 5 MG TABS tablet Take 1 tablet (5 mg total) by mouth 2 (two) times daily. 180 tablet 3   Ascorbic Acid (VITAMIN C) 1000 MG tablet Take 1,000 mg by mouth daily.     atorvastatin (LIPITOR) 20 MG tablet Take 1 tablet  by mouth once daily 90 tablet 3   Calcium Carb-Cholecalciferol (CALCIUM 600+D3 PO) Take 1 tablet by mouth 2 (two) times daily.     Cholecalciferol (VITAMIN D3) 50 MCG (2000 UT) capsule Take 2,000 Units by mouth every evening.     estradiol (ESTRACE) 0.5 MG tablet Take 0.5 mg by mouth at bedtime.     metoprolol succinate (TOPROL-XL) 25 MG 24 hr tablet Take 1/2 (one-half) tablet by mouth once daily 90 tablet 3   Multiple Vitamin (MULTIVITAMIN WITH MINERALS) TABS tablet Take 1 tablet by mouth daily. Senior Multivitamin     Omega-3 Fatty Acids (FISH OIL) 1000 MG CAPS Take 1,000 mg by mouth 2 (two) times daily.     pantoprazole (PROTONIX) 40 MG tablet Take 40 mg by mouth daily.     traZODone (DESYREL) 50 MG tablet Take 50 mg by mouth at bedtime as needed for sleep.     zinc gluconate 50 MG tablet Take 50 mg by mouth daily.     Current Facility-Administered Medications  Medication Dose Route Frequency Provider Last Rate Last Admin   0.9 %  sodium chloride infusion  500 mL Intravenous Once [name], MD        Atrial Fibrillation Management history:  Previous antiarrhythmic drugs: None Previous cardioversions: None Previous ablations: 01/21/23 Anticoagulation history: Eliquis   ROS- All systems are reviewed and negative except as per the HPI above.  Physical Exam: BP (!) 126/92   Pulse (!) 119   Ht 5' 4.5" (1.638 m)   Wt 62.2 kg  LMP 07/16/1979   BMI 23.19 kg/m   GEN: Well nourished, well developed in no acute distress NECK: No JVD; No carotid bruits CARDIAC: Tachycardic rate and rhythm, no murmurs, rubs, gallops RESPIRATORY:  Clear to auscultation without rales, wheezing or rhonchi  ABDOMEN: Soft, non-tender, non-distended EXTREMITIES:  No edema; No deformity   EKG today demonstrates  Vent. rate 119 BPM PR interval * ms QRS duration 78 ms QT/QTcB 408/573 ms P-R-T axes * 49 69  Critical Test Result: Long QTc Accelerated Junctional rhythm Low voltage QRS Prolonged  QT Abnormal ECG When compared with ECG of 21-Jan-2023 13:31, PREVIOUS ECG IS PRESENT  Echo 03/25/22 demonstrated   1. Left ventricular ejection fraction, by estimation, is 65 to 70%. Left  ventricular ejection fraction by 3D volume is 65 %. The left ventricle has  normal function. The left ventricle has no regional wall motion  abnormalities. Left ventricular diastolic   parameters are consistent with Grade I diastolic dysfunction (impaired  relaxation).   2. Right ventricular systolic function is normal. The right ventricular  size is normal. There is normal pulmonary artery systolic pressure.   3. The mitral valve is normal in structure. Trivial mitral valve  regurgitation.   4. The aortic valve is tricuspid. There is mild calcification of the  aortic valve. There is mild thickening of the aortic valve. Aortic valve  regurgitation is trivial. Aortic valve sclerosis/calcification is present,  without any evidence of aortic  stenosis.   5. The inferior vena cava is normal in size with greater than 50%  respiratory variability, suggesting right atrial pressure of 3 mmHg.    ASSESSMENT & PLAN CHA2DS2-VASc Score = 4  The patient's score is based upon: CHF History: 0 HTN History: 1 Diabetes History: 0 Stroke History: 0 Vascular Disease History: 0 Age Score: 2 Gender Score: 1       ASSESSMENT AND PLAN: Paroxysmal Atrial Fibrillation (ICD10:  I48.0) / atrial flutter The patient's CHA2DS2-VASc score is 4, indicating a 4.8% annual risk of stroke.   S/p Afib ablation on 01/21/23 by Dr. Lalla Brothers.  She is in atrial flutter with RVR. She feels as though she has been out of rhythm since getting home from ablation. She has not missed any doses of Eliquis. After discussion, she would like to proceed with scheduling DCCV.   Informed Consent   Shared Decision Making/Informed Consent The risks (stroke, cardiac arrhythmias rarely resulting in the need for a temporary or permanent pacemaker,  skin irritation or burns and complications associated with conscious sedation including aspiration, arrhythmia, respiratory failure and death), benefits (restoration of normal sinus rhythm) and alternatives of a direct current cardioversion were explained in detail to Ms. Zeller and she agrees to proceed.      She has shown me home BP and HR readings with mostly rate controlled readings. I will have her continue Toprol 12.5 mg daily for now.  Secondary Hypercoagulable State (ICD10:  D68.69) The patient is at significant risk for stroke/thromboembolism based upon her CHA2DS2-VASc Score of 4.  Continue Apixaban (Eliquis).  No missed doses.    Follow up as scheduled with [facility] after DCCV.   Lake Bells, PA-C  Afib Clinic [facility] [address] [place], [place] [address] [phone]

## 2023-01-29 ENCOUNTER — Telehealth: Payer: Self-pay

## 2023-01-29 NOTE — Progress Notes (Signed)
Spoke to pt and instructed them to come at 1200 and to be NPO after 0000. Confirmed no missed doses of AC and instructed to take in AM with a small sip of water.   Confirmed that pt will have a ride home and someone to stay with them for 24 hours after the procedure. 

## 2023-01-30 ENCOUNTER — Encounter (HOSPITAL_COMMUNITY): Payer: Self-pay | Admitting: Anesthesiology

## 2023-01-30 ENCOUNTER — Encounter (HOSPITAL_COMMUNITY)
Admission: RE | Disposition: A | Discharge status: Home / Self Care | Payer: Self-pay | Source: Home / Self Care | Attending: Cardiology

## 2023-01-30 ENCOUNTER — Ambulatory Visit (HOSPITAL_COMMUNITY)
Admission: RE | Admit: 2023-01-30 | Discharge: 2023-01-30 | Disposition: A | Discharge status: Home / Self Care | Payer: Medicare Other | Attending: Cardiology | Admitting: Cardiology

## 2023-01-30 DIAGNOSIS — I10 Essential (primary) hypertension: Secondary | ICD-10-CM | POA: Insufficient documentation

## 2023-01-30 DIAGNOSIS — I480 Paroxysmal atrial fibrillation: Secondary | ICD-10-CM | POA: Insufficient documentation

## 2023-01-30 DIAGNOSIS — Z79899 Other long term (current) drug therapy: Secondary | ICD-10-CM | POA: Insufficient documentation

## 2023-01-30 DIAGNOSIS — E785 Hyperlipidemia, unspecified: Secondary | ICD-10-CM | POA: Insufficient documentation

## 2023-01-30 DIAGNOSIS — Z539 Procedure and treatment not carried out, unspecified reason: Secondary | ICD-10-CM | POA: Insufficient documentation

## 2023-01-30 DIAGNOSIS — D6869 Other thrombophilia: Secondary | ICD-10-CM | POA: Diagnosis not present

## 2023-01-30 DIAGNOSIS — I4892 Unspecified atrial flutter: Secondary | ICD-10-CM | POA: Insufficient documentation

## 2023-01-30 DIAGNOSIS — Z7901 Long term (current) use of anticoagulants: Secondary | ICD-10-CM | POA: Insufficient documentation

## 2023-01-30 SURGERY — INVASIVE LAB ABORTED CASE
Anesthesia: Monitor Anesthesia Care

## 2023-01-30 SURGICAL SUPPLY — 1 items: ELECT DEFIB PAD ADLT CADENCE (PAD) ×1

## 2023-01-30 NOTE — Anesthesia Preprocedure Evaluation (Deleted)
Anesthesia Evaluation    Reviewed: Allergy & Precautions, Patient's Chart, lab work & pertinent test results  Airway        Dental   Pulmonary neg pulmonary ROS          Cardiovascular hypertension, Pt. on home beta blockers + dysrhythmias Atrial Fibrillation      Neuro/Psych negative neurological ROS     GI/Hepatic Neg liver ROS,GERD  Medicated,,  Endo/Other  negative endocrine ROS    Renal/GU negative Renal ROS     Musculoskeletal  (+) Arthritis ,    Abdominal   Peds  Hematology  (+) Blood dyscrasia (Eliquis)   Anesthesia Other Findings A-FIB  Reproductive/Obstetrics                             Anesthesia Physical Anesthesia Plan Anesthesia Quick Evaluation

## 2023-01-30 NOTE — H&P (Signed)
Jennifer Pen, PA-C  Physician Assistant Certified Specialty: Cardiology   Progress Notes    Signed   Date of Service: 01/28/2023  3:00 PM  Related encounter: ATRIAL FIB OFFICE VISIT from 01/28/2023 in [facility] Atrial Fibrillation Clinic at [facility]   Signed     Expand All Collapse All       Primary Care Physician: [name], MD Primary Cardiologist: [name], MD Electrophysiologist: [name], MD      Referring Physician: Dr. Margaret Lane is a 77 y.o. female with a history of HLD, elevated coronary calcium score, HTN, and paroxysmal atrial fibrillation who presents for consultation in the [facility]. She underwent Afib ablation on 01/21/23 by Dr. Lalla Brothers. Review of phone notes show patient called on 7/11 with chest pain and diarrhea. Spoke with Gunnar Fusi 7/15 to schedule Watchman procedure and noted she is having symptomatic Afib episodes and hypotension. Scheduled for Watchman procedure on 04/10/23. Patient is on Eliquis 5 mg BID for a CHADS2VASC score of 4.   On evaluation today, she is currently in atrial flutter with RVR. She is s/p Afib ablation on 01/21/23 by Dr. Lalla Brothers. She feels as though she has been out of rhythm since ablation. Some intermittent chest discomfort initially but none now. She did finish colchicine and continues with Protonix. No SOB or trouble swallowing. Leg sites healed without issue. No missed doses of anticoagulant.   Today, she denies symptoms of orthopnea, PND, lower extremity edema, dizziness, presyncope, syncope, snoring, daytime somnolence, bleeding, or neurologic sequela. The patient is tolerating medications without difficulties and is otherwise without complaint today.      she has a BMI of Body mass index is 23.19 kg/m.Marland Kitchen    Filed Weights    01/28/23 1446  Weight: 62.2 kg            Current Outpatient Medications  Medication Sig Dispense Refill    ALPRAZolam (XANAX) 0.25 MG tablet Take 0.25 mg by mouth 2 (two) times daily as needed for anxiety.        apixaban (ELIQUIS) 5 MG TABS tablet Take 1 tablet (5 mg total) by mouth 2 (two) times daily. 180 tablet 3   Ascorbic Acid (VITAMIN C) 1000 MG tablet Take 1,000 mg by mouth daily.       atorvastatin (LIPITOR) 20 MG tablet Take 1 tablet by mouth once daily 90 tablet 3   Calcium Carb-Cholecalciferol (CALCIUM 600+D3 PO) Take 1 tablet by mouth 2 (two) times daily.       Cholecalciferol (VITAMIN D3) 50 MCG (2000 UT) capsule Take 2,000 Units by mouth every evening.       estradiol (ESTRACE) 0.5 MG tablet Take 0.5 mg by mouth at bedtime.       metoprolol succinate (TOPROL-XL) 25 MG 24 hr tablet Take 1/2 (one-half) tablet by mouth once daily 90 tablet 3   Multiple Vitamin (MULTIVITAMIN WITH MINERALS) TABS tablet Take 1 tablet by mouth daily. Senior Multivitamin       Omega-3 Fatty Acids (FISH OIL) 1000 MG CAPS Take 1,000 mg by mouth 2 (two) times daily.       pantoprazole (PROTONIX) 40 MG tablet Take 40 mg by mouth daily.       traZODone (DESYREL) 50 MG tablet Take 50 mg by mouth at bedtime as needed for sleep.       zinc gluconate 50 MG tablet Take 50 mg by mouth daily.  Current Facility-Administered Medications  Medication Dose Route Frequency Provider Last Rate Last Admin   0.9 %  sodium chloride infusion  500 mL Intravenous Once [name], MD            Atrial Fibrillation Management history:   Previous antiarrhythmic drugs: None Previous cardioversions: None Previous ablations: 01/21/23 Anticoagulation history: Eliquis     ROS- All systems are reviewed and negative except as per the HPI above.   Physical Exam: BP (!) 126/92   Pulse (!) 119   Ht 5' 4.5" (1.638 m)   Wt 62.2 kg   LMP 07/16/1979   BMI 23.19 kg/m    GEN: Well nourished, well developed in no acute distress NECK: No JVD; No carotid bruits CARDIAC: Tachycardic rate and rhythm, no murmurs,  rubs, gallops RESPIRATORY:  Clear to auscultation without rales, wheezing or rhonchi  ABDOMEN: Soft, non-tender, non-distended EXTREMITIES:  No edema; No deformity    EKG today demonstrates  Vent. rate 119 BPM PR interval * ms QRS duration 78 ms QT/QTcB 408/573 ms P-R-T axes * 49 69  Critical Test Result: Long QTc Accelerated Junctional rhythm Low voltage QRS Prolonged QT Abnormal ECG When compared with ECG of 21-Jan-2023 13:31, PREVIOUS ECG IS PRESENT   Echo 03/25/22 demonstrated   1. Left ventricular ejection fraction, by estimation, is 65 to 70%. Left  ventricular ejection fraction by 3D volume is 65 %. The left ventricle has  normal function. The left ventricle has no regional wall motion  abnormalities. Left ventricular diastolic   parameters are consistent with Grade I diastolic dysfunction (impaired  relaxation).   2. Right ventricular systolic function is normal. The right ventricular  size is normal. There is normal pulmonary artery systolic pressure.   3. The mitral valve is normal in structure. Trivial mitral valve  regurgitation.   4. The aortic valve is tricuspid. There is mild calcification of the  aortic valve. There is mild thickening of the aortic valve. Aortic valve  regurgitation is trivial. Aortic valve sclerosis/calcification is present,  without any evidence of aortic  stenosis.   5. The inferior vena cava is normal in size with greater than 50%  respiratory variability, suggesting right atrial pressure of 3 mmHg.      ASSESSMENT & PLAN CHA2DS2-VASc Score = 4  The patient's score is based upon: CHF History: 0 HTN History: 1 Diabetes History: 0 Stroke History: 0 Vascular Disease History: 0 Age Score: 2 Gender Score: 1         ASSESSMENT AND PLAN: Paroxysmal Atrial Fibrillation (ICD10:  I48.0) / atrial flutter The patient's CHA2DS2-VASc score is 4, indicating a 4.8% annual risk of stroke.   S/p Afib ablation on 01/21/23 by Dr. Lalla Brothers.   She  is in atrial flutter with RVR. She feels as though she has been out of rhythm since getting home from ablation. She has not missed any doses of Eliquis. After discussion, she would like to proceed with scheduling DCCV.      Informed Consent Shared Decision Making/Informed Consent The risks (stroke, cardiac arrhythmias rarely resulting in the need for a temporary or permanent pacemaker, skin irritation or burns and complications associated with conscious sedation including aspiration, arrhythmia, respiratory failure and death), benefits (restoration of normal sinus rhythm) and alternatives of a direct current cardioversion were explained in detail to Ms. Wilds and she agrees to proceed.        She has shown me home BP and HR readings with mostly rate controlled readings. I  will have her continue Toprol 12.5 mg daily for now.   Secondary Hypercoagulable State (ICD10:  D68.69) The patient is at significant risk for stroke/thromboembolism based upon her CHA2DS2-VASc Score of 4.  Continue Apixaban (Eliquis).  No missed doses.      Follow up as scheduled with [facility] after DCCV.     Lake Bells, PA-C  Afib Clinic [facility] [address] [place], [place] [address] [phone]          Pt presented of DCCV; however in sinus at time of evaluation; procedure canceled; continue present meds Olga Millers

## 2023-02-05 ENCOUNTER — Ambulatory Visit (HOSPITAL_COMMUNITY): Payer: Medicare Other | Admitting: Physician Assistant

## 2023-02-18 ENCOUNTER — Ambulatory Visit (HOSPITAL_COMMUNITY)
Admission: RE | Admit: 2023-02-18 | Discharge: 2023-02-18 | Disposition: A | Discharge status: Home / Self Care | Payer: Medicare Other | Source: Ambulatory Visit | Attending: Physician Assistant | Admitting: Physician Assistant

## 2023-02-18 ENCOUNTER — Encounter (HOSPITAL_COMMUNITY): Payer: Self-pay | Admitting: Physician Assistant

## 2023-02-18 VITALS — BP 122/76 | HR 83 | Ht 64.5 in | Wt 138.2 lb

## 2023-02-18 DIAGNOSIS — Z7901 Long term (current) use of anticoagulants: Secondary | ICD-10-CM | POA: Diagnosis not present

## 2023-02-18 DIAGNOSIS — I2510 Atherosclerotic heart disease of native coronary artery without angina pectoris: Secondary | ICD-10-CM | POA: Diagnosis not present

## 2023-02-18 DIAGNOSIS — I4892 Unspecified atrial flutter: Secondary | ICD-10-CM | POA: Diagnosis not present

## 2023-02-18 DIAGNOSIS — D6869 Other thrombophilia: Secondary | ICD-10-CM | POA: Diagnosis not present

## 2023-02-18 DIAGNOSIS — E785 Hyperlipidemia, unspecified: Secondary | ICD-10-CM | POA: Insufficient documentation

## 2023-02-18 DIAGNOSIS — I480 Paroxysmal atrial fibrillation: Secondary | ICD-10-CM | POA: Diagnosis present

## 2023-02-18 DIAGNOSIS — Z79899 Other long term (current) drug therapy: Secondary | ICD-10-CM | POA: Diagnosis not present

## 2023-02-18 NOTE — Progress Notes (Signed)
Primary Care Physician: [name], MD Primary Cardiologist: [name], MD Electrophysiologist: [name], MD  Referring Physician: Dr. [name] is a 77 y.o. female with a history of HLD, elevated coronary calcium score, HTN, and paroxysmal atrial fibrillation who presents for follow up in the [facility]. She underwent Afib ablation on 01/21/23 by Dr. Lalla Brothers. Review of phone notes show patient called on 7/11 with chest pain and diarrhea. Spoke with Gunnar Fusi 7/15 to schedule Watchman procedure and noted she is having symptomatic Afib episodes and hypotension. Scheduled for Watchman procedure on 04/10/23. Patient is on Eliquis 5 mg BID for a CHADS2VASC score of 4.  She is s/p Afib ablation on 01/21/23 by Dr. Lalla Brothers. She was seen by Landry Mellow 01/28/23 for recurrent atrial flutter and was set up for DCCV. She presented for DCCV on 7/18 but was in SR and the procedure was cancelled. Patient is in SR today. She does still have tachypalpitations at times but it resolves within 10-15 minutes. She denies significant chest pain, swallowing pain, or groin issues.   Today, she denies symptoms of palpitations, chest pain, SOB, orthopnea, PND, lower extremity edema, dizziness, presyncope, syncope, snoring, daytime somnolence, bleeding, or neurologic sequela. The patient is tolerating medications without difficulties and is otherwise without complaint today.     Atrial Fibrillation Management history:  Previous antiarrhythmic drugs: None Previous cardioversions: None Previous ablations: 01/21/23 Anticoagulation history: Eliquis   ROS- All systems are reviewed and negative except as per the HPI above.  Physical Exam: BP 122/76   Pulse 83   Ht 5' 4.5" (1.638 m)   Wt 62.7 kg   LMP 07/16/1979   BMI 23.36 kg/m   GEN: Well nourished, well developed in no acute distress NECK: No JVD; No carotid bruits CARDIAC: Regular rate  and rhythm, no murmurs, rubs, gallops RESPIRATORY:  Clear to auscultation without rales, wheezing or rhonchi  ABDOMEN: Soft, non-tender, non-distended EXTREMITIES:  No edema; No deformity    EKG today demonstrates  SR, 1st degree AV block Vent. rate 83 BPM PR interval 202 ms QRS duration 76 ms QT/QTcB 348/408 ms  Echo 03/25/22 demonstrated   1. Left ventricular ejection fraction, by estimation, is 65 to 70%. Left  ventricular ejection fraction by 3D volume is 65 %. The left ventricle has  normal function. The left ventricle has no regional wall motion  abnormalities. Left ventricular diastolic parameters are consistent with Grade I diastolic dysfunction (impaired relaxation).   2. Right ventricular systolic function is normal. The right ventricular  size is normal. There is normal pulmonary artery systolic pressure.   3. The mitral valve is normal in structure. Trivial mitral valve  regurgitation.   4. The aortic valve is tricuspid. There is mild calcification of the  aortic valve. There is mild thickening of the aortic valve. Aortic valve  regurgitation is trivial. Aortic valve sclerosis/calcification is present,  without any evidence of aortic  stenosis.   5. The inferior vena cava is normal in size with greater than 50%  respiratory variability, suggesting right atrial pressure of 3 mmHg.    CHA2DS2-VASc Score = 5  The patient's score is based upon: CHF History: 0 HTN History: 1 Diabetes History: 0 Stroke History: 0 Vascular Disease History: 1 (CAD on CT) Age Score: 2 Gender Score: 1       ASSESSMENT AND PLAN: Paroxysmal Atrial Fibrillation (ICD10:  I48.0) / atrial flutter The patient's CHA2DS2-VASc  score is 5, indicating a 7.2% annual risk of stroke.   S/p Afib ablation on 01/21/23 by Dr. Lalla Brothers. Patient in SR today. Reassured patient that it is not unusual to have breakthrough episodes of afib soon after an ablation.  Continue Toprol 12.5 mg daily Continue Eliquis  5 mg BID for now Scheduled for Watchman implant 04/10/23, instructions and presurgical scrub given today.   Secondary Hypercoagulable State (ICD10:  D68.69) The patient is at significant risk for stroke/thromboembolism based upon her CHA2DS2-VASc Score of 5.  Continue Apixaban (Eliquis).   CAD CAC score 391 on CT No anginal symptoms   Follow up with Dr Bjorn Pippin and for [facility] implant as scheduled.    Jorja Loa PA-C Afib [facility] [address] [place], [place] [address] [phone]

## 2023-02-19 DIAGNOSIS — D6869 Other thrombophilia: Secondary | ICD-10-CM | POA: Diagnosis not present

## 2023-02-19 DIAGNOSIS — M8580 Other specified disorders of bone density and structure, unspecified site: Secondary | ICD-10-CM | POA: Diagnosis not present

## 2023-02-26 ENCOUNTER — Ambulatory Visit: Payer: Medicare Other | Admitting: Physician Assistant

## 2023-02-26 ENCOUNTER — Ambulatory Visit: Payer: Medicare Other | Admitting: Internal Medicine

## 2023-02-26 ENCOUNTER — Telehealth: Payer: Self-pay | Admitting: Cardiology

## 2023-02-26 NOTE — Progress Notes (Deleted)
Cardiology Office Note:    Date:  02/26/2023   ID:  [name], DOB April 01, 1946, MRN [id number]  PCP:  [name], MD   [facility] Providers Cardiologist:  [name], MD Electrophysiologist:  [name], MD { Click to update primary MD,subspecialty MD or APP then REFRESH:1}    Referring MD: [name], MD   No chief complaint on file. ***  History of Present Illness:    [name] is a 77 y.o. female with a hx of hyperlipidemia, elevated coronary calcium score, hypertension, and PAF.  She is s/p A-fib ablation 01/21/2023 with Dr. Lalla Brothers.  She was tentatively scheduled for a Watchman procedure but had recurrent symptoms found to be in atrial flutter with RVR.  She mentioned that she felt as though she had been out of rhythm since her ablation.  She presented for DCCV 01/30/23 but was already in SR and procedure was canceled. She saw Jorja Loa [facility] 02/18/23           Past Medical History:  Diagnosis Date   Allergy    Arthritis    oa   Cataract of both eyes    Cervical dysplasia    Diarrhea    off and on last 3 months 1 episode today   Dry eyes    Fluid retention    slight leg swelling at times   GERD (gastroesophageal reflux disease)    Hypertension    Legally blind in left eye, as defined in [place]    since birth    Low back pain    Lumbar herniated disc    Numbness and tingling    feet bilat    Osteopenia 06/2015   T score -1.7   Syncope    none recent   Urinary frequency    Urinary hesitancy    Wears glasses     Past Surgical History:  Procedure Laterality Date   ATRIAL FIBRILLATION ABLATION N/A 01/21/2023   Procedure: ATRIAL FIBRILLATION ABLATION;  Surgeon: [name], MD;  Location: [facility] INVASIVE CV LAB;  Service: Cardiovascular;  Laterality: N/A;   COLONOSCOPY     LUMBAR LAMINECTOMY     2009 secondary to ruptured disc;    TOTAL HIP ARTHROPLASTY Left 03/25/2018   Procedure: LEFT TOTAL HIP  ARTHROPLASTY ANTERIOR APPROACH;  Surgeon: Ollen Gross, MD;  Location: [facility] ORS;  Service: Orthopedics;  Laterality: Left;   TOTAL HIP ARTHROPLASTY Right 05/07/2021   Procedure: TOTAL HIP ARTHROPLASTY ANTERIOR APPROACH;  Surgeon: Ollen Gross, MD;  Location: [facility] ORS;  Service: Orthopedics;  Laterality: Right;   TOTAL KNEE ARTHROPLASTY Left 08/02/2015   Procedure: LEFT TOTAL KNEE ARTHROPLASTY;  Surgeon: Ollen Gross, MD;  Location: [facility] OR[facility];  Service: Orthopedics;  Laterality: Left;   UPPER GASTROINTESTINAL ENDOSCOPY     VAGINAL HYSTERECTOMY  1980 or 1981   partial    Current Medications: No outpatient medications have been marked as taking for the 02/26/23 encounter (Appointment) with Marcelino Duster, PA.   Current Facility-Administered Medications for the 02/26/23 encounter (Appointment) with Marcelino Duster, PA  Medication   0.9 %  sodium chloride infusion     Allergies:   Patient has no known allergies.   Social History   Socioeconomic History   Marital status: Married    Spouse name: Not on file   Number of children: Not on file   Years of education: Not on file   Highest education level: Not on file  Occupational  History   Not on file  Tobacco Use   Smoking status: Never   Smokeless tobacco: Never  Vaping Use   Vaping status: Never Used  Substance and Sexual Activity   Alcohol use: No    Alcohol/week: 0.0 standard drinks of alcohol   Drug use: No   Sexual activity: Yes    Birth control/protection: Surgical    Comment: Hyst-1st intercourse 69 yo-1 partner  Other Topics Concern   Not on file  Social History Narrative   Not on file   Social Determinants of Health   Financial Resource Strain: Not on file  Food Insecurity: Not on file  Transportation Needs: Not on file  Physical Activity: Not on file  Stress: Not on file  Social Connections: Not on file     Family History: The patient's ***family history includes Cancer in her father; Colon cancer in her  father; Diabetes in her brother and father; Heart disease in her father; Hypertension in her brother. There is no history of Esophageal cancer, Rectal cancer, or Stomach cancer.  ROS:   Please see the history of present illness.    *** All other systems reviewed and are negative.  EKGs/Labs/Other Studies Reviewed:    The following studies were reviewed today: ***      Recent Labs: 01/06/2023: BUN 13; Creatinine, Ser 0.74; Hemoglobin 13.8; Platelets 222; Potassium 4.2; Sodium 143  Recent Lipid Panel    Component Value Date/Time   CHOL 124 08/29/2021 1039   TRIG 76 08/29/2021 1039   HDL 63 08/29/2021 1039   CHOLHDL 2.0 08/29/2021 1039   CHOLHDL 3 09/15/2009 0954   VLDL 14.4 09/15/2009 0954   LDLCALC 46 08/29/2021 1039   LDLDIRECT 136.7 09/15/2009 0954     Risk Assessment/Calculations:   {Does this patient have ATRIAL FIBRILLATION?:[phone]}  No BP recorded.  {Refresh Note OR Click here to enter BP  :1}***         Physical Exam:    VS:  LMP 07/16/1979     Wt Readings from Last 3 Encounters:  02/18/23 138 lb 3.2 oz (62.7 kg)  01/28/23 137 lb 3.2 oz (62.2 kg)  01/21/23 136 lb (61.7 kg)     GEN: *** Well nourished, well developed in no acute distress HEENT: Normal NECK: No JVD; No carotid bruits LYMPHATICS: No lymphadenopathy CARDIAC: ***RRR, no murmurs, rubs, gallops RESPIRATORY:  Clear to auscultation without rales, wheezing or rhonchi  ABDOMEN: Soft, non-tender, non-distended MUSCULOSKELETAL:  No edema; No deformity  SKIN: Warm and dry NEUROLOGIC:  Alert and oriented x 3 PSYCHIATRIC:  Normal affect   ASSESSMENT:    No diagnosis found. PLAN:    In order of problems listed above:  ***      {Are you ordering a CV Procedure (e.g. stress test, cath, DCCV, TEE, etc)?   Press F2        :[id number]}    Medication Adjustments/Labs and Tests Ordered: Current medicines are reviewed at length with the patient today.  Concerns regarding medicines are outlined  above.  No orders of the defined types were placed in this encounter.  No orders of the defined types were placed in this encounter.   There are no Patient Instructions on file for this visit.   Signed, Roe Rutherford , PA  02/26/2023 10:00 AM    [facility]

## 2023-02-26 NOTE — Telephone Encounter (Signed)
Patient is asking that the nurse gives her a call back. States that she needs to be seen asap. Please advise

## 2023-02-26 NOTE — Telephone Encounter (Signed)
Calling patient back to discuss her concerns and request to be seen ASAP. Patient states she is still having palpitations and fatigue, states she is very nervous about her upcoming procedure and wants to discuss her options with Dr. Lalla Brothers. Explained Dr. Lalla Brothers is not in the office this week. Patient repots VS stable- HR 95. BP 112/72. Patient denies SOB, CP, lightheadedness. Offered appt today with NP with patient states she is coming from [place] and would prefer appt tomorrow. Booked patient an appt w/ D. Wittenborn tomorrow 02/27/23 at 2:45 PM.

## 2023-02-27 ENCOUNTER — Encounter: Payer: Self-pay | Admitting: Student

## 2023-02-27 ENCOUNTER — Ambulatory Visit: Payer: Medicare Other | Attending: Student | Admitting: Student

## 2023-02-27 VITALS — BP 140/78 | HR 80 | Ht 64.5 in | Wt 138.0 lb

## 2023-02-27 DIAGNOSIS — I4892 Unspecified atrial flutter: Secondary | ICD-10-CM

## 2023-02-27 DIAGNOSIS — K080 Exfoliation of teeth due to systemic causes: Secondary | ICD-10-CM | POA: Diagnosis not present

## 2023-02-27 DIAGNOSIS — I480 Paroxysmal atrial fibrillation: Secondary | ICD-10-CM

## 2023-02-27 NOTE — Patient Instructions (Addendum)
Medication Instructions:  *If you need a refill on your cardiac medications before your next appointment, please call your pharmacy*   Lab Work: If you have labs (blood work) drawn today and your tests are completely normal, you will receive your results only by: MyChart Message (if you have MyChart) OR A paper copy in the mail If you have any lab test that is abnormal or we need to change your treatment, we will call you to review the results.  Follow-Up: At [facility], you and your health needs are our priority.  As part of our continuing mission to provide you with exceptional heart care, we have created designated Provider Care Teams.  These Care Teams include your primary Cardiologist (physician) and Advanced Practice Providers (APPs -  Physician Assistants and Nurse Practitioners) who all work together to provide you with the care you need, when you need it.  We recommend signing up for the patient portal called "MyChart".  Sign up information is provided on this After Visit Summary.  MyChart is used to connect with patients for Virtual Visits (Telemedicine).  Patients are able to view lab/test results, encounter notes, upcoming appointments, etc.  Non-urgent messages can be sent to your provider as well.   To learn more about what you can do with MyChart, go to [email or web address].    Your next appointment:    Keep scheduled appointment on 04/10/2023   Provider:   [name], MD

## 2023-02-27 NOTE — Progress Notes (Signed)
Cardiology Clinic Note   Date: 02/27/2023 ID: [name], DOB 16-Jan-1946, MRN [id number]  Primary Cardiologist:  [name], MD  Patient Profile    [name] is a 77 y.o. female who presents to the clinic today for concerns about heart rate/afib.    Past medical history significant for: Coronary artery calcifications. Nuclear stress test 09/21/2020: Normal, low risk study. CT cardiac scoring 01/12/2021: Calcium score 187 (71st percentile). PAF/a-flutter. 14-day ZIO 04/11/2022: 7% A-fib burden, longest episode 9 hours with average rate 70 bpm. Echo 03/25/2022: EF 65 to 70%.  Grade I DD.  Normal RV function.  Normal PA pressure.  Trivial AI.  Aortic valve sclerosis/calcification without stenosis. A-fib ablation 01/21/2023. Palpitations. 7-day ZIO: 72 episodes of SVT, longest 26 seconds 112 bpm. Hyperlipidemia. Hypertension. Pulmonary nodules. Seen on CT cardiac scoring.     History of Present Illness    [name] was first evaluated by Dr. Bjorn Pippin on 09/11/2020 for chest pain and palpitations at the request of Dr. Tiburcio Pea.  Patient reported dull aching central chest pain lasting 2 to 3 hours beginning in January 2022.  Pain was not related to exertion but unclear what brought it on.  No shortness of breath but complains of fatigue and weakness as well as lightheadedness without syncope.  Episodic heart racing with associated lightheadedness.  Labile BP ranging from 90/50 to 140/80.  She reported fatigue with propranolol.  CT cardiac scoring showed a calcium score of 187.  She has normal low risk nuclear stress test.  Echo showed normal LV/RV function with no significant valvular abnormalities.  7-day ZIO showed 72 episodes of SVT.  Changed patient from as needed propranolol to Toprol 25 mg.  Patient reported heart rates in the 40s at home with intermittent lightheadedness and Toprol dose reduced to 12.5 mg.  During follow-up visit August 2023 patient complained of  near syncopal episodes.  Echo did not show significant changes.  14-day ZIO showed 7% A-fib burden and she was started on Eliquis.  On follow-up 09/02/2022 patient complained of more frequent episodes of palpitations occurring 3-4 times a week lasting less than an hour.  She expressed interest in a Watchman device and was referred to Dr. Lalla Brothers in EP.  Patient was evaluated by Dr. Lalla Brothers on 09/17/2022.  She was scheduled for A-fib ablation and watchman implant.  At post ablation follow-up with A-fib clinic on 01/28/2023 she was found to be in a flutter with RVR.  She reported she felt as though she was out of rhythm since getting home from the ablation.  She was scheduled for DCCV however when patient presented for procedure she was in sinus rhythm and it was canceled.  Patient's last visit with A-fib clinic was on 02/18/2023.  She was in sinus rhythm.  She is pending watchman implant 04/10/2023.  Patient contacted the office on 02/26/2023 with requests to speak with Dr. Lalla Brothers about upcoming procedure.  Per triage RN: "Calling patient back to discuss her concerns and request to be seen ASAP. Patient states she is still having palpitations and fatigue, states she is very nervous about her upcoming procedure and wants to discuss her options with Dr. Lalla Brothers. Explained Dr. Lalla Brothers is not in the office this week. Patient repots VS stable- HR 95. BP 112/72. Patient denies SOB, CP, lightheadedness. Offered appt today with NP with patient states she is coming from [place] and would prefer appt tomorrow. Booked patient an appt w/ D.  tomorrow 02/27/23 at 2:45 PM."  Today, patient is accompanied by her husband. Patient is concerned about her heart rate fluctuations. She brings in a log of multiple readings of HR and BP. She is concerned that when her heart rate goes up her BP will decrease. Lowest reading 90s/50s. Highest HR 120s. Reiterated what she was told during her afib clinic with Jorja Loa, PA-C that it  is not unusual to have runs of afib post ablation. We discussed that there could be an anxiety component to her increased heart rate as well. She is maintaining NSR today with rate of 80 bpm. She wonders if she should go through with the Watchman surgery if she keeps having the heart rate fluctuations. Explained that the Watchman implant is not going to stop/fix the afib. She verbalized understanding. She and her husband are very concerned that she seems to feel worse since the ablation than she did when she initially went into afib. They have a 10 day driving trip to [place] and wonder if they should go. Questions addressed and reassurance provided.     ROS: All other systems reviewed and are otherwise negative except as noted in History of Present Illness.  Studies Reviewed    EKG Interpretation Date/Time:  Thursday February 27 2023 14:54:09 EDT Ventricular Rate:  80 PR Interval:  180 QRS Duration:  76 QT Interval:  360 QTC Calculation: 415 R Axis:   9  Text Interpretation: Normal sinus rhythm Low voltage QRS When compared with ECG of 18-Feb-2023 11:53, No significant change was found Confirmed by Carlos Levering [phone]) on 02/27/2023 2:56:51 PM    Risk Assessment/Calculations     CHA2DS2-VASc Score = 5   This indicates a 7.2% annual risk of stroke. The patient's score is based upon: CHF History: 0 HTN History: 1 Diabetes History: 0 Stroke History: 0 Vascular Disease History: 1 (CAD on CT) Age Score: 2 Gender Score: 1         Physical Exam    VS:  BP (!) 140/78   Pulse 80   Ht 5' 4.5" (1.638 m)   Wt 138 lb (62.6 kg)   LMP 07/16/1979   SpO2 98%   BMI 23.32 kg/m  , BMI Body mass index is 23.32 kg/m.  GEN: Well nourished, well developed, in no acute distress. Neck: No JVD or carotid bruits. Cardiac:  RRR. No murmurs. No rubs or gallops.   Respiratory:  Respirations regular and unlabored. Clear to auscultation without rales, wheezing or rhonchi. GI: Soft, nontender,  nondistended. Extremities: Radials/DP/PT 2+ and equal bilaterally. No clubbing or cyanosis. No edema.  Skin: Warm and dry, no rash. Neuro: Strength intact.  Assessment & Plan    PAF/A-flutter.  14-day ZIO September 2023 showed 7% A-fib burden.  She underwent A-fib ablation on 01/21/2023.  Post ablation she was found to be in a flutter with RVR.  She presented for DCCV but was in sinus rhythm so procedure was canceled.  She is now pending watchman implant scheduled for 04/10/2023.  Patient is very anxious about upcoming Watchman procedure. She is concerned that her heart rate and BP seem to be fluctuating frequently. She was taking an extra dose of Toprol as needed but has stopped doing that because her BP is sometimes in the 90s/50s. She will sometimes take her dose of Toprol earlier than bed time. Suggested she take dose of metoprolol in the late morning and if she feels she needs a second dose she can take it at bedtime. She liked that idea and will try  it.   Denies spontaneous bleeding concerns. She is maintaining NSR today, rate 80 bpm. Answered questions and provided reassurance to patient and husband.   Continue Toprol and Eliquis.  Disposition: Return for previously scheduled visit with Dr. Bjorn Pippin in September per patient request.          Signed, Etta Grandchild. , DNP, NP-C

## 2023-03-16 ENCOUNTER — Other Ambulatory Visit: Payer: Self-pay | Admitting: Cardiology

## 2023-03-18 NOTE — Progress Notes (Signed)
Cardiology Office Note:    Date:  03/21/2023   ID:  [name], DOB 1946/01/20, MRN [id number]  PCP:  [name], MD  Cardiologist:  [name], MD  Electrophysiologist:  [name], MD   Referring MD: [name], MD   Chief Complaint  Patient presents with   Atrial Fibrillation    History of Present Illness:    [name] is a 77 y.o. female with a hx of hyperlipidemia who presents for follow-up.  She was referred by Dr. Tiburcio Pea for evaluation of chest pain and palpitations, initially seen on 09/11/2020.  She reports that she started having chest pain in January 2022.  States that it occurred for 3 to 4 days and then resolved.  Describes dull aching pain in center of her chest, would last for 2 to 3 hours.  Unclear what brings it on, and has not noted any relationship with exertion.  She denies any shortness of breath but reports she feels tired and weak.  Also reports intermittent lightheadedness but denies any syncope.  Reports she is having episodes few times per week where she feels like her heart is racing.  Reports lightheadedness during episodes.  She denies any lower extremity edema.  Reports BP has been labile, as low as 90s over 50s and as high as 140s over 80s.  She has been taking propranolol but feels very fatigued on the medication.  No smoking history.  Family history includes father had valve replacement in 57s.  Lexiscan Myoview on 09/21/2020 showed normal perfusion, baseline ST depressions in inferior leads that worsened during stress, EF 74%.  Echocardiogram on 10/03/2020 showed normal biventricular function, no significant valvular disease.  Zio patch x7 days on 10/03/2020 showed 72 episodes of SVT, longest lasting 26 seconds.  Calcium score on 01/12/2021 was 187 (71st percentile).  Also with multiple small pulmonary nodules.  Zio patch x14 days on 04/03/2022 showed 7% atrial fibrillation burden, longest episode lasting 9 hours with average  rate 70 bpm.  Echocardiogram 03/25/2022 showed normal biventricular function, no significant valvular disease.  Since last clinic visit, she reports she is doing okay.  Since her ablation, she states that she is continue to feel that she has had repeated episodes of A-fib.  She feels palpitations as well as chest pain and shortness of breath when in A-fib.  She denies any bleeding on Eliquis.   Past Medical History:  Diagnosis Date   Allergy    Arthritis    oa   Cataract of both eyes    Cervical dysplasia    Diarrhea    off and on last 3 months 1 episode today   Dry eyes    Fluid retention    slight leg swelling at times   GERD (gastroesophageal reflux disease)    Hypertension    Legally blind in left eye, as defined in [place]    since birth    Low back pain    Lumbar herniated disc    Numbness and tingling    feet bilat    Osteopenia 06/2015   T score -1.7   Syncope    none recent   Urinary frequency    Urinary hesitancy    Wears glasses     Past Surgical History:  Procedure Laterality Date   ATRIAL FIBRILLATION ABLATION N/A 01/21/2023   Procedure: ATRIAL FIBRILLATION ABLATION;  Surgeon: [name], MD;  Location: [facility];  Service: Cardiovascular;  Laterality: N/A;  COLONOSCOPY     LUMBAR LAMINECTOMY     2009 secondary to ruptured disc;    TOTAL HIP ARTHROPLASTY Left 03/25/2018   Procedure: LEFT TOTAL HIP ARTHROPLASTY ANTERIOR APPROACH;  Surgeon: Ollen Gross, MD;  Location: [facility];  Service: Orthopedics;  Laterality: Left;   TOTAL HIP ARTHROPLASTY Right 05/07/2021   Procedure: TOTAL HIP ARTHROPLASTY ANTERIOR APPROACH;  Surgeon: Ollen Gross, MD;  Location: [facility];  Service: Orthopedics;  Laterality: Right;   TOTAL KNEE ARTHROPLASTY Left 08/02/2015   Procedure: LEFT TOTAL KNEE ARTHROPLASTY;  Surgeon: Ollen Gross, MD;  Location: [facility];  Service: Orthopedics;  Laterality: Left;   UPPER GASTROINTESTINAL ENDOSCOPY     VAGINAL HYSTERECTOMY  1980 or 1981    partial    Current Medications: Current Meds  Medication Sig   ALPRAZolam (XANAX) 0.25 MG tablet Take 0.25 mg by mouth 2 (two) times daily as needed for anxiety.    apixaban (ELIQUIS) 5 MG TABS tablet Take 1 tablet (5 mg total) by mouth 2 (two) times daily.   Ascorbic Acid (VITAMIN C) 1000 MG tablet Take 1,000 mg by mouth daily.   atorvastatin (LIPITOR) 20 MG tablet Take 1 tablet by mouth once daily   Bacillus Coagulans-Inulin (ALIGN PREBIOTIC-PROBIOTIC PO) Take 1 capsule by mouth daily.   Calcium Carb-Cholecalciferol (CALCIUM 600+D3 PO) Take 1 tablet by mouth 2 (two) times daily.   Cholecalciferol (VITAMIN D3) 50 MCG (2000 UT) capsule Take 2,000 Units by mouth every evening.   estradiol (ESTRACE) 0.5 MG tablet Take 0.5 mg by mouth at bedtime.   metoprolol succinate (TOPROL-XL) 25 MG 24 hr tablet Take 1/2 (one-half) tablet by mouth once daily   Multiple Vitamin (MULTIVITAMIN WITH MINERALS) TABS tablet Take 1 tablet by mouth daily. Senior Multivitamin   Omega-3 Fatty Acids (FISH OIL) 1000 MG CAPS Take 1,000 mg by mouth 2 (two) times daily.   pantoprazole (PROTONIX) 40 MG tablet Take 40 mg by mouth daily.   traZODone (DESYREL) 50 MG tablet Take 50 mg by mouth at bedtime.   zinc gluconate 50 MG tablet Take 50 mg by mouth daily.     Allergies:   Patient has no known allergies.   Social History   Socioeconomic History   Marital status: Married    Spouse name: Not on file   Number of children: Not on file   Years of education: Not on file   Highest education level: Not on file  Occupational History   Not on file  Tobacco Use   Smoking status: Never   Smokeless tobacco: Never  Vaping Use   Vaping status: Never Used  Substance and Sexual Activity   Alcohol use: No    Alcohol/week: 0.0 standard drinks of alcohol   Drug use: No   Sexual activity: Yes    Birth control/protection: Surgical    Comment: Hyst-1st intercourse 72 yo-1 partner  Other Topics Concern   Not on file   Social History Narrative   Not on file   Social Determinants of Health   Financial Resource Strain: Not on file  Food Insecurity: Not on file  Transportation Needs: Not on file  Physical Activity: Not on file  Stress: Not on file  Social Connections: Not on file     Family History: The patient's family history includes Cancer in her father; Colon cancer in her father; Diabetes in her brother and father; Heart disease in her father; Hypertension in her brother. There is no history of Esophageal cancer, Rectal cancer, or Stomach cancer.  ROS:   Please see the history of present illness.     All other systems reviewed and are negative.  EKGs/Labs/Other Studies Reviewed:    The following studies were reviewed today:  Echo 03/22:    1. Left ventricular ejection fraction, by estimation, is 60 to 65%. The  left ventricle has normal function. The left ventricle has no regional  wall motion abnormalities. Left ventricular diastolic parameters are  indeterminate.   2. Right ventricular systolic function is normal. The right ventricular  size is normal. There is normal pulmonary artery systolic pressure.   3. The mitral valve is normal in structure. No evidence of mitral valve  regurgitation. No evidence of mitral stenosis.   4. The aortic valve is normal in structure. Aortic valve regurgitation is  not visualized. No aortic stenosis is present.   Long term monitor 03/22:  Study Highlights   72 episodes of SVT, longest lasting 26 seconds with rate 112 bpm     Patch Wear Time:  7 days and 2 hours (2022-03-11T11:48:10-498 to 2022-03-18T14:57:26-0400)   Patient had a min HR of 53 bpm, max HR of 144 bpm, and avg HR of 69 bpm. Predominant underlying rhythm was Sinus Rhythm. 72 Supraventricular Tachycardia runs occurred, the run with the fastest interval lasting 18 beats with a max rate of 144 bpm, the longest lasting 26.3 secs with an avg rate of 112 bpm. Isolated SVEs were rare  (<1.0%), SVE Couplets were rare (<1.0%), and SVE Triplets were rare (<1.0%). Isolated VEs were rare (<1.0%), and no VE Couplets or VE Triplets were present. 2 patient triggered events, corresponding to sinus rhythm +/- PACs  Lexiscan 09/21/20  Study Highlights ST depressions in inferior leads at rest, worsened during stress, along with <67mm horizontal ST depressions in V5/6 The left ventricular ejection fraction is hyperdynamic (>65%). Nuclear stress EF: 74%. The study is normal. This is a low risk study.   EKG:  03/21/2023: Normal sinus rhythm, rate 72, low voltage 09/02/22: sinus bradycardia, rate 57, poor R wave progression 05/09/22: Normal sinus rhythm, rate 75, low voltage, no ST abnormalities 03/06/2022: Normal sinus rhythm, rate 75, low voltage 08/29/21:NSR, rate 61, poor R wave progression 02/01/21: sinus rhythm, rate 54, low voltage, no st abnormalities 12/13/2020- No EKG ordered  09/11/2020- The ekg ordered demonstrates normal sinus rhythm, rate 71, low voltage, no ST abnormalities  Recent Labs: 01/06/2023: BUN 13; Creatinine, Ser 0.74; Hemoglobin 13.8; Platelets 222; Potassium 4.2; Sodium 143  Recent Lipid Panel    Component Value Date/Time   CHOL 124 08/29/2021 1039   TRIG 76 08/29/2021 1039   HDL 63 08/29/2021 1039   CHOLHDL 2.0 08/29/2021 1039   CHOLHDL 3 09/15/2009 0954   VLDL 14.4 09/15/2009 0954   LDLCALC 46 08/29/2021 1039   LDLDIRECT 136.7 09/15/2009 0954    Physical Exam:    VS:  BP 116/70   Pulse 75   Ht 5' 4.5" (1.638 m)   Wt 142 lb 6.4 oz (64.6 kg)   LMP 07/16/1979   SpO2 95%   BMI 24.07 kg/m     Wt Readings from Last 3 Encounters:  03/21/23 142 lb 6.4 oz (64.6 kg)  02/27/23 138 lb (62.6 kg)  02/18/23 138 lb 3.2 oz (62.7 kg)     WUJ:WJXB nourished, well developed in no acute distress HEENT: Normal NECK: No JVD; No carotid bruits LYMPHATICS: No lymphadenopathy CARDIAC:RRR, no murmurs, rubs, gallops RESPIRATORY:  Clear to auscultation without  rales, wheezing or rhonchi  ABDOMEN: Soft, non-tender,  non-distended MUSCULOSKELETAL:  No edema; No deformity  SKIN: Warm and dry NEUROLOGIC:  Alert and oriented x 3 PSYCHIATRIC:  Normal affect   ASSESSMENT:    1. PAF (paroxysmal atrial fibrillation) (HCC)   2. SVT (supraventricular tachycardia)   3. Hyperlipidemia, unspecified hyperlipidemia type   4. Lung nodule, multiple     PLAN:    Atrial fibrillation:  Zio patch x14 days on 04/03/2022 showed 7% atrial fibrillation burden, longest episode lasting 9 hours with average rate 70 bpm.  CHA2DS2-VASc score 3 (age x2, female).  Echocardiogram 03/25/2022 showed normal biventricular function, no significant valvular disease. -Continue Eliquis 5 mg twice daily.  She does not wish to take anticoagulation long-term and is interested in [place] device.  Referred to Dr. Lalla Brothers.  Underwent ablation on 01/21/2023.  Scheduled for watchman 04/10/2023.  Check CBC, BMET -Continue Toprol-XL 12.5 mg daily  Chest pain: Atypical in description but does have CAD risk factors (age, hyperlipidemia).  Lexiscan Myoview on 09/21/2020 showed normal perfusion, baseline ST depressions in inferior leads that worsened during stress, EF 74%.  Echocardiogram on 10/03/2020 showed normal biventricular function, no significant valvular disease.   -No further cardiac work-up recommended  SVT: Zio patch x7 days on 10/03/2020 showed 72 episodes of SVT, longest lasting 26 seconds.  She has been taking as needed propranolol, stopped propranolol and started Toprol-XL 25 mg daily.  Dose reduced to 12.5 mg daily as reported heart rates in 40s at home and intermittent lightheadedness  Hyperlipidemia: LDL 98 on 12/13/20.  Calcium score on 01/12/2021 was 187 (71st percentile).  Started atorvastatin 20 mg daily.  LDL 46 on 04/15/2022  Pulmonary nodules: Multiple small pulmonary nodules bilaterally on calcium score.  Largest measures 6 mm.  Repeat chest CT was recommended at 6 months.  Repeat  chest CT on 07/13/2021 showed 7 mm nodule in right lower lobe that was not included on previous CT. Stable chest CT 01/11/2022, optional follow-up at 18 to 24 months  RTC in 6 months    Medication Adjustments/Labs and Tests Ordered: Current medicines are reviewed at length with the patient today.  Concerns regarding medicines are outlined above.  Orders Placed This Encounter  Procedures   Basic metabolic panel   CBC w/Diff/Platelet   EKG 12-Lead    No orders of the defined types were placed in this encounter.    Patient Instructions  Medication Instructions:  Continue same medications *If you need a refill on your cardiac medications before your next appointment, please call your pharmacy*   Lab Work: Cbc,bmet today   Testing/Procedures: None ordered   Follow-Up: At [facility], you and your health needs are our priority.  As part of our continuing mission to provide you with exceptional heart care, we have created designated Provider Care Teams.  These Care Teams include your primary Cardiologist (physician) and Advanced Practice Providers (APPs -  Physician Assistants and Nurse Practitioners) who all work together to provide you with the care you need, when you need it.  We recommend signing up for the patient portal called "MyChart".  Sign up information is provided on this After Visit Summary.  MyChart is used to connect with patients for Virtual Visits (Telemedicine).  Patients are able to view lab/test results, encounter notes, upcoming appointments, etc.  Non-urgent messages can be sent to your provider as well.   To learn more about what you can do with MyChart, go to [email or web address].    Your next appointment:  6 months    Call  in Nov to schedule Feb appointment     Provider:  Dr.Aziel Morgan       Signed, [name], MD  03/21/2023 9:45 AM    [facility]

## 2023-03-21 ENCOUNTER — Ambulatory Visit: Payer: Medicare Other | Attending: Cardiology | Admitting: Cardiology

## 2023-03-21 ENCOUNTER — Encounter: Payer: Self-pay | Admitting: Cardiology

## 2023-03-21 VITALS — BP 116/70 | HR 75 | Ht 64.5 in | Wt 142.4 lb

## 2023-03-21 DIAGNOSIS — I480 Paroxysmal atrial fibrillation: Secondary | ICD-10-CM | POA: Diagnosis not present

## 2023-03-21 DIAGNOSIS — E785 Hyperlipidemia, unspecified: Secondary | ICD-10-CM | POA: Diagnosis not present

## 2023-03-21 DIAGNOSIS — R918 Other nonspecific abnormal finding of lung field: Secondary | ICD-10-CM

## 2023-03-21 DIAGNOSIS — I471 Supraventricular tachycardia, unspecified: Secondary | ICD-10-CM | POA: Diagnosis not present

## 2023-03-21 MED ORDER — APIXABAN 5 MG PO TABS: 5.0000 mg | ORAL_TABLET | Freq: Two times a day (BID) | ORAL | 0 refills | Status: DC

## 2023-03-21 NOTE — Patient Instructions (Signed)
Medication Instructions:  Continue same medications *If you need a refill on your cardiac medications before your next appointment, please call your pharmacy*   Lab Work: Cbc,bmet today   Testing/Procedures: None ordered   Follow-Up: At [facility], you and your health needs are our priority.  As part of our continuing mission to provide you with exceptional heart care, we have created designated Provider Care Teams.  These Care Teams include your primary Cardiologist (physician) and Advanced Practice Providers (APPs -  Physician Assistants and Nurse Practitioners) who all work together to provide you with the care you need, when you need it.  We recommend signing up for the patient portal called "MyChart".  Sign up information is provided on this After Visit Summary.  MyChart is used to connect with patients for Virtual Visits (Telemedicine).  Patients are able to view lab/test results, encounter notes, upcoming appointments, etc.  Non-urgent messages can be sent to your provider as well.   To learn more about what you can do with MyChart, go to [email or web address].    Your next appointment:  6 months    Call in Nov to schedule Feb appointment     Provider:  Dr.Schumann

## 2023-03-22 LAB — CBC WITH DIFFERENTIAL/PLATELET
Basophils Absolute: 0 10*3/uL (ref 0.0–0.2)
Basos: 1 %
EOS (ABSOLUTE): 0.1 10*3/uL (ref 0.0–0.4)
Eos: 2 %
Hematocrit: 41.3 % (ref 34.0–46.6)
Hemoglobin: 14 g/dL (ref 11.1–15.9)
Immature Grans (Abs): 0 10*3/uL (ref 0.0–0.1)
Immature Granulocytes: 1 %
Lymphocytes Absolute: 1.7 10*3/uL (ref 0.7–3.1)
Lymphs: 27 %
MCH: 32.6 pg (ref 26.6–33.0)
MCHC: 33.9 g/dL (ref 31.5–35.7)
MCV: 96 fL (ref 79–97)
Monocytes Absolute: 0.7 10*3/uL (ref 0.1–0.9)
Monocytes: 11 %
Neutrophils Absolute: 3.7 10*3/uL (ref 1.4–7.0)
Neutrophils: 58 %
Platelets: 218 10*3/uL (ref 150–450)
RBC: 4.3 x10E6/uL (ref 3.77–5.28)
RDW: 12.4 % (ref 11.7–15.4)
WBC: 6.3 10*3/uL (ref 3.4–10.8)

## 2023-03-22 LAB — BASIC METABOLIC PANEL WITH GFR
BUN/Creatinine Ratio: 13 (ref 12–28)
BUN: 10 mg/dL (ref 8–27)
CO2: 27 mmol/L (ref 20–29)
Calcium: 9.6 mg/dL (ref 8.7–10.3)
Chloride: 104 mmol/L (ref 96–106)
Creatinine, Ser: 0.75 mg/dL (ref 0.57–1.00)
Glucose: 106 mg/dL — ABNORMAL HIGH (ref 70–99)
Potassium: 4.6 mmol/L (ref 3.5–5.2)
Sodium: 140 mmol/L (ref 134–144)
eGFR: 82 mL/min/{1.73_m2}

## 2023-03-24 ENCOUNTER — Telehealth: Payer: Self-pay

## 2023-03-24 NOTE — Telephone Encounter (Addendum)
The patient saw Dr. Bjorn Pippin in the office last week and told him she'd like a call from the [facility] coordinator to discuss the procedure.  Called to check-in today.  The patient is extremely worried about having Watchman implant 04/10/2023. She reported her afib is "all over the place" and her HR jumps from 60s to 110 to 80s within minutes.  She can hardly read a book without her heart racing and having symptoms of SOB.  She has been seen in the Afib Clinic twice and general Cardiology twice since her ablation 01/21/2023. She is scheduled to see Dr. Lalla Brothers in November but would like to see him sooner. She understands she will be called if a sooner appointment becomes available. While on the phone, she also requested to postpone Watchman until the new year.  Rescheduled her procedure to January 2025. Will follow and arrange appropriate pre-procedure visit. She was grateful for call and agreed with plan.

## 2023-03-27 ENCOUNTER — Telehealth: Payer: Self-pay | Admitting: Cardiology

## 2023-03-27 NOTE — Telephone Encounter (Signed)
Spoke with the patient's husband who reports that the patient continues to have symptoms and "feels terrible". When asked to describe her symptoms he cannot give me anything specific on how she feels other than not wanting to do anything. He states that she will be doing nothing and her heart rate and blood pressure will increase. Heart rate will get up to 110 and blood pressure up to 160/80. She feels like she has been going in and out of afib. Patient's husband states that they are very frustrated that no one has done anything to help her. Advised that it is common for patient's to have breakthrough afib for the first three months after an ablation. She is taking her medications as prescribed. She has been seen by the Afib clinic twice since her ablation. They set her up for a cardioversion but upon arrival she was in normal rhythm so procedure was cancelled. He states that he will not have the patient go back to the afib clinic. She has also been seen by general cardiology twice since her ablation. He states that everyone keeps telling them that she needs to wait it out. He is mad that Dr. Lalla Brothers has not done anything and can't see the patient for 3 months after the procedure. I advised the husband on our timeline for follow up. I offered an appointment with Dr. Lalla Brothers in [place] on 9/25 as he is not in the [place] office the rest of the month. The husband refused the offered appointment and states that he would like for the patient to see another provider.

## 2023-03-27 NOTE — Telephone Encounter (Signed)
New Message:      Patient's husband called. He would like to Dr Bjorn Pippin or his nurse, if he is not in. He said patient had an Ablation about over 2 months ago. He says it not working .iHe says the patient is not doing well.

## 2023-03-28 ENCOUNTER — Telehealth: Payer: Self-pay

## 2023-03-28 NOTE — Telephone Encounter (Signed)
Connected with patients husband in follow up call.

## 2023-03-28 NOTE — Telephone Encounter (Signed)
Attempted phone call to pt to offer appointment with Dr Lovena Neighbours PA, Otilio Saber on 04/01/2023 as Dr Lalla Brothers is not in the [place] office until 04/16/2023.Marland Kitchen  Left voicemail message to contact office at [phone]

## 2023-03-28 NOTE — Telephone Encounter (Deleted)
Spoke with patients husband to address concerns.

## 2023-03-31 NOTE — Telephone Encounter (Signed)
Spoke with pt who states she is aware of appointment with Otilio Saber, PA-C and confirms she will be here for that appointment.  Pt thanked Charity fundraiser for the call.

## 2023-04-01 ENCOUNTER — Ambulatory Visit: Payer: Medicare Other | Admitting: Student

## 2023-04-02 ENCOUNTER — Telehealth: Payer: Self-pay | Admitting: *Deleted

## 2023-04-02 NOTE — Telephone Encounter (Signed)
Made follow up contact with Pt [name]. [name] to offer follow up from call with her husband [name] from last week.  [name] was thankful for the call and reports things have turned around and she feels much better since Monday morning (03/31/23) and has felt good since that time.  She denies the need for a visit to see Dr. Lalla Brothers at this time.  A visit to see him on 04/09/23 was offered and she reports that this is not needed and that her follow up in November is all she believes she needs right now.  She reports to feeling supported and that my call with them last week helped her and her husband understand where we are and that they are appreciative of the entire team and "love Dr. Lalla Brothers".  She goes on to report she knows she can call if anything changes.  I shared with her I would make Dr. Lalla Brothers aware and that I would call back if there were any recommendations prior to her visit  in November.   Jacqulyn Cane RN, BSN, CCRN-K

## 2023-04-08 ENCOUNTER — Other Ambulatory Visit: Payer: Self-pay | Admitting: Cardiology

## 2023-04-08 ENCOUNTER — Telehealth: Payer: Self-pay | Admitting: Cardiology

## 2023-04-08 DIAGNOSIS — R42 Dizziness and giddiness: Secondary | ICD-10-CM

## 2023-04-08 DIAGNOSIS — I480 Paroxysmal atrial fibrillation: Secondary | ICD-10-CM

## 2023-04-08 NOTE — Telephone Encounter (Signed)
Spoke with patient and she stated she would like to speak with Thayer Ohm. She states she has been talking to [place] and needs to speak with him. She states she feels like her BP and HR has been all over the place since her ablation. She states her highest BP was 164/80 and her highest HR  was 117. She has canceled watchman for tomorrow. Going in and out of AFIB has her feeling horrible but can't really explain what feeling horrible means.  She states she is really nervous and anxious but doesn't know why. Does have feeling of tightness in her chest at times. She would like to wear a monitor again before her appointment in November. Advised that she is still in her 3 month window and can have breakthrough AFIB. Assured her to continuing taking medications as prescribed. I did advise to contact PCP about her anxiety as well,.  I spoke with Diona Fanti  RN and he states per his conversation with Dr, Lalla Brothers he is in agreement with patient wearing a monitor.   Order placed for monitor. Patient is aware we will give her a call to place monitor.

## 2023-04-08 NOTE — Telephone Encounter (Signed)
Patient called requesting to speak with Dr. Lalla Brothers or nurse....please call back

## 2023-04-09 ENCOUNTER — Ambulatory Visit: Payer: Medicare Other | Attending: Cardiology

## 2023-04-09 ENCOUNTER — Other Ambulatory Visit: Payer: Self-pay | Admitting: Cardiology

## 2023-04-09 DIAGNOSIS — I480 Paroxysmal atrial fibrillation: Secondary | ICD-10-CM

## 2023-04-09 DIAGNOSIS — R42 Dizziness and giddiness: Secondary | ICD-10-CM | POA: Diagnosis not present

## 2023-04-09 NOTE — Telephone Encounter (Signed)
07/24/2023 LAAO procedure cancelled. Will hold the patient's spot in case the patient's heart rate/rhythm is better controlled by that time.

## 2023-04-09 NOTE — Progress Notes (Unsigned)
ZIO XT serial # [id number] from office inventory applied to patient using tincture of benzoin.

## 2023-04-09 NOTE — Telephone Encounter (Signed)
Prescription refill request for Eliquis received. Indication: Afib  Last office visit:03/21/23 Bjorn Pippin)  Scr: 0.75 (03/21/23)  Age: 77 Weight: 64.6kg  Appropriate dose. Refill sent.

## 2023-04-18 DIAGNOSIS — E7800 Pure hypercholesterolemia, unspecified: Secondary | ICD-10-CM | POA: Diagnosis not present

## 2023-04-22 DIAGNOSIS — F5101 Primary insomnia: Secondary | ICD-10-CM | POA: Diagnosis not present

## 2023-04-22 DIAGNOSIS — I480 Paroxysmal atrial fibrillation: Secondary | ICD-10-CM | POA: Diagnosis not present

## 2023-04-22 DIAGNOSIS — Z0000 Encounter for general adult medical examination without abnormal findings: Secondary | ICD-10-CM | POA: Diagnosis not present

## 2023-04-22 DIAGNOSIS — D6869 Other thrombophilia: Secondary | ICD-10-CM | POA: Diagnosis not present

## 2023-04-22 DIAGNOSIS — E7800 Pure hypercholesterolemia, unspecified: Secondary | ICD-10-CM | POA: Diagnosis not present

## 2023-04-22 DIAGNOSIS — Z23 Encounter for immunization: Secondary | ICD-10-CM | POA: Diagnosis not present

## 2023-05-01 DIAGNOSIS — R42 Dizziness and giddiness: Secondary | ICD-10-CM | POA: Diagnosis not present

## 2023-05-01 DIAGNOSIS — I480 Paroxysmal atrial fibrillation: Secondary | ICD-10-CM | POA: Diagnosis not present

## 2023-05-06 DIAGNOSIS — X32XXXA Exposure to sunlight, initial encounter: Secondary | ICD-10-CM | POA: Diagnosis not present

## 2023-05-06 DIAGNOSIS — L570 Actinic keratosis: Secondary | ICD-10-CM | POA: Diagnosis not present

## 2023-05-06 DIAGNOSIS — L821 Other seborrheic keratosis: Secondary | ICD-10-CM | POA: Diagnosis not present

## 2023-06-03 NOTE — Progress Notes (Addendum)
Electrophysiology Office Follow up Visit Note:    Date:  06/06/2023   ID:  SACHET TAFEL, DOB 02-16-1946, MRN [id number]  PCP:  [name], MD  [facility] Cardiologist:  [name], MD  [facility] Electrophysiologist:  [name], MD    Interval History:     [name] is a 77 y.o. female who presents for a follow up visit.   Discussed the use of AI scribe software for clinical note transcription with the patient, who gave verbal consent to proceed.  I last saw the patient September 17, 2022 for her atrial fibrillation.  She underwent an A-fib ablation on January 21, 2023. After her ablation she initially had episodes of atrial fibrillation.   History of Present Illness   The patient, with a history of atrial fibrillation and ablation, presents with concerns about a persistently fast heart rate and low blood pressure. They report feeling nervous and shaky when their heart rate is high, and they have noticed that their heart rate does not slow down much. The patient's heart rate was measured at 91 beats per minute during the visit, which they perceive as high compared to their usual heart rate of 60-70 beats per minute. They express concern that this elevated heart rate could wear out their heart.  The patient also reports that their blood pressure drops when their heart rate is high, which they believe contributes to their feeling unwell. They have been monitoring their blood pressure and heart rate at home and have noticed that their blood pressure can drop to as low as 70/50. They have been taking metoprolol to manage their heart rate, but they note that this medication causes their blood pressure to drop further.  The patient is also on Eliquis, which they report taking regularly with only one missed dose. They express concern about the cost of this medication. They also take trazodone and Xanax at night, and occasionally take an additional half dose of  Xanax during the day when they feel particularly nervous.            Past medical, surgical, social and family history were reviewed.  ROS:   Please see the history of present illness.    All other systems reviewed and are negative.  EKGs/Labs/Other Studies Reviewed:    The following studies were reviewed today:  April 09, 2023 ZIO monitor personally reviewed HR 49 - 138, average 78. 1058 SVT, longest 12 min 42 seconds with an average rate 106 bpm. <1% AF burden, longest episode 2 hours 46 seconds with an average rate of 68 bpm. 1 pause lasting 3 seconds Rare supraventricular and ventricular ectopy.      EKG Interpretation Date/Time:  Wednesday June 04 2023 14:12:06 EST Ventricular Rate:  91 PR Interval:  196 QRS Duration:  76 QT Interval:  336 QTC Calculation: 413 R Axis:   19  Text Interpretation: Normal sinus rhythm Normal ECG Confirmed by Steffanie Dunn [phone]) on 06/04/2023 2:33:14 PM    Physical Exam:    VS:  BP 110/66   Pulse 91   Ht 5\' 4"  (1.626 m)   Wt 140 lb 9.6 oz (63.8 kg)   LMP 07/16/1979   SpO2 98%   BMI 24.13 kg/m     Wt Readings from Last 3 Encounters:  06/04/23 140 lb 9.6 oz (63.8 kg)  03/21/23 142 lb 6.4 oz (64.6 kg)  02/27/23 138 lb (62.6 kg)     Physical Exam   GENERAL:  Elderly woman in no distress, somewhat anxious appearing CHEST: No increased work of breathing CARDIOVASCULAR: Regular rate and rhythm          ASSESSMENT:    1. PAF (paroxysmal atrial fibrillation) (HCC)   2. Atrial flutter, unspecified type (HCC)    PLAN:    In order of problems listed above:  Assessment and Plan    Atrial Fibrillation Patient reports feeling nervous and shaky with elevated heart rate. EKG shows normal rhythm with an average heart rate of 78 bpm. Patient has a history of AFib, but recent monitoring shows only one episode of AFib and frequent PACs. Patient is currently on Eliquis with no reported bleeding concerns. -Continue  Eliquis as patient is tolerating it well and it may provide better stroke protection than Watchman device. -Consider compression hose to help manage periods of low blood pressure. - Cancel Watchman  Hypotension Patient reports feeling unwell when blood pressure is low. Patient has noticed correlation between feeling unwell and low blood pressure readings. -Advise patient to liberalize salt intake to help manage low blood pressure. -Recommend use of compression hose to prevent low blood pressure swings.  Follow-up in 6 months with APP.      Continue Eliquis and metoprolol         Signed, Steffanie Dunn, MD, [facility], [facility] 06/06/2023 8:56 AM    Electrophysiology [facility]

## 2023-06-04 ENCOUNTER — Encounter: Payer: Self-pay | Admitting: Cardiology

## 2023-06-04 ENCOUNTER — Ambulatory Visit: Payer: Medicare Other | Attending: Cardiology | Admitting: Cardiology

## 2023-06-04 VITALS — BP 110/66 | HR 91 | Ht 64.0 in | Wt 140.6 lb

## 2023-06-04 DIAGNOSIS — I480 Paroxysmal atrial fibrillation: Secondary | ICD-10-CM

## 2023-06-04 DIAGNOSIS — I4892 Unspecified atrial flutter: Secondary | ICD-10-CM | POA: Diagnosis not present

## 2023-06-04 NOTE — Patient Instructions (Signed)
Medication Instructions:  Your physician recommends that you continue on your current medications as directed. Please refer to the Current Medication list given to you today.  *If you need a refill on your cardiac medications before your next appointment, please call your pharmacy*  Follow-Up: At [facility], you and your health needs are our priority.  As part of our continuing mission to provide you with exceptional heart care, we have created designated Provider Care Teams.  These Care Teams include your primary Cardiologist (physician) and Advanced Practice Providers (APPs -  Physician Assistants and Nurse Practitioners) who all work together to provide you with the care you need, when you need it.   Your next appointment:   6 months  Provider:   You will see one of the following Advanced Practice Providers on your designated Care Team:   Francis Dowse, Charlott Holler [address]" [place], [place] Sherie Don, NP Canary Brim, NP  Dr. Lalla Brothers recommends that you use compression hose

## 2023-06-25 IMAGING — CT CT CHEST W/O CM
2 of 4 series · 15 of 36 positions shown, 18 images · non-contrast
Comparison: CT coronary artery calcium score done on 01/12/2021

CLINICAL DATA: Lung nodule seen in the previous CT done for
coronary calcium score

EXAM:
CT CHEST WITHOUT CONTRAST
TECHNIQUE: Multidetector CT imaging of the chest was performed following the
standard protocol without IV contrast.

[Series 2: thorax · axial · 0.69mm/px · z∈[-243,+27]mm · 12 of 161 slices shown, 15 images]
[im 13/161  mediastinal]
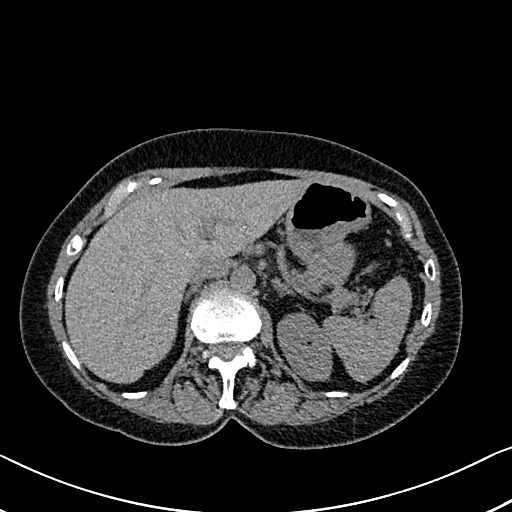
[im 13/161  lung]
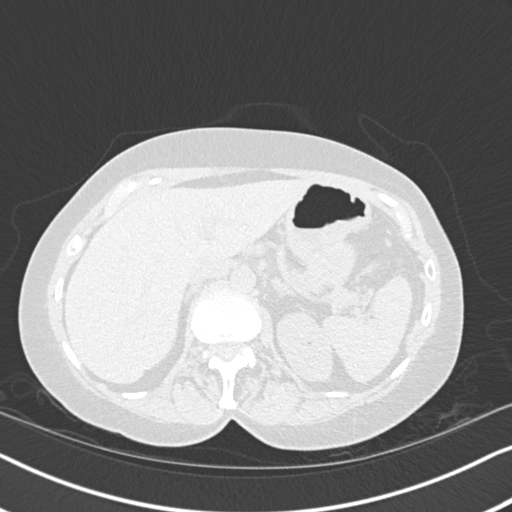
[im 25/161  lung]
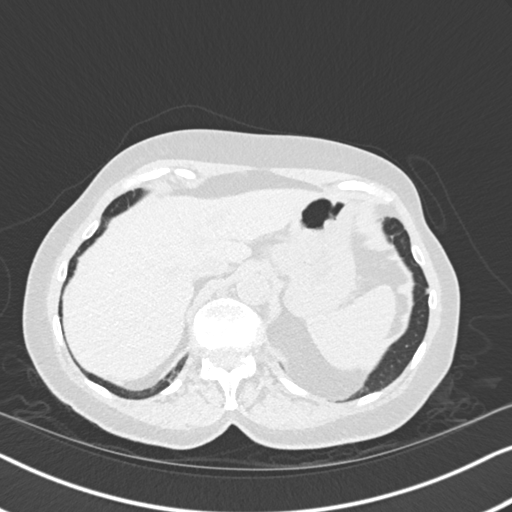
[im 37/161  lung]
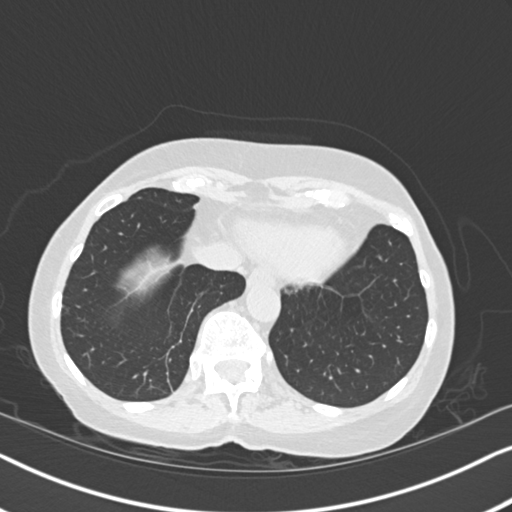
[im 50/161  lung]
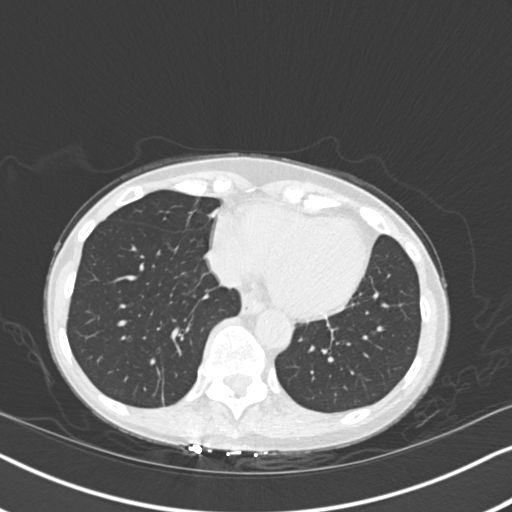
[im 62/161  mediastinal]
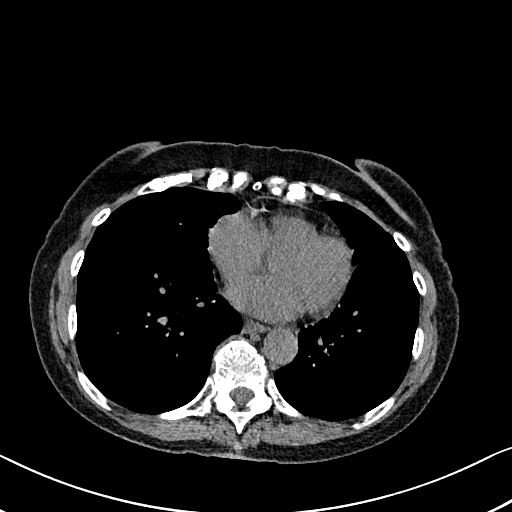
[im 62/161  lung]
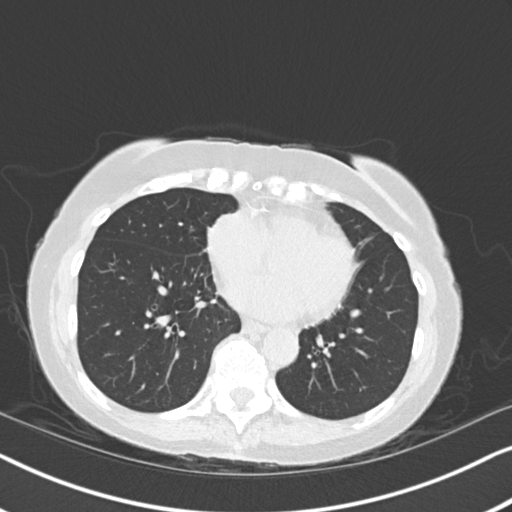
[im 74/161  lung]
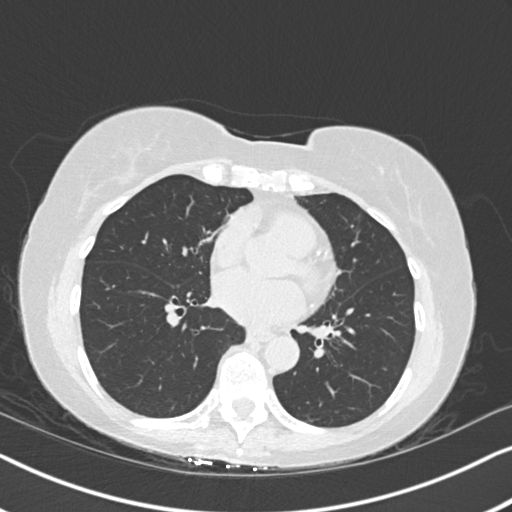
[im 87/161  lung]
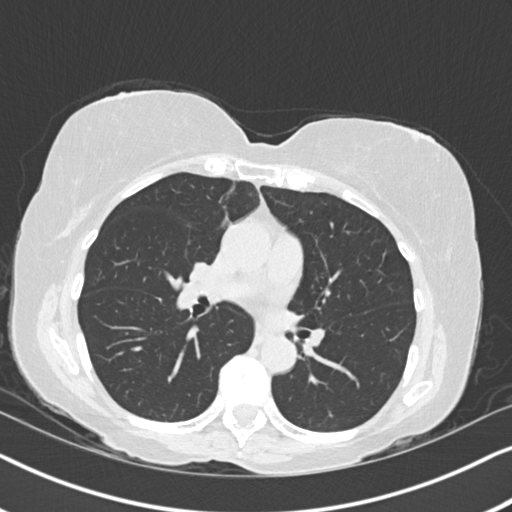
[im 99/161  lung]
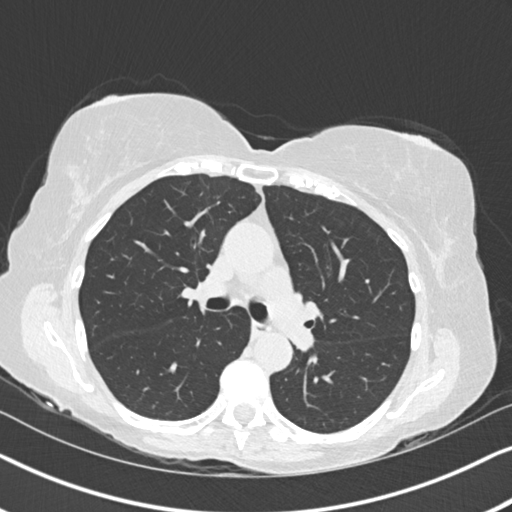
[im 111/161  mediastinal]
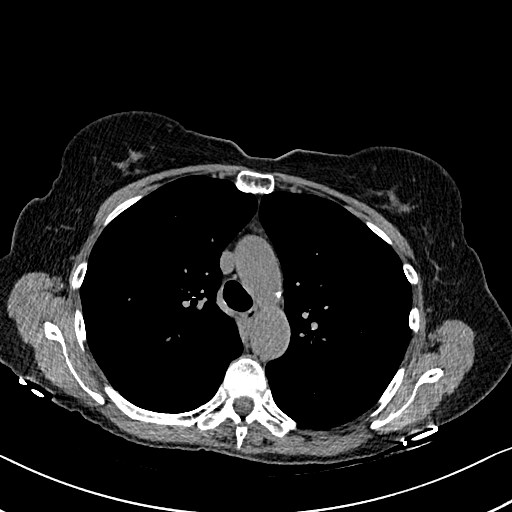
[im 111/161  lung]
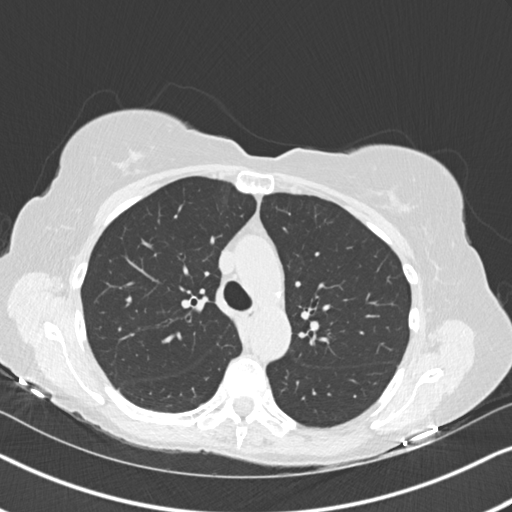
[im 124/161  lung]
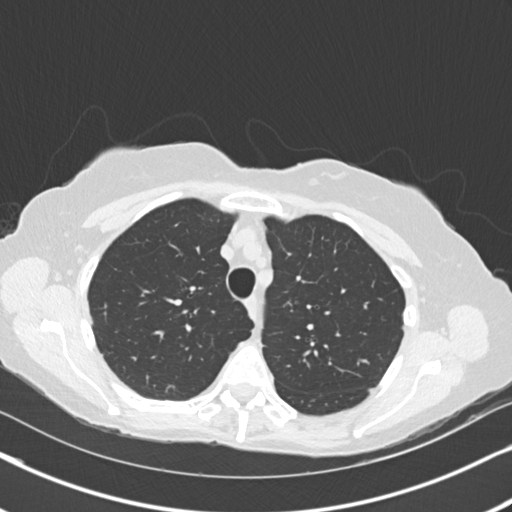
[im 136/161  lung]
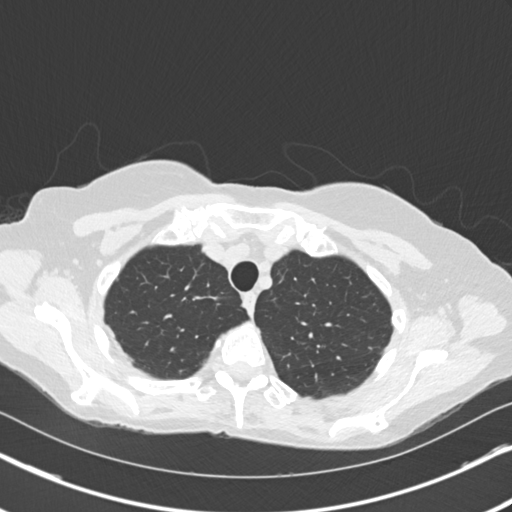
[im 148/161  lung]
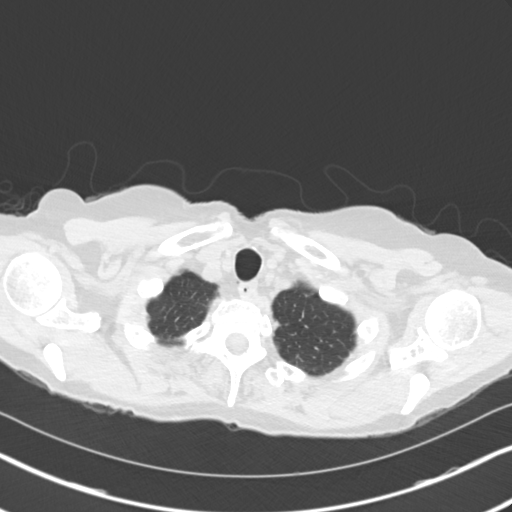

[Series 5: coronal · coronal · 0.59mm/px · 3 of 116 slices shown]
[im 24/116  lung]
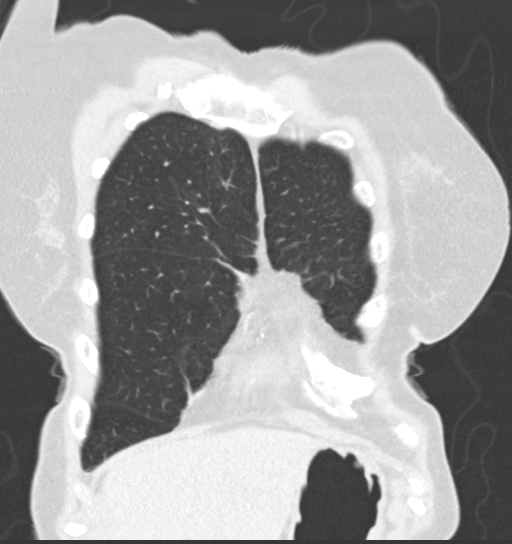
[im 47/116  lung]
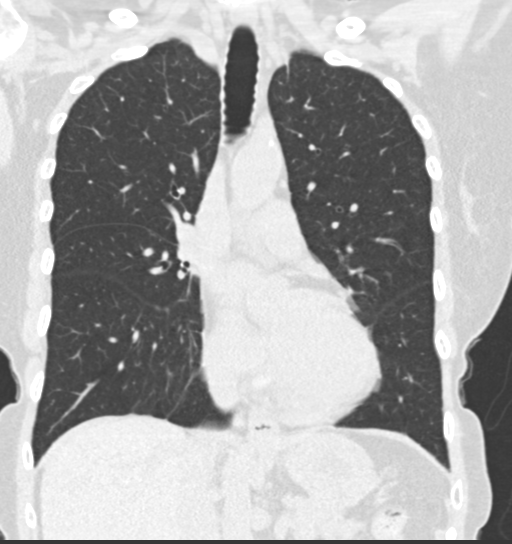
[im 70/116  lung]
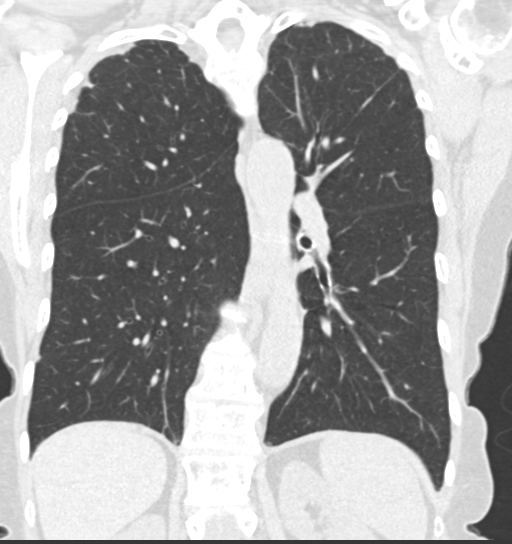

[15 of 36 positions shown; findings below may reference images not displayed]

FINDINGS: Cardiovascular: Coronary artery calcifications are seen. There is
ectasia of ascending thoracic aorta measuring 3.6 cm

Mediastinum/Nodes: No significant lymphadenopathy seen.

Lungs/Pleura: There is no focal pulmonary consolidation. There are
small linear densities in the lingula and right middle lobe
suggesting scarring or subsegmental atelectasis. There are few small
noncalcified nodules in both lungs, more so in the right lung.
Largest of these nodules measures 7 mm in size in the posterior
upper aspect of right lower lobe. Area of this 7 mm nodule was not
included in the previous examination limiting comparison. Rest of
the visualized nodular densities in the lung fields appear stable.

Upper Abdomen: There is 6 mm calculus in the midportion of left
kidney.

Musculoskeletal: Unremarkable.
IMPRESSION: There are scattered nodular densities in both lungs, more so on the
right side measuring up to 7 mm in size in the right lower lobe.
Area of this 7 mm nodule in right lower lobe was not included in the
previous examination limiting evaluation for interval change.
Follow-up CT chest in 6 months may be considered.

There is no focal pulmonary consolidation. No significant
lymphadenopathy seen.

Coronary artery calcifications are seen. There is 6 mm left renal
stone.

## 2023-07-07 DIAGNOSIS — Z1330 Encounter for screening examination for mental health and behavioral disorders, unspecified: Secondary | ICD-10-CM | POA: Diagnosis not present

## 2023-07-07 DIAGNOSIS — I480 Paroxysmal atrial fibrillation: Secondary | ICD-10-CM | POA: Diagnosis not present

## 2023-07-16 ENCOUNTER — Other Ambulatory Visit: Payer: Self-pay | Admitting: Cardiology

## 2023-07-21 DIAGNOSIS — I480 Paroxysmal atrial fibrillation: Secondary | ICD-10-CM | POA: Diagnosis not present

## 2023-07-24 ENCOUNTER — Encounter (HOSPITAL_COMMUNITY): Payer: Self-pay

## 2023-07-24 ENCOUNTER — Inpatient Hospital Stay (HOSPITAL_COMMUNITY): Admit: 2023-07-24 | Payer: Medicare Other | Admitting: Cardiology

## 2023-07-24 SURGERY — LEFT ATRIAL APPENDAGE OCCLUSION
Anesthesia: General

## 2023-07-29 DIAGNOSIS — X32XXXD Exposure to sunlight, subsequent encounter: Secondary | ICD-10-CM | POA: Diagnosis not present

## 2023-07-29 DIAGNOSIS — L570 Actinic keratosis: Secondary | ICD-10-CM | POA: Diagnosis not present

## 2023-07-29 DIAGNOSIS — L820 Inflamed seborrheic keratosis: Secondary | ICD-10-CM | POA: Diagnosis not present

## 2023-08-18 DIAGNOSIS — I480 Paroxysmal atrial fibrillation: Secondary | ICD-10-CM | POA: Diagnosis not present

## 2023-08-18 DIAGNOSIS — Z1330 Encounter for screening examination for mental health and behavioral disorders, unspecified: Secondary | ICD-10-CM | POA: Diagnosis not present

## 2023-09-04 DIAGNOSIS — Z1231 Encounter for screening mammogram for malignant neoplasm of breast: Secondary | ICD-10-CM | POA: Diagnosis not present

## 2023-09-04 DIAGNOSIS — K080 Exfoliation of teeth due to systemic causes: Secondary | ICD-10-CM | POA: Diagnosis not present

## 2023-11-03 DIAGNOSIS — H524 Presbyopia: Secondary | ICD-10-CM | POA: Diagnosis not present

## 2023-11-03 DIAGNOSIS — H04123 Dry eye syndrome of bilateral lacrimal glands: Secondary | ICD-10-CM | POA: Diagnosis not present

## 2023-12-03 DIAGNOSIS — M8580 Other specified disorders of bone density and structure, unspecified site: Secondary | ICD-10-CM | POA: Diagnosis not present

## 2023-12-03 DIAGNOSIS — D6869 Other thrombophilia: Secondary | ICD-10-CM | POA: Diagnosis not present

## 2023-12-07 ENCOUNTER — Other Ambulatory Visit: Payer: Self-pay | Admitting: Cardiology

## 2023-12-07 DIAGNOSIS — I480 Paroxysmal atrial fibrillation: Secondary | ICD-10-CM

## 2023-12-09 NOTE — Telephone Encounter (Signed)
 Pt last saw Dr Marven Slimmer 06/04/23, last labs 03/21/23 Creat 0.75, age 78, weight 63.8kg, based on specified criteria pt is on appropriate dosage of Eliquis  5mg  BID for afib.  Will refill rx.

## 2023-12-13 DIAGNOSIS — E7800 Pure hypercholesterolemia, unspecified: Secondary | ICD-10-CM | POA: Diagnosis not present

## 2023-12-13 DIAGNOSIS — I480 Paroxysmal atrial fibrillation: Secondary | ICD-10-CM | POA: Diagnosis not present

## 2024-01-09 ENCOUNTER — Other Ambulatory Visit: Payer: Self-pay | Admitting: Family Medicine

## 2024-01-09 DIAGNOSIS — R911 Solitary pulmonary nodule: Secondary | ICD-10-CM

## 2024-01-12 DIAGNOSIS — E7800 Pure hypercholesterolemia, unspecified: Secondary | ICD-10-CM | POA: Diagnosis not present

## 2024-01-12 DIAGNOSIS — I480 Paroxysmal atrial fibrillation: Secondary | ICD-10-CM | POA: Diagnosis not present

## 2024-01-19 NOTE — Progress Notes (Unsigned)
 Cardiology Office Note:    Date:  01/20/2024   ID:  [name], DOB 09-01-1945, MRN [id number]  PCP:  Arloa Elsie SAUNDERS, MD  Cardiologist:  Lonni LITTIE Nanas, MD  Electrophysiologist:  OLE ONEIDA HOLTS, MD   Referring MD: Arloa Elsie SAUNDERS, MD   Chief Complaint  Patient presents with   Atrial Fibrillation    History of Present Illness:    [name] is a 78 y.o. female with a hx of hyperlipidemia who presents for follow-up.  She was referred by Dr. Arloa for evaluation of chest pain and palpitations, initially seen on 09/11/2020.  She reports that she started having chest pain in January 2022.  States that it occurred for 3 to 4 days and then resolved.  Describes dull aching pain in center of her chest, would last for 2 to 3 hours.  Unclear what brings it on, and has not noted any relationship with exertion.  She denies any shortness of breath but reports she feels tired and weak.  Also reports intermittent lightheadedness but denies any syncope.  Reports she is having episodes few times per week where she feels like her heart is racing.  Reports lightheadedness during episodes.  She denies any lower extremity edema.  Reports BP has been labile, as low as 90s over 50s and as high as 140s over 80s.  She has been taking propranolol but feels very fatigued on the medication.  No smoking history.  Family history includes father had valve replacement in 21s.  Lexiscan  Myoview  on 09/21/2020 showed normal perfusion, baseline ST depressions in inferior leads that worsened during stress, EF 74%.  Echocardiogram on 10/03/2020 showed normal biventricular function, no significant valvular disease.  Zio patch x7 days on 10/03/2020 showed 72 episodes of SVT, longest lasting 26 seconds.  Calcium  score on 01/12/2021 was 187 (71st percentile).  Also with multiple small pulmonary nodules.  Zio patch x14 days on 04/03/2022 showed 7% atrial fibrillation burden, longest episode lasting 9 hours with average  rate 70 bpm.  Echocardiogram 03/25/2022 showed normal biventricular function, no significant valvular disease.  She was referred to EP and underwent A-fib ablation with Dr. HOLTS on 01/21/2023.  Zio patch x 14 days 03/2023 showed 1058 episodes of SVT with longest lasting 12 minutes 42 seconds with average rate 106 bpm, less than 1% A-fib burden with longest episode lasting 2 hours 46 seconds with average rate 68 bpm, 1 pause lasting 3 seconds  Since last clinic visit, she reports she is doing okay.  She reported palpitations about twice per week, lasts short duration. Denies any chest pain, dyspnea, lightheadedness, syncope, lower extremity edema.  Denies any bleeding on Eliquis .  Past Medical History:  Diagnosis Date   Allergy    Arthritis    oa   Cataract of both eyes    Cervical dysplasia    Diarrhea    off and on last 3 months 1 episode today   Dry eyes    Fluid retention    slight leg swelling at times   GERD (gastroesophageal reflux disease)    Hypertension    Legally blind in left eye, as defined in [place]     since birth    Low back pain    Lumbar herniated disc    Numbness and tingling    feet bilat    Osteopenia 06/2015   T score -1.7   Syncope    none recent   Urinary frequency    Urinary hesitancy  Wears glasses     Past Surgical History:  Procedure Laterality Date   ATRIAL FIBRILLATION ABLATION N/A 01/21/2023   Procedure: ATRIAL FIBRILLATION ABLATION;  Surgeon: Cindie Ole DASEN, MD;  Location: [facility];  Service: Cardiovascular;  Laterality: N/A;   COLONOSCOPY     LUMBAR LAMINECTOMY     2009 secondary to ruptured disc;    TOTAL HIP ARTHROPLASTY Left 03/25/2018   Procedure: LEFT TOTAL HIP ARTHROPLASTY ANTERIOR APPROACH;  Surgeon: Melodi Lerner, MD;  Location: [facility];  Service: Orthopedics;  Laterality: Left;   TOTAL HIP ARTHROPLASTY Right 05/07/2021   Procedure: TOTAL HIP ARTHROPLASTY ANTERIOR APPROACH;  Surgeon: Melodi Lerner, MD;  Location: [facility];   Service: Orthopedics;  Laterality: Right;   TOTAL KNEE ARTHROPLASTY Left 08/02/2015   Procedure: LEFT TOTAL KNEE ARTHROPLASTY;  Surgeon: Lerner Melodi, MD;  Location: [facility];  Service: Orthopedics;  Laterality: Left;   UPPER GASTROINTESTINAL ENDOSCOPY     VAGINAL HYSTERECTOMY  1980 or 1981   partial    Current Medications: Current Meds  Medication Sig   ALPRAZolam  (XANAX ) 0.25 MG tablet Take 0.25 mg by mouth 2 (two) times daily as needed for anxiety.    apixaban  (ELIQUIS ) 5 MG TABS tablet Take 1 tablet by mouth twice daily   apixaban  (ELIQUIS ) 5 MG TABS tablet Take 1 tablet (5 mg total) by mouth 2 (two) times daily.   Ascorbic Acid (VITAMIN C) 1000 MG tablet Take 1,000 mg by mouth daily.   atorvastatin  (LIPITOR) 20 MG tablet Take 1 tablet by mouth once daily   Bacillus Coagulans-Inulin (ALIGN PREBIOTIC-PROBIOTIC PO) Take 1 capsule by mouth daily.   Calcium  Carb-Cholecalciferol (CALCIUM  600+D3 PO) Take 1 tablet by mouth 2 (two) times daily.   Cholecalciferol (VITAMIN D3) 50 MCG (2000 UT) capsule Take 2,000 Units by mouth every evening.   estradiol  (ESTRACE ) 0.5 MG tablet Take 0.5 mg by mouth at bedtime.   metoprolol  succinate (TOPROL -XL) 25 MG 24 hr tablet Take 1/2 (one-half) tablet by mouth once daily   Multiple Vitamin (MULTIVITAMIN WITH MINERALS) TABS tablet Take 1 tablet by mouth daily. Senior Multivitamin   Omega-3 Fatty Acids (FISH OIL) 1000 MG CAPS Take 1,000 mg by mouth 2 (two) times daily.   pantoprazole  (PROTONIX ) 40 MG tablet Take 40 mg by mouth daily.   traZODone (DESYREL) 50 MG tablet Take 50 mg by mouth at bedtime.   zinc gluconate 50 MG tablet Take 50 mg by mouth daily.     Allergies:   Patient has no known allergies.   Social History   Socioeconomic History   Marital status: Married    Spouse name: Not on file   Number of children: Not on file   Years of education: Not on file   Highest education level: Not on file  Occupational History   Not on file  Tobacco Use    Smoking status: Never   Smokeless tobacco: Never  Vaping Use   Vaping status: Never Used  Substance and Sexual Activity   Alcohol use: No    Alcohol/week: 0.0 standard drinks of alcohol   Drug use: No   Sexual activity: Yes    Birth control/protection: Surgical    Comment: Hyst-1st intercourse 74 yo-1 partner  Other Topics Concern   Not on file  Social History Narrative   Not on file   Social Drivers of Health   Financial Resource Strain: Not on file  Food Insecurity: Not on file  Transportation Needs: Not on file  Physical Activity:  Not on file  Stress: Not on file  Social Connections: Not on file     Family History: The patient's family history includes Cancer in her father; Colon cancer in her father; Diabetes in her brother and father; Heart disease in her father; Hypertension in her brother. There is no history of Esophageal cancer, Rectal cancer, or Stomach cancer.  ROS:   Please see the history of present illness.     All other systems reviewed and are negative.  EKGs/Labs/Other Studies Reviewed:    The following studies were reviewed today:  Echo 03/22:    1. Left ventricular ejection fraction, by estimation, is 60 to 65%. The  left ventricle has normal function. The left ventricle has no regional  wall motion abnormalities. Left ventricular diastolic parameters are  indeterminate.   2. Right ventricular systolic function is normal. The right ventricular  size is normal. There is normal pulmonary artery systolic pressure.   3. The mitral valve is normal in structure. No evidence of mitral valve  regurgitation. No evidence of mitral stenosis.   4. The aortic valve is normal in structure. Aortic valve regurgitation is  not visualized. No aortic stenosis is present.   Long term monitor 03/22:  Study Highlights   72 episodes of SVT, longest lasting 26 seconds with rate 112 bpm     Patch Wear Time:  7 days and 2 hours (2022-03-11T11:48:10-498 to  2022-03-18T14:57:26-0400)   Patient had a min HR of 53 bpm, max HR of 144 bpm, and avg HR of 69 bpm. Predominant underlying rhythm was Sinus Rhythm. 72 Supraventricular Tachycardia runs occurred, the run with the fastest interval lasting 18 beats with a max rate of 144 bpm, the longest lasting 26.3 secs with an avg rate of 112 bpm. Isolated SVEs were rare (<1.0%), SVE Couplets were rare (<1.0%), and SVE Triplets were rare (<1.0%). Isolated VEs were rare (<1.0%), and no VE Couplets or VE Triplets were present. 2 patient triggered events, corresponding to sinus rhythm +/- PACs  Lexiscan  09/21/20  Study Highlights ST depressions in inferior leads at rest, worsened during stress, along with <75mm horizontal ST depressions in V5/6 The left ventricular ejection fraction is hyperdynamic (>65%). Nuclear stress EF: 74%. The study is normal. This is a low risk study.   EKG:  01/20/24: NSR, rate 79 03/21/2023: Normal sinus rhythm, rate 72, low voltage 09/02/22: sinus bradycardia, rate 57, poor R wave progression 05/09/22: Normal sinus rhythm, rate 75, low voltage, no ST abnormalities 03/06/2022: Normal sinus rhythm, rate 75, low voltage 08/29/21:NSR, rate 61, poor R wave progression 02/01/21: sinus rhythm, rate 54, low voltage, no st abnormalities 12/13/2020- No EKG ordered  09/11/2020- The ekg ordered demonstrates normal sinus rhythm, rate 71, low voltage, no ST abnormalities  Recent Labs: 03/21/2023: BUN 10; Creatinine, Ser 0.75; Hemoglobin 14.0; Platelets 218; Potassium 4.6; Sodium 140  Recent Lipid Panel    Component Value Date/Time   CHOL 124 08/29/2021 1039   TRIG 76 08/29/2021 1039   HDL 63 08/29/2021 1039   CHOLHDL 2.0 08/29/2021 1039   CHOLHDL 3 09/15/2009 0954   VLDL 14.4 09/15/2009 0954   LDLCALC 46 08/29/2021 1039   LDLDIRECT 136.7 09/15/2009 0954    Physical Exam:    VS:  BP 129/81 (BP Location: Left Arm, Patient Position: Sitting, Cuff Size: Normal)   Pulse 79   Ht 5' 4 (1.626 m)    Wt 140 lb 3.2 oz (63.6 kg)   LMP 07/16/1979   SpO2 95%   BMI  24.07 kg/m     Wt Readings from Last 3 Encounters:  01/20/24 140 lb 3.2 oz (63.6 kg)  06/04/23 140 lb 9.6 oz (63.8 kg)  03/21/23 142 lb 6.4 oz (64.6 kg)     HZW:Tzoo nourished, well developed in no acute distress HEENT: Normal NECK: No JVD; No carotid bruits LYMPHATICS: No lymphadenopathy CARDIAC:RRR, no murmurs, rubs, gallops RESPIRATORY:  Clear to auscultation without rales, wheezing or rhonchi  ABDOMEN: Soft, non-tender, non-distended MUSCULOSKELETAL:  No edema; No deformity  SKIN: Warm and dry NEUROLOGIC:  Alert and oriented x 3 PSYCHIATRIC:  Normal affect   ASSESSMENT:    1. Paroxysmal atrial fibrillation (HCC)   2. SVT (supraventricular tachycardia) (HCC)   3. Chest pain of uncertain etiology   4. Hyperlipidemia, unspecified hyperlipidemia type      PLAN:    Atrial fibrillation:  Zio patch x14 days on 04/03/2022 showed 7% atrial fibrillation burden, longest episode lasting 9 hours with average rate 70 bpm.  CHA2DS2-VASc score 3 (age x2, female).  Echocardiogram 03/25/2022 showed normal biventricular function, no significant valvular disease.  She was referred to EP and underwent A-fib ablation with Dr. Cindie on 01/21/2023.  Zio patch x 14 days 03/2023 showed 1058 episodes of SVT with longest lasting 12 minutes 42 seconds with average rate 106 bpm, less than 1% A-fib burden with longest episode lasting 2 hours 46 seconds with average rate 68 bpm, 1 pause lasting 3 seconds -Continue Eliquis  5 mg twice daily -Continue Toprol -XL 12.5 mg daily  Chest pain: Atypical in description but does have CAD risk factors (age, hyperlipidemia).  Lexiscan  Myoview  on 09/21/2020 showed normal perfusion, baseline ST depressions in inferior leads that worsened during stress, EF 74%.  Echocardiogram on 10/03/2020 showed normal biventricular function, no significant valvular disease.   -No further cardiac work-up recommended  SVT: Zio  patch x7 days on 10/03/2020 showed 72 episodes of SVT, longest lasting 26 seconds.  She has been taking as needed propranolol, stopped propranolol and started Toprol -XL 25 mg daily.  Dose reduced to 12.5 mg daily as reported heart rates in 40s at home and intermittent lightheadedness.  Zio patch x 14 days 03/2023 showed 1058 episodes of SVT with longest lasting 12 minutes 42 seconds with average rate 106 bpm, less than 1% A-fib burden with longest episode lasting 2 hours 46 seconds with average rate 68 bpm, 1 pause lasting 3 seconds  Hyperlipidemia: LDL 98 on 12/13/20.  Calcium  score on 01/12/2021 was 187 (71st percentile).  Started atorvastatin  20 mg daily.  LDL 55 on 04/2023  Pulmonary nodules: Multiple small pulmonary nodules bilaterally on calcium  score.  Largest measures 6 mm.  Repeat chest CT was recommended at 6 months.  Repeat chest CT on 07/13/2021 showed 7 mm nodule in right lower lobe that was not included on previous CT. Stable chest CT 01/11/2022, optional follow-up at 18 to 24 months; she is scheduled for repeat CT chest later this month  RTC in 6 months    Medication Adjustments/Labs and Tests Ordered: Current medicines are reviewed at length with the patient today.  Concerns regarding medicines are outlined above.  Orders Placed This Encounter  Procedures   EKG 12-Lead    Meds ordered this encounter  Medications   apixaban  (ELIQUIS ) 5 MG TABS tablet    Sig: Take 1 tablet (5 mg total) by mouth 2 (two) times daily.    Dispense:  28 tablet    Refill:  0    Lot Number?:   [id number]  Expiration Date?:   02/11/2025    Quantity:   28     Patient Instructions  Medication Instructions:  Continue current medications *If you need a refill on your cardiac medications before your next appointment, please call your pharmacy*  Lab Work: none If you have labs (blood work) drawn today and your tests are completely normal, you will receive your results only by: MyChart Message (if you  have MyChart) OR A paper copy in the mail If you have any lab test that is abnormal or we need to change your treatment, we will call you to review the results.  Testing/Procedures: none  Follow-Up: At [facility] you and your health needs are our priority.  As part of our continuing mission to provide you with exceptional heart care, our providers are all part of one team.  This team includes your primary Cardiologist (physician) and Advanced Practice Providers or APPs (Physician Assistants and Nurse Practitioners) who all work together to provide you with the care you need, when you need it.  Your next appointment:   6 month(s)  Provider:   Lonni LITTIE Nanas, MD    We recommend signing up for the patient portal called MyChart.  Sign up information is provided on this After Visit Summary.  MyChart is used to connect with patients for Virtual Visits (Telemedicine).  Patients are able to view lab/test results, encounter notes, upcoming appointments, etc.  Non-urgent messages can be sent to your provider as well.   To learn more about what you can do with MyChart, go to [email or web address].   Other Instructions none         Signed, Lonni LITTIE Nanas, MD  01/20/2024 5:41 PM    [facility]

## 2024-01-20 ENCOUNTER — Encounter: Payer: Self-pay | Admitting: Cardiology

## 2024-01-20 ENCOUNTER — Ambulatory Visit: Attending: Cardiology | Admitting: Cardiology

## 2024-01-20 VITALS — BP 129/81 | HR 79 | Ht 64.0 in | Wt 140.2 lb

## 2024-01-20 DIAGNOSIS — E785 Hyperlipidemia, unspecified: Secondary | ICD-10-CM

## 2024-01-20 DIAGNOSIS — R079 Chest pain, unspecified: Secondary | ICD-10-CM

## 2024-01-20 DIAGNOSIS — I471 Supraventricular tachycardia, unspecified: Secondary | ICD-10-CM | POA: Diagnosis not present

## 2024-01-20 DIAGNOSIS — I480 Paroxysmal atrial fibrillation: Secondary | ICD-10-CM | POA: Diagnosis not present

## 2024-01-20 MED ORDER — APIXABAN 5 MG PO TABS: 5.0000 mg | ORAL_TABLET | Freq: Two times a day (BID) | ORAL | 0 refills | Status: AC

## 2024-01-20 NOTE — Patient Instructions (Addendum)
 Medication Instructions:  Continue current medications *If you need a refill on your cardiac medications before your next appointment, please call your pharmacy*  Lab Work: none If you have labs (blood work) drawn today and your tests are completely normal, you will receive your results only by: MyChart Message (if you have MyChart) OR A paper copy in the mail If you have any lab test that is abnormal or we need to change your treatment, we will call you to review the results.  Testing/Procedures: none  Follow-Up: At [facility], you and your health needs are our priority.  As part of our continuing mission to provide you with exceptional heart care, our providers are all part of one team.  This team includes your primary Cardiologist (physician) and Advanced Practice Providers or APPs (Physician Assistants and Nurse Practitioners) who all work together to provide you with the care you need, when you need it.  Your next appointment:   6 month(s)  Provider:   Wendie Hamburg, MD    We recommend signing up for the patient portal called "MyChart".  Sign up information is provided on this After Visit Summary.  MyChart is used to connect with patients for Virtual Visits (Telemedicine).  Patients are able to view lab/test results, encounter notes, upcoming appointments, etc.  Non-urgent messages can be sent to your provider as well.   To learn more about what you can do with MyChart, go to [email or web address].   Other Instructions none

## 2024-01-21 ENCOUNTER — Encounter: Payer: Self-pay | Admitting: Family Medicine

## 2024-01-23 ENCOUNTER — Ambulatory Visit
Admission: RE | Admit: 2024-01-23 | Discharge: 2024-01-23 | Disposition: A | Discharge status: Home / Self Care | Source: Ambulatory Visit | Attending: Family Medicine | Admitting: Family Medicine

## 2024-01-23 DIAGNOSIS — R918 Other nonspecific abnormal finding of lung field: Secondary | ICD-10-CM | POA: Diagnosis not present

## 2024-01-23 DIAGNOSIS — R911 Solitary pulmonary nodule: Secondary | ICD-10-CM

## 2024-01-27 DIAGNOSIS — L570 Actinic keratosis: Secondary | ICD-10-CM | POA: Diagnosis not present

## 2024-01-27 DIAGNOSIS — X32XXXD Exposure to sunlight, subsequent encounter: Secondary | ICD-10-CM | POA: Diagnosis not present

## 2024-02-11 NOTE — Progress Notes (Unsigned)
  Electrophysiology Office Note:   Date:  02/11/2024  ID:  [name], DOB Dec 31, 1945, MRN [id number]  Primary Cardiologist: Lonni LITTIE Nanas, MD Electrophysiologist: OLE ONEIDA HOLTS, MD   Electrophysiologist:  OLE ONEIDA HOLTS, MD  {Click to update primary MD,subspecialty MD or APP then REFRESH:1}    History of Present Illness:   [name] is a 78 y.o. female with h/o PAF s/p ablation, HLD,  and hypotension seen today for routine electrophysiology followup.   Since last being seen in our clinic the patient reports doing ***.  she denies chest pain, palpitations, dyspnea, PND, orthopnea, nausea, vomiting, dizziness, syncope, edema, weight gain, or early satiety.   Review of systems complete and found to be negative unless listed in HPI.   EP Information / Studies Reviewed:    EKG is not ordered today. EKG from 01/20/2024 reviewed which showed NSR at 79 bpm       Arrhythmia/Device History No specialty comments available.   Physical Exam:   VS:  LMP 07/16/1979    Wt Readings from Last 3 Encounters:  01/20/24 140 lb 3.2 oz (63.6 kg)  06/04/23 140 lb 9.6 oz (63.8 kg)  03/21/23 142 lb 6.4 oz (64.6 kg)     GEN: No acute distress NECK: No JVD; No carotid bruits CARDIAC: {EPRHYTHM:28826}, no murmurs, rubs, gallops RESPIRATORY:  Clear to auscultation without rales, wheezing or rhonchi  ABDOMEN: Soft, non-tender, non-distended EXTREMITIES:  {EDEMA LEVEL:28147::No} edema; No deformity   ASSESSMENT AND PLAN:    Persistent AF SVT Stable by symptoms Continue eliquis  5 mg BID for CHA2DS2/VASc of at least 4 Continue toprol  12.5 mg daily   Secondary hypercoagulable state Pt on Eliquis  as above    Atypical chest pain Stable echo and myoview   HLD Continue statin   {Click here to Review PMH, Prob List, Meds, Allergies, SHx, FHx  :1}   Follow up with {EPMDS:28135::EP Team} {EPFOLLOW UP:28173}  Signed, Ozell Prentice Passey, PA-C

## 2024-02-12 ENCOUNTER — Encounter: Payer: Self-pay | Admitting: Student

## 2024-02-12 ENCOUNTER — Ambulatory Visit: Attending: Student | Admitting: Student

## 2024-02-12 VITALS — BP 106/68 | HR 66 | Ht 64.0 in | Wt 140.0 lb

## 2024-02-12 DIAGNOSIS — I471 Supraventricular tachycardia, unspecified: Secondary | ICD-10-CM

## 2024-02-12 DIAGNOSIS — D6869 Other thrombophilia: Secondary | ICD-10-CM | POA: Diagnosis not present

## 2024-02-12 DIAGNOSIS — I480 Paroxysmal atrial fibrillation: Secondary | ICD-10-CM | POA: Diagnosis not present

## 2024-02-12 DIAGNOSIS — E7800 Pure hypercholesterolemia, unspecified: Secondary | ICD-10-CM | POA: Diagnosis not present

## 2024-02-12 NOTE — Patient Instructions (Signed)
 Medication Instructions:  Your physician recommends that you continue on your current medications as directed. Please refer to the Current Medication list given to you today.  *If you need a refill on your cardiac medications before your next appointment, please call your pharmacy*  Lab Work: None ordered If you have labs (blood work) drawn today and your tests are completely normal, you will receive your results only by: MyChart Message (if you have MyChart) OR A paper copy in the mail If you have any lab test that is abnormal or we need to change your treatment, we will call you to review the results.  Follow-Up: At [facility], you and your health needs are our priority.  As part of our continuing mission to provide you with exceptional heart care, our providers are all part of one team.  This team includes your primary Cardiologist (physician) and Advanced Practice Providers or APPs (Physician Assistants and Nurse Practitioners) who all work together to provide you with the care you need, when you need it.  Your next appointment:   6 month(s)  Provider:   Bambi Lever "Michaelle Adolphus, PA-C

## 2024-03-07 ENCOUNTER — Other Ambulatory Visit: Payer: Self-pay | Admitting: Student

## 2024-03-14 DIAGNOSIS — E7800 Pure hypercholesterolemia, unspecified: Secondary | ICD-10-CM | POA: Diagnosis not present

## 2024-03-14 DIAGNOSIS — I480 Paroxysmal atrial fibrillation: Secondary | ICD-10-CM | POA: Diagnosis not present

## 2024-03-16 DIAGNOSIS — K080 Exfoliation of teeth due to systemic causes: Secondary | ICD-10-CM | POA: Diagnosis not present

## 2024-04-02 ENCOUNTER — Other Ambulatory Visit: Payer: Self-pay | Admitting: Cardiology

## 2024-04-13 DIAGNOSIS — I480 Paroxysmal atrial fibrillation: Secondary | ICD-10-CM | POA: Diagnosis not present

## 2024-04-13 DIAGNOSIS — E7800 Pure hypercholesterolemia, unspecified: Secondary | ICD-10-CM | POA: Diagnosis not present

## 2024-04-28 DIAGNOSIS — E7800 Pure hypercholesterolemia, unspecified: Secondary | ICD-10-CM | POA: Diagnosis not present

## 2024-05-03 DIAGNOSIS — Z0000 Encounter for general adult medical examination without abnormal findings: Secondary | ICD-10-CM | POA: Diagnosis not present

## 2024-05-03 DIAGNOSIS — K219 Gastro-esophageal reflux disease without esophagitis: Secondary | ICD-10-CM | POA: Diagnosis not present

## 2024-05-03 DIAGNOSIS — F5101 Primary insomnia: Secondary | ICD-10-CM | POA: Diagnosis not present

## 2024-05-03 DIAGNOSIS — F419 Anxiety disorder, unspecified: Secondary | ICD-10-CM | POA: Diagnosis not present

## 2024-05-03 DIAGNOSIS — E7800 Pure hypercholesterolemia, unspecified: Secondary | ICD-10-CM | POA: Diagnosis not present

## 2024-05-03 DIAGNOSIS — Z23 Encounter for immunization: Secondary | ICD-10-CM | POA: Diagnosis not present

## 2024-05-14 DIAGNOSIS — E7800 Pure hypercholesterolemia, unspecified: Secondary | ICD-10-CM | POA: Diagnosis not present

## 2024-05-14 DIAGNOSIS — I480 Paroxysmal atrial fibrillation: Secondary | ICD-10-CM | POA: Diagnosis not present

## 2024-06-07 ENCOUNTER — Other Ambulatory Visit: Payer: Self-pay | Admitting: Cardiology

## 2024-06-07 DIAGNOSIS — I480 Paroxysmal atrial fibrillation: Secondary | ICD-10-CM

## 2024-06-08 NOTE — Telephone Encounter (Signed)
 Prescription refill request for Eliquis  received. Indication:afib Last office visit:7/25 Scr: 0.73  2025 Age:78 Weight:63.5  kg  Prescription refilled

## 2024-06-13 DIAGNOSIS — E7800 Pure hypercholesterolemia, unspecified: Secondary | ICD-10-CM | POA: Diagnosis not present

## 2024-06-13 DIAGNOSIS — I480 Paroxysmal atrial fibrillation: Secondary | ICD-10-CM | POA: Diagnosis not present

## 2024-07-31 ENCOUNTER — Encounter: Payer: Self-pay | Admitting: Gastroenterology

## 2024-08-26 ENCOUNTER — Ambulatory Visit: Admitting: Gastroenterology

## 2024-12-07 ENCOUNTER — Ambulatory Visit: Admitting: Cardiology
